# Patient Record
Sex: Female | Born: 1965 | Race: White | Hispanic: No | Marital: Married | Smoking: Never smoker
Health system: Southern US, Community
[De-identification: ages and names within clinical notes are randomized; demographics above are authoritative.]

## PROBLEM LIST (undated history)

## (undated) DIAGNOSIS — T7840XA Allergy, unspecified, initial encounter: Secondary | ICD-10-CM

## (undated) DIAGNOSIS — F419 Anxiety disorder, unspecified: Secondary | ICD-10-CM

## (undated) DIAGNOSIS — R87610 Atypical squamous cells of undetermined significance on cytologic smear of cervix (ASC-US): Secondary | ICD-10-CM

## (undated) DIAGNOSIS — K2270 Barrett's esophagus without dysplasia: Secondary | ICD-10-CM

## (undated) DIAGNOSIS — K219 Gastro-esophageal reflux disease without esophagitis: Secondary | ICD-10-CM

## (undated) DIAGNOSIS — R87619 Unspecified abnormal cytological findings in specimens from cervix uteri: Secondary | ICD-10-CM

## (undated) DIAGNOSIS — E785 Hyperlipidemia, unspecified: Secondary | ICD-10-CM

## (undated) HISTORY — DX: Atypical squamous cells of undetermined significance on cytologic smear of cervix (ASC-US): R87.610

## (undated) HISTORY — PX: APPENDECTOMY: SHX54

## (undated) HISTORY — DX: Anxiety disorder, unspecified: F41.9

## (undated) HISTORY — DX: Gastro-esophageal reflux disease without esophagitis: K21.9

## (undated) HISTORY — DX: Allergy, unspecified, initial encounter: T78.40XA

## (undated) HISTORY — DX: Barrett's esophagus without dysplasia: K22.70

## (undated) HISTORY — DX: Unspecified abnormal cytological findings in specimens from cervix uteri: R87.619

## (undated) HISTORY — DX: Hyperlipidemia, unspecified: E78.5

---

## 2008-03-16 ENCOUNTER — Emergency Department: Payer: Self-pay | Admitting: Emergency Medicine

## 2010-07-12 DIAGNOSIS — K2270 Barrett's esophagus without dysplasia: Secondary | ICD-10-CM

## 2010-07-12 HISTORY — DX: Barrett's esophagus without dysplasia: K22.70

## 2010-08-03 ENCOUNTER — Ambulatory Visit: Payer: Self-pay | Admitting: Gastroenterology

## 2010-08-04 LAB — PATHOLOGY REPORT

## 2011-01-09 ENCOUNTER — Ambulatory Visit
Admission: RE | Admit: 2011-01-09 | Discharge: 2011-01-09 | Payer: Self-pay | Source: Home / Self Care | Attending: Family Medicine | Admitting: Family Medicine

## 2011-01-09 ENCOUNTER — Encounter: Payer: Self-pay | Admitting: Family Medicine

## 2011-01-09 DIAGNOSIS — J019 Acute sinusitis, unspecified: Secondary | ICD-10-CM | POA: Insufficient documentation

## 2011-01-19 NOTE — Assessment & Plan Note (Signed)
Summary: SINUS INFECTION/JBB   Vital Signs:  Patient Profile:   45 Years Old Female CC:      head congestion, sinus pressure, headache, ears ache, body aches  x 2 days Height:     65 inches Weight:      152 pounds BMI:     25.39 O2 Sat:      100 % O2 treatment:    Room Air Pulse rate:   65 / minute Resp:     16 per minute BP sitting:   123 / 70  (left arm)  Vitals Entered By: Adella Hare LPN (January 09, 2011 3:17 PM)                  Prior Medication List:  No prior medications documented  Current Allergies: No known allergies History of Present Illness Chief Complaint: head congestion, sinus pressure, headache, ears ache, body aches  x 2 days History of Present Illness:  Patient  reports havingcongestion and coughing since Thursday. Fever and pressure behinds her eyes. She has had recurrent sinus infections before as well as Barrett's esphogitis.   Current Meds NEXIUM 40 MG CPDR (ESOMEPRAZOLE MAGNESIUM) one cap by mouth once daily LOESTRIN 24 FE 1-20 MG-MCG TABS (NORETHIN ACE-ETH ESTRAD-FE) daily CLARITIN 10 MG TABS (LORATADINE) as needed AUGMENTIN 875-125 MG TABS (AMOXICILLIN-POT CLAVULANATE) 1 by mouth 2 times daily  REVIEW OF SYSTEMS Constitutional Symptoms       Complains of fever and chills.  Eyes       Complains of eye pain, eye drainage, and glasses. Ear/Nose/Throat/Mouth       Complains of frequent runny nose and sinus problems.      Denies hearing loss/aids, change in hearing, ear pain, sore throat, and hoarseness.  Respiratory       Complains of dry cough.      Denies asthma and bronchitis.  Cardiovascular       Denies chest pain.    Gastrointestinal       Denies stomach pain and nausea/vomiting. Neurological       Complains of headaches. Musculoskeletal       Denies swelling.    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 Never Smoked Alcohol use-no Drug use-no Regular exercise-no Smoking Status:  never Drug Use:  no Does Patient Exercise:  no Physical Exam General appearance: well developed, well nourished, no acute distress Head: normocephalic, atraumatic Ears: normal, no lesions or deformities Nasal: pale, boggy, swollen nasal turbinates mild tendernesss over maxilary sinuses Oral/Pharynx: tongue normal, posterior pharynx without erythema or exudate Neck: supple,anterior lymphadenopathy present Extremities: normal extremities Skin: no obvious rashes or lesions MSE: oriented to time, place, and person Assessment New Problems: ACUTE SINUSITIS, UNSPECIFIED (ICD-461.9)  sinusitis early  Patient Education: Patient and/or caregiver instructed in the following: rest fluids and Tylenol.  Plan New Medications/Changes: AUGMENTIN 875-125 MG TABS (AMOXICILLIN-POT CLAVULANATE) 1 by mouth 2 times daily  #20 x 0, 01/09/2011, Hassan Rowan MD  New Orders: New Patient Level III [id number] Follow Up: Follow up in 2-3 days if no improvement, Follow up with Primary Physician Work/School Excuse: Return to work/school in 2 days  The patient and/or caregiver has been  counseled thoroughly with regard to medications prescribed including dosage, schedule, interactions, rationale for use, and possible side effects and they verbalize understanding.  Diagnoses and expected course of recovery discussed and will return if not improved as expected or if the condition worsens. Patient and/or caregiver verbalized understanding.  Prescriptions: AUGMENTIN 875-125 MG TABS (AMOXICILLIN-POT  CLAVULANATE) 1 by mouth 2 times daily  #20 x 0   Entered and Authorized by:   Hassan Rowan MD   Signed by:   Hassan Rowan MD on 01/09/2011   Method used:   Print then Give to Patient   RxID:   [id number]   Patient Instructions: 1)  Please schedule a follow-up appointment in 2 weeksw/PCP 2)  Please schedule an appointment with your primary doctor in :12 weeks sooner if needed. 3)  Take your antibiotic as prescribed until ALL of it is gone, but stop if you develop a rash or swelling and contact our office as soon as possible. 4)  Acute bronchitis symptoms for less than 10 days are not helped by antibiotics. take over the counter cough medications. call if no improvment in  5-7 days, sooner if increasing cough, fever, or new symptoms( shortness of breath, chest pain). 5)  Recommended remaining out of work for today and tomorrow. 6)  Continue w/claritinD and flonase that you have at home.  Orders Added: 1)  New Patient Level III [14782]

## 2011-01-19 NOTE — Letter (Signed)
Summary: Out of Work  [facility]  [address]   [place], [place] [address]   Phone: [phone]  Fax: [phone]    January 09, 2011   Employee:  [employer]    To Whom It May Concern:   For Medical reasons, please excuse the above named employee from work for the following dates:  Start:   01/09/2011  Return:   01/11/2011  If you need additional information, please feel free to contact our office.         Sincerely,    Hassan Rowan MD

## 2012-03-26 ENCOUNTER — Ambulatory Visit: Payer: Self-pay | Admitting: Family Medicine

## 2013-05-13 IMAGING — CR DG CHEST 2V
1 series · 2 of 2 positions shown · non-contrast
Comparison: none

REASON FOR EXAM: chronic cough
COMMENTS:

PROCEDURE:     NEYRA - NEYRA CHEST PA (OR AP) AND LAT  - March 26, 2012 [DATE]
RESULT:     Comparison: None

[Series 1: pa · 0.17mm/px · 2 of 2 slices shown]
[im 1/2]
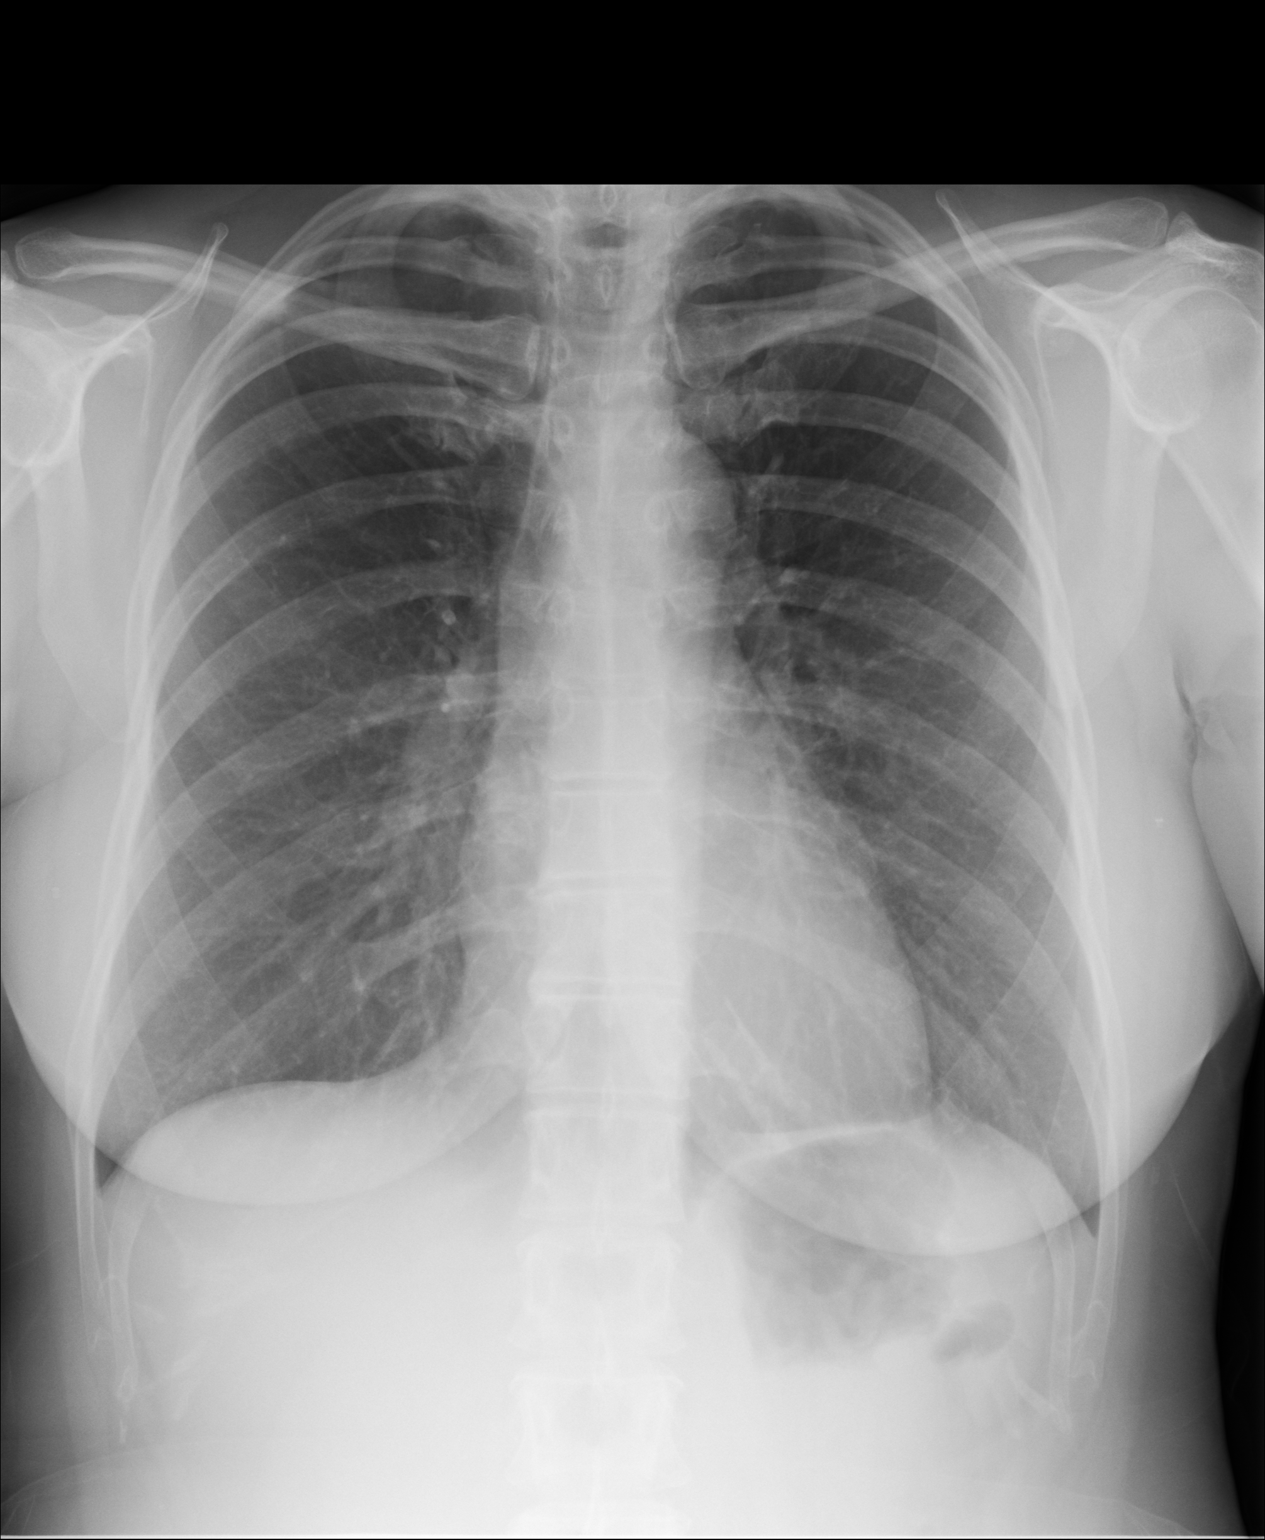
[im 2/2]
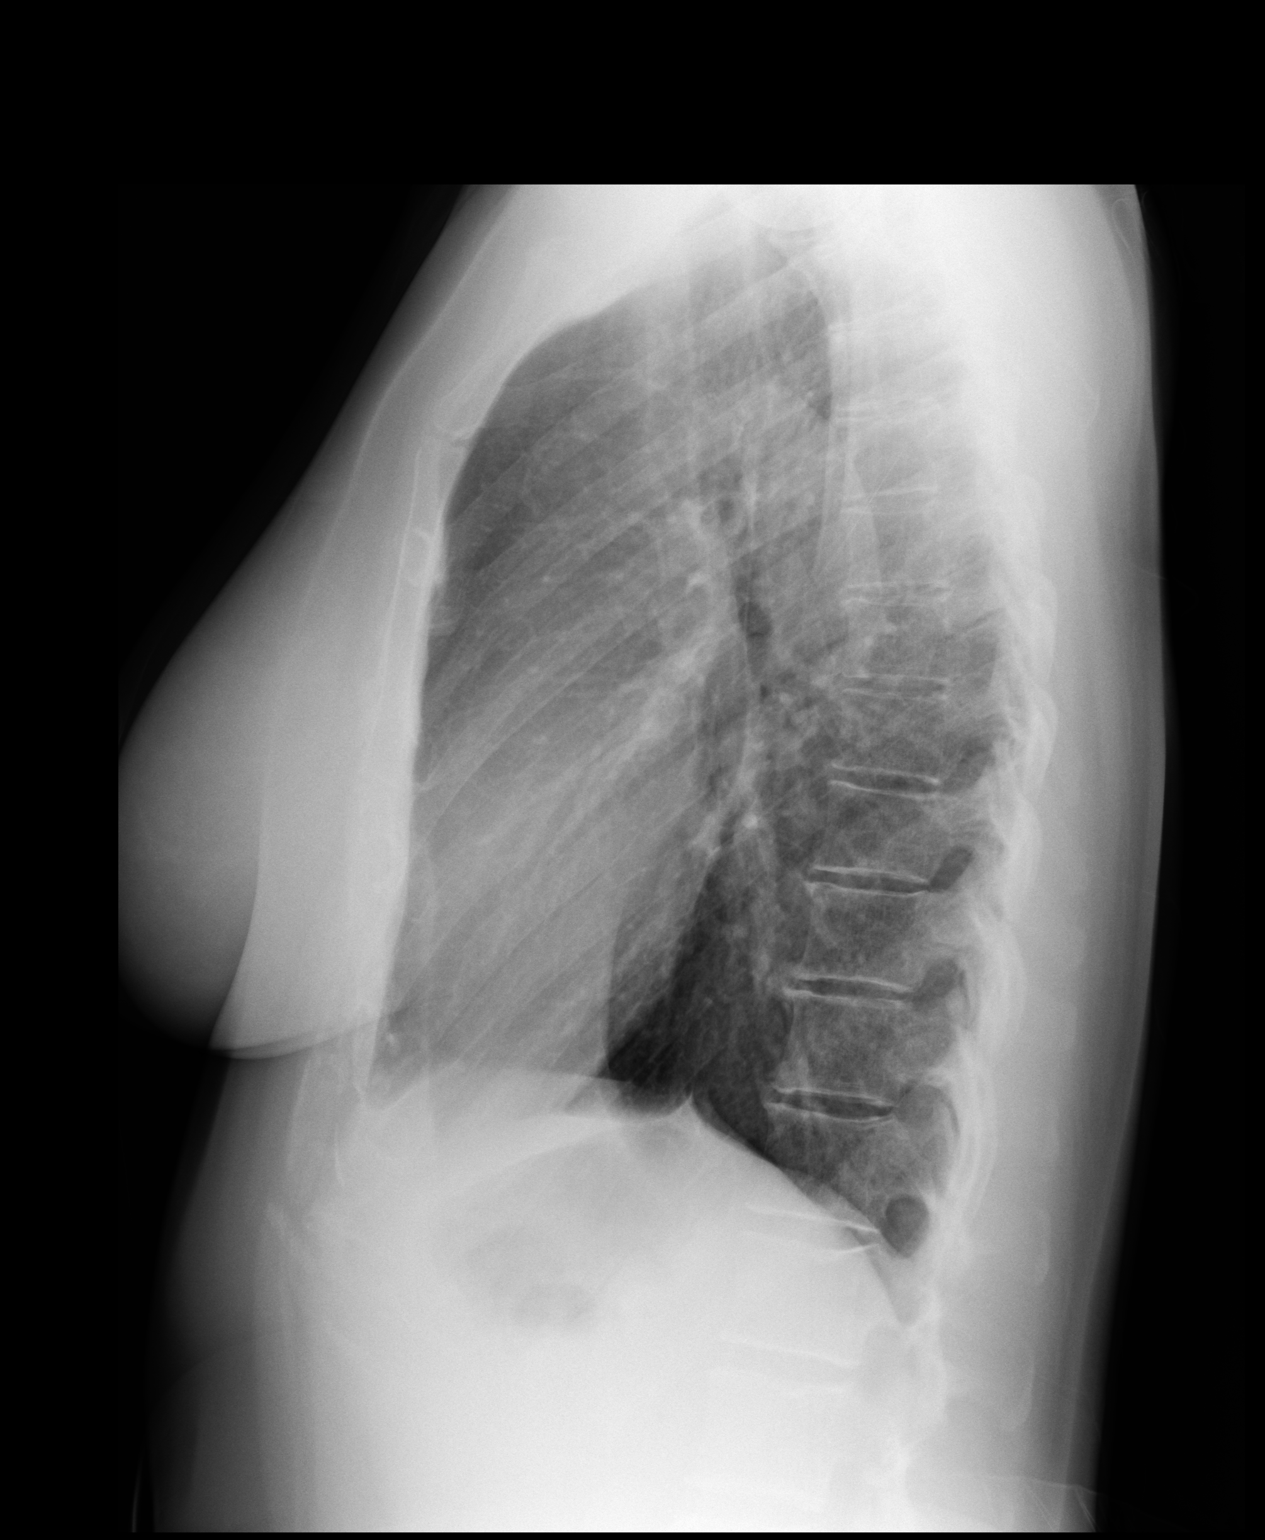

[2 of 2 positions shown; findings below may reference images not displayed]

FINDINGS: PA and lateral chest radiographs are provided.  There is no focal
parenchymal opacity, pleural effusion, or pneumothorax. The heart and
mediastinum are unremarkable.  The osseous structures are unremarkable.
IMPRESSION: No acute disease of the che[REDACTED]

## 2013-08-12 DIAGNOSIS — R87619 Unspecified abnormal cytological findings in specimens from cervix uteri: Secondary | ICD-10-CM

## 2013-08-12 HISTORY — DX: Unspecified abnormal cytological findings in specimens from cervix uteri: R87.619

## 2013-09-12 DIAGNOSIS — R87610 Atypical squamous cells of undetermined significance on cytologic smear of cervix (ASC-US): Secondary | ICD-10-CM

## 2013-09-12 HISTORY — DX: Atypical squamous cells of undetermined significance on cytologic smear of cervix (ASC-US): R87.610

## 2013-09-12 LAB — HM PAP SMEAR: HM PAP: NEGATIVE

## 2013-09-16 ENCOUNTER — Ambulatory Visit: Payer: Self-pay | Admitting: Family Medicine

## 2014-11-03 IMAGING — US TRANSABDOMINAL ULTRASOUND OF PELVIS
1 series · 13 of 25 positions shown · non-contrast
Comparison: none

REASON FOR EXAM: Right ovary tenderness to palpation
COMMENTS:

[Series 1: transabdominal ultrasound of pelvis · 0.28mm/px · 13 of 93 slices shown]
[im 1/93]
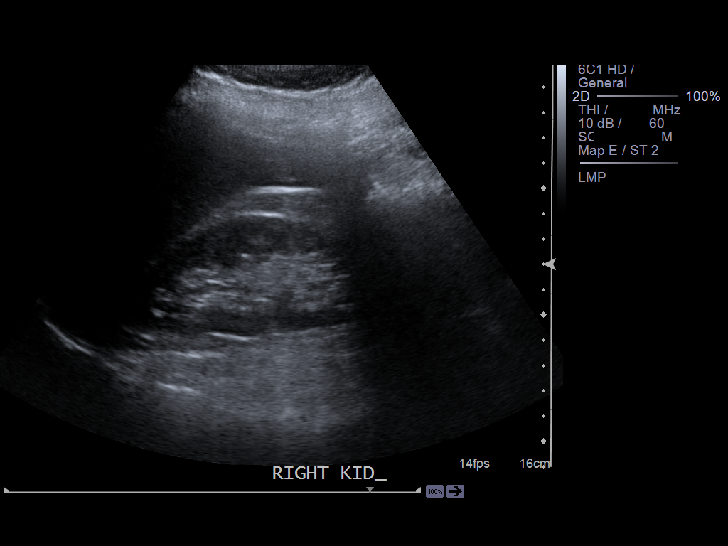
[im 8/93]
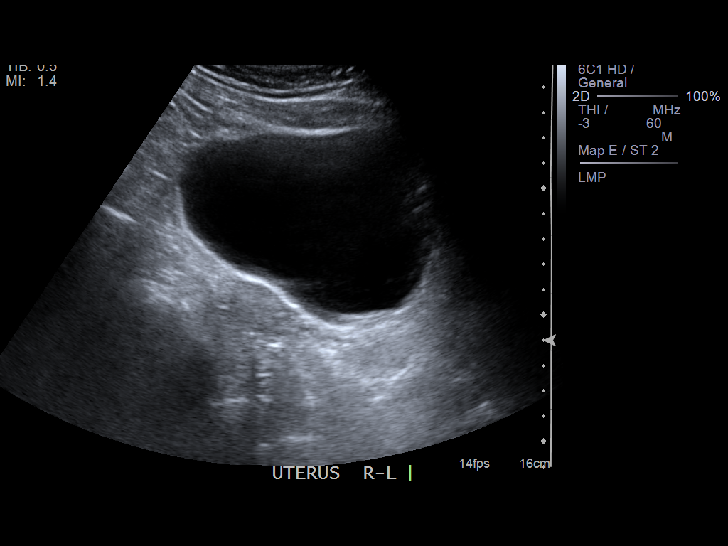
[im 16/93]
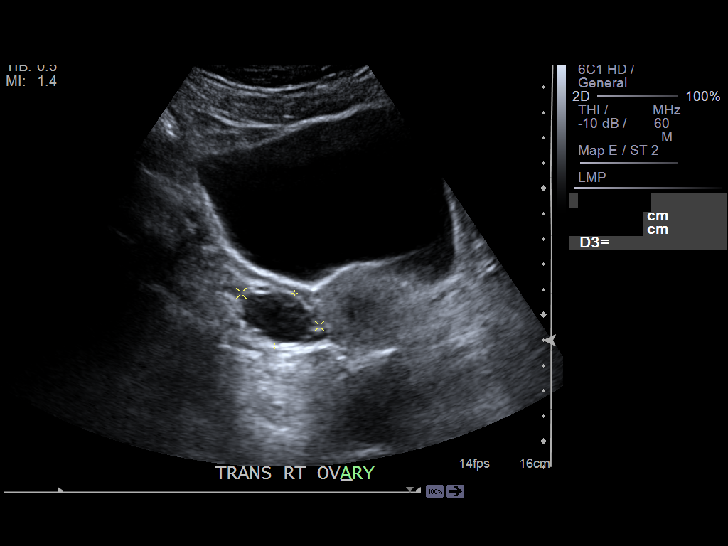
[im 24/93]
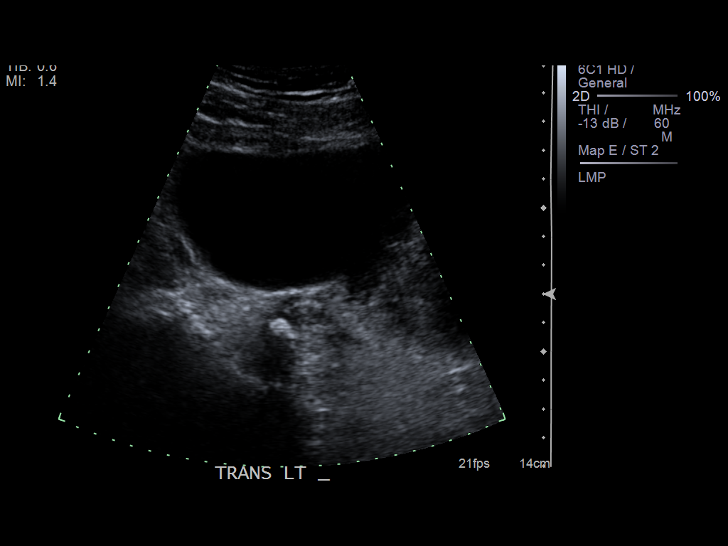
[im 31/93]
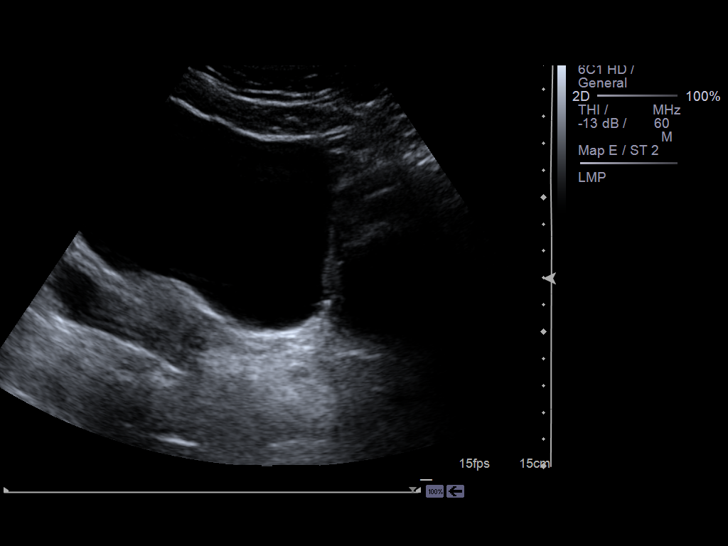
[im 39/93]
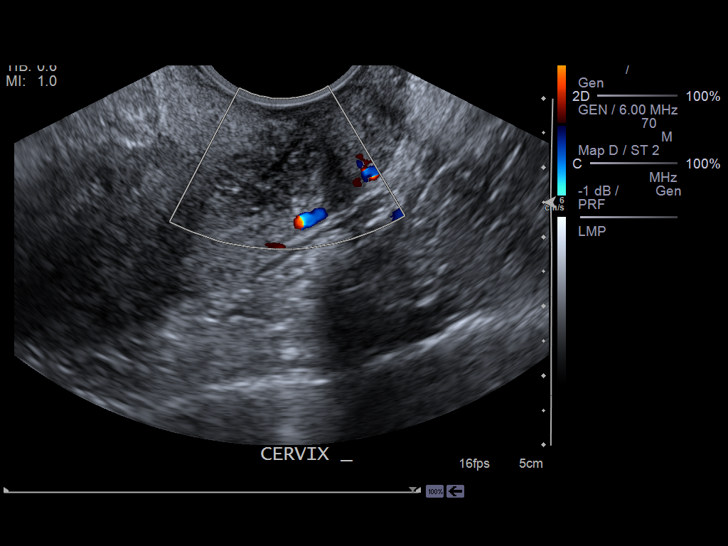
[im 47/93]
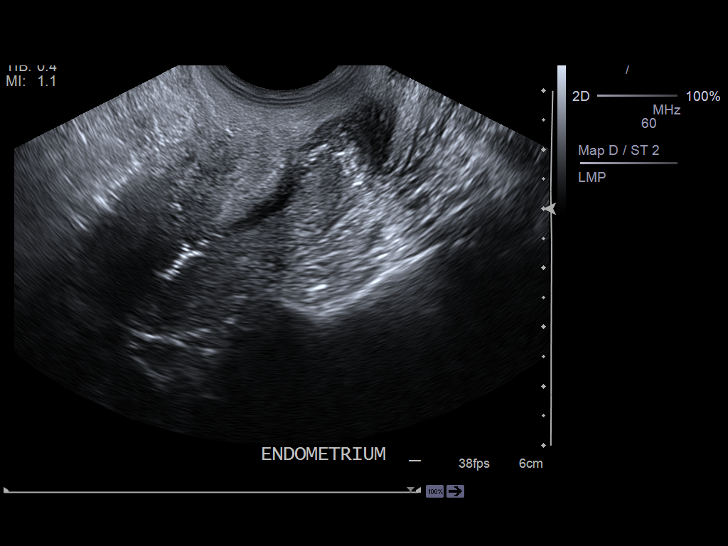
[im 54/93]
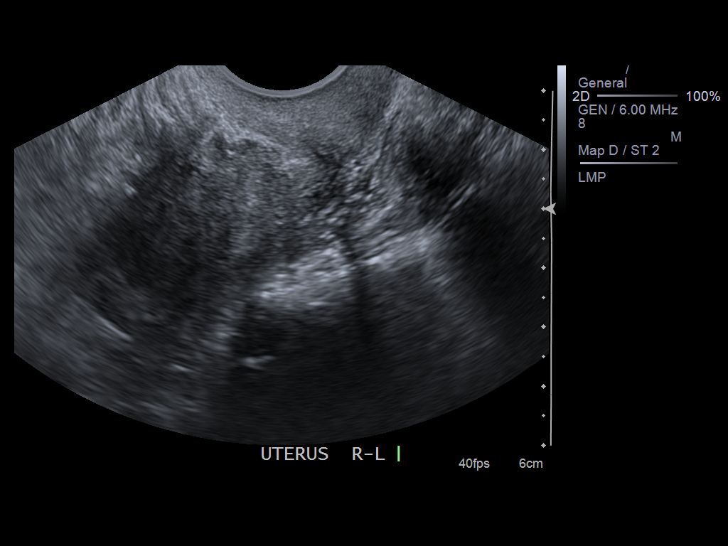
[im 62/93]
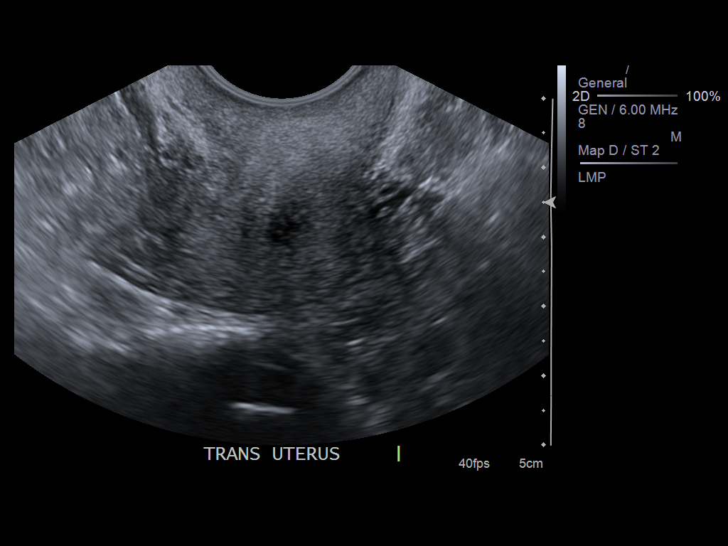
[im 70/93]
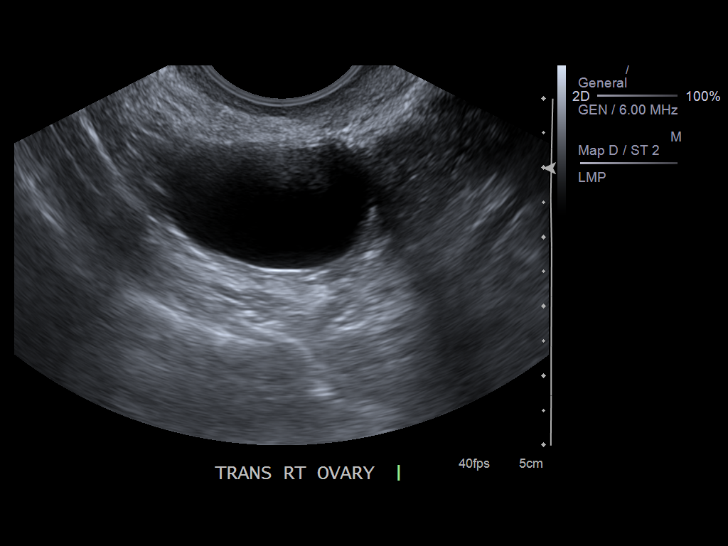
[im 77/93]
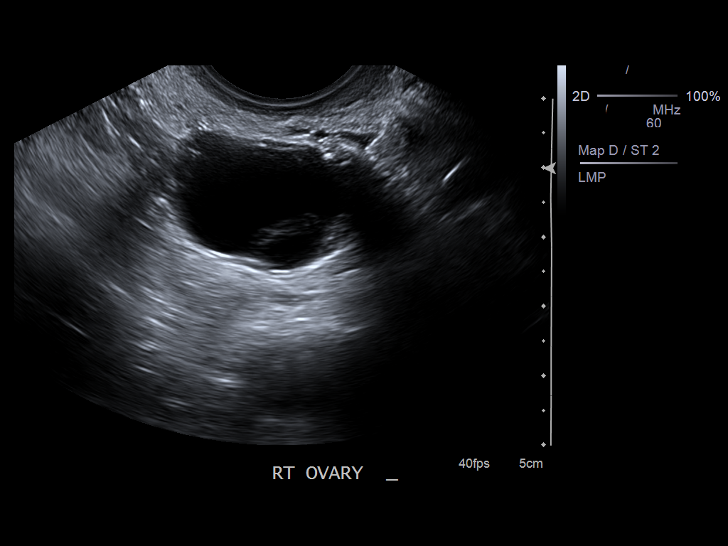
[im 85/93]
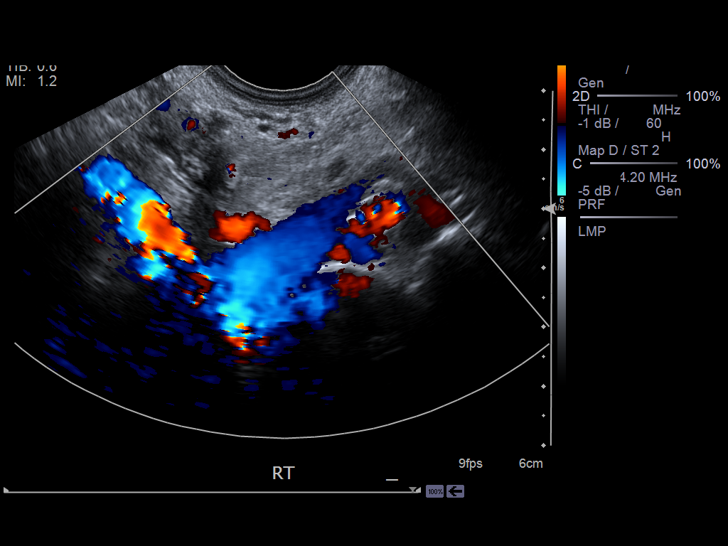
[im 93/93]
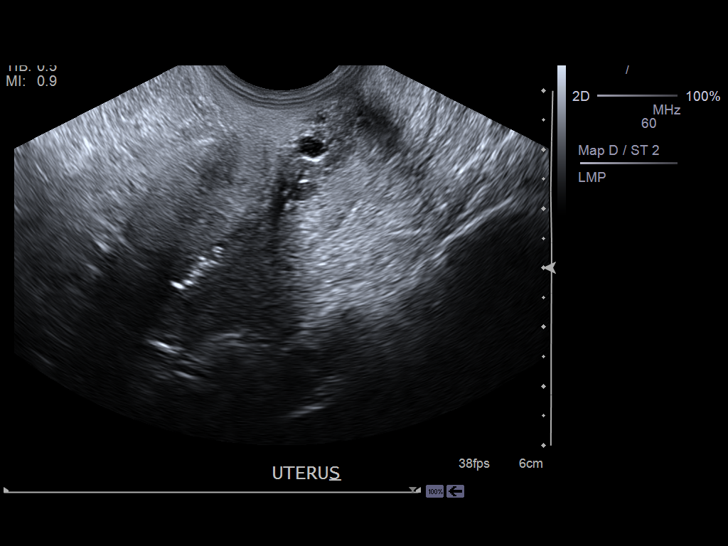

[13 of 25 positions shown; findings below may reference images not displayed]

PROCEDURE:     SALDES - SALDES PELVIS NON-OB W/TRANSVAGINAL  - September 16, 2013  [DATE]

RESULT:     Pelvic ultrasound is performed utilizing transabdominal and
endovaginal scanning. The kidneys appear grossly normal. The uterus measures
3888 x 3.91 x 2.90 cm with an endometrial stripe thickness measuring up to
8.8 mm. The right ovary measures 3.98 x 1.99 x 3.09 cm. The left ovary
measures 2.07 x 1.07 x 1.24 cm and was not seen on endovaginal scanning.
There is a minimal amount of fluid in the cul-de-sac. The uterus shows no
discrete masses. Nabothian cysts change noted in the cervix. Is a trace of
fluid in the cervix. The endometrium contains some echogenic nonshadowing
foci. There is a cyst in the right ovary with a single septation. The
overall size of this is 2.83 x 1.7 x 3.02 cm. The left ovary shows no solid
or cystic mass. Blood flow is documented with color Doppler images in both
ovaries.
IMPRESSION: 1. Septated cyst in the right ovary. Blood flow is seen in the right ovary
on endovaginal scanning. The color Doppler evidence of blood flow in the
left ovary on transabdominal scanning.
2. Nabothian cysts are seen.
3. Some echogenic nonshadowing material within the endometrium which is
nonspecific.

[REDACTED]

## 2014-11-25 LAB — HEPATIC FUNCTION PANEL
ALT: 16 U/L (ref 7–35)
AST: 16 U/L (ref 13–35)
Alkaline Phosphatase: 52 U/L (ref 25–125)

## 2014-11-25 LAB — BASIC METABOLIC PANEL
CREATININE: 0.9 mg/dL (ref 0.5–1.1)
Glucose: 84 mg/dL
POTASSIUM: 4.1 mmol/L (ref 3.4–5.3)

## 2014-11-25 LAB — LIPID PANEL
Cholesterol: 255 mg/dL — AB (ref 0–200)
HDL: 64 mg/dL (ref 35–70)
LDL CALC: 140 mg/dL
TRIGLYCERIDES: 256 mg/dL — AB (ref 40–160)

## 2014-11-25 LAB — TSH: TSH: 1.13 u[IU]/mL (ref 0.41–5.90)

## 2015-05-26 ENCOUNTER — Encounter: Payer: Self-pay | Admitting: Family Medicine

## 2015-05-26 DIAGNOSIS — K2270 Barrett's esophagus without dysplasia: Secondary | ICD-10-CM | POA: Insufficient documentation

## 2015-05-26 DIAGNOSIS — E782 Mixed hyperlipidemia: Secondary | ICD-10-CM

## 2015-05-26 DIAGNOSIS — R87610 Atypical squamous cells of undetermined significance on cytologic smear of cervix (ASC-US): Secondary | ICD-10-CM

## 2015-05-26 DIAGNOSIS — F411 Generalized anxiety disorder: Secondary | ICD-10-CM | POA: Insufficient documentation

## 2015-05-26 DIAGNOSIS — J302 Other seasonal allergic rhinitis: Secondary | ICD-10-CM

## 2015-05-26 DIAGNOSIS — E663 Overweight: Secondary | ICD-10-CM | POA: Insufficient documentation

## 2015-05-26 DIAGNOSIS — N951 Menopausal and female climacteric states: Secondary | ICD-10-CM | POA: Insufficient documentation

## 2015-05-27 ENCOUNTER — Ambulatory Visit (INDEPENDENT_AMBULATORY_CARE_PROVIDER_SITE_OTHER): Payer: BLUE CROSS/BLUE SHIELD | Admitting: Family Medicine

## 2015-05-27 ENCOUNTER — Encounter: Payer: Self-pay | Admitting: Family Medicine

## 2015-05-27 VITALS — BP 123/80 | HR 69 | Temp 98.6°F | Wt 170.0 lb

## 2015-05-27 DIAGNOSIS — M67471 Ganglion, right ankle and foot: Secondary | ICD-10-CM | POA: Diagnosis not present

## 2015-05-27 DIAGNOSIS — E782 Mixed hyperlipidemia: Secondary | ICD-10-CM | POA: Diagnosis not present

## 2015-05-27 DIAGNOSIS — E663 Overweight: Secondary | ICD-10-CM | POA: Diagnosis not present

## 2015-05-27 DIAGNOSIS — F411 Generalized anxiety disorder: Secondary | ICD-10-CM

## 2015-05-27 DIAGNOSIS — Z833 Family history of diabetes mellitus: Secondary | ICD-10-CM

## 2015-05-27 DIAGNOSIS — K2270 Barrett's esophagus without dysplasia: Secondary | ICD-10-CM

## 2015-05-27 DIAGNOSIS — Z5181 Encounter for therapeutic drug level monitoring: Secondary | ICD-10-CM

## 2015-05-27 DIAGNOSIS — M19041 Primary osteoarthritis, right hand: Secondary | ICD-10-CM | POA: Diagnosis not present

## 2015-05-27 NOTE — Assessment & Plan Note (Signed)
Increase activity and decrease total calories just a bit

## 2015-05-27 NOTE — Patient Instructions (Addendum)
Please let me know if you'd like to see the podiatrist about the knot on your foot and we'll be glad to make that referral for you Wear good supportive shoes when you stand for long periods of time Try turmeric as a natural anti-inflammatory (for pain and arthritis). It comes in capsules where you buy aspirin and fish oil, but also as a spice where you buy pepper and garlic powder. Check out the video for relaxation response, Dr. Sim Boast at [facility] Two minutes a day should be helpful Relaxation Response Video Exercise: Meditate with Peg Baim, MS, NP  We'll let you know your lab results and we'll decide if fish or krill oil would be beneficial You do NOT need niacin Do get enough iron, calcium, and magnesium in your diet (lentils, kale, spinach, broccoli, almond milk, beans, etc.)

## 2015-05-27 NOTE — Assessment & Plan Note (Signed)
Continue PPI, do get in enough foods rich in iron, calcium, magnesium

## 2015-05-27 NOTE — Assessment & Plan Note (Signed)
Relaxation response, consider yoga

## 2015-05-27 NOTE — Assessment & Plan Note (Signed)
Reviewed last lipid panel, high TC, high TG, and high LDL; check today and recs following

## 2015-05-27 NOTE — Progress Notes (Signed)
BP 123/80 mmHg  Pulse 69  Temp(Src) 98.6 F (37 C)  Wt 170 lb (77.111 kg)  SpO2 100%  LMP 11/11/2014 (Exact Date)   Subjective:    Patient ID: Stephanie Koch, female    DOB: [birth year], 49 y.o.   MRN: [id number]  HPI: Stephanie Koch is a 49 y.o. female  Chief Complaint  Patient presents with  . Hyperlipidemia  . 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; started that about 2 months ago, on the floor about a month ago too after the training, so they started about the same time; it doesn't hurt to touch, but when she puts her feet up on the couch then she'll have a pain go from the 5th MTP up the foot up to almost the ankle; standing is no pain, walking is no pain; feet might feel a little stiff, daughter says she lags behind a little; she has not tried anything on it, no meds; feels hard like a bone; has a knot on the right index finger, been there for a years  She has high cholesterol; does not eggs very often at all, once every two or three weeks; bacon for breakfast out; hot dogs occasionally; does eat a lot of cheese; she is not very active, but plans to do more exercise  Anxiety; medicine is doing a pretty good job; still has some anxiety; daughter away at school and she worries if she doesn't hear from her every day; nervous in the mornings; nauseated before work, has some reflux, worse before getting ready to work; she thinks she is an introvert at heart, and now she is a Holiday representative in public; social anxiety; does not like to be in crowded restaurants  Relevant past medical, surgical, family and social history reviewed and updated as indicated. Interim medical history since our last visit reviewed. Allergies and medications reviewed and updated.  Review of Systems  Per HPI unless specifically indicated above     Objective:    BP 123/80 mmHg   Pulse 69  Temp(Src) 98.6 F (37 C)  Wt 170 lb (77.111 kg)  SpO2 100%  LMP 11/11/2014 (Exact Date)  Wt Readings from Last 3 Encounters:  05/27/15 170 lb (77.111 kg)  11/25/14 165 lb (74.844 kg)  01/09/11 152 lb (68.947 kg)    Physical Exam  Constitutional: She appears well-developed and well-nourished. No distress.  HENT:  Head: Normocephalic and atraumatic.  Eyes: EOM are normal. No scleral icterus.  Neck: No thyromegaly present.  Cardiovascular: Normal rate, regular rhythm and normal heart sounds.   No murmur heard. Pulmonary/Chest: Effort normal and breath sounds normal. No respiratory distress. She has no wheezes.  Abdominal: Soft. Bowel sounds are normal. She exhibits no distension.  Musculoskeletal: Normal range of motion. She exhibits no edema.       Right ankle: She exhibits normal range of motion, no swelling, no ecchymosis and no deformity. Tenderness. CF ligament and proximal fibula tenderness found.       Right hand: She exhibits no bony tenderness.       Hands: Smooth nodular swelling over the lateral aspect right foot dorsal surface, transilluminates; no discoloration  Neurological: She is alert. She exhibits normal muscle tone.  Skin: Skin is warm and dry. She is not diaphoretic. No pallor.  Psychiatric: She has  a normal mood and affect. Her behavior is normal. Judgment and thought content normal.    Results for orders placed or performed in visit on 05/26/15  HM PAP SMEAR  Result Value Ref Range   HM Pap smear ASCUS, HPV negative   Basic metabolic panel  Result Value Ref Range   Glucose 84 mg/dL   Creatinine 0.9 0.5 - 1.1 mg/dL   Potassium 4.1 3.4 - 5.3 mmol/L  Lipid panel  Result Value Ref Range   Triglycerides 256 (A) 40 - 160 mg/dL   Cholesterol 161 (A) 0 - 200 mg/dL   HDL 64 35 - 70 mg/dL   LDL Cholesterol 096 mg/dL  Hepatic function panel  Result Value Ref Range   Alkaline Phosphatase 52 25 - 125 U/L   ALT 16 7 - 35 U/L   AST 16 13 - 35 U/L  TSH    Result Value Ref Range   TSH 1.13 0.41 - 5.90 uIU/mL      Assessment & Plan:   Problem List Items Addressed This Visit      Digestive   Barrett esophagus    Continue PPI, do get in enough foods rich in iron, calcium, magnesium        Other   Mixed hyperlipidemia    Reviewed last lipid panel, high TC, high TG, and high LDL; check today and recs following      Relevant Orders   Lipid Panel w/o Chol/HDL Ratio   Anxiety state    Relaxation response, consider yoga      Overweight (BMI 25.0-29.9)    Increase activity and decrease total calories just a bit       Other Visit Diagnoses    Ganglion cyst of right foot    -  Primary    supportive shoes encouraged; referral to podiatry offered, she politely declined but is welcome to call back if she changes her mind    Osteoarthritis of finger of right hand        suggested turmeric and tylenol    Medication monitoring encounter        Relevant Orders    Basic Metabolic Panel (BMET)    Family history of diabetes mellitus        Relevant Orders    Basic Metabolic Panel (BMET)        Follow up plan: No Follow-up on file.

## 2015-05-28 ENCOUNTER — Encounter: Payer: Self-pay | Admitting: Family Medicine

## 2015-05-28 LAB — BASIC METABOLIC PANEL
BUN / CREAT RATIO: 12 (ref 9–23)
BUN: 10 mg/dL (ref 6–24)
CO2: 26 mmol/L (ref 18–29)
Calcium: 9.3 mg/dL (ref 8.7–10.2)
Chloride: 97 mmol/L (ref 97–108)
Creatinine, Ser: 0.82 mg/dL (ref 0.57–1.00)
GFR calc Af Amer: 99 mL/min/{1.73_m2} (ref >59)
GFR calc non Af Amer: 85 mL/min/{1.73_m2} (ref >59)
Glucose: 93 mg/dL (ref 65–99)
Potassium: 4.2 mmol/L (ref 3.5–5.2)
SODIUM: 140 mmol/L (ref 134–144)

## 2015-05-28 LAB — LIPID PANEL W/O CHOL/HDL RATIO
Cholesterol, Total: 242 mg/dL — ABNORMAL HIGH (ref 100–199)
HDL: 57 mg/dL (ref >39)
LDL CALC: 145 mg/dL — AB (ref 0–99)
Triglycerides: 200 mg/dL — ABNORMAL HIGH (ref 0–149)
VLDL CHOLESTEROL CAL: 40 mg/dL (ref 5–40)

## 2015-07-06 ENCOUNTER — Other Ambulatory Visit: Payer: Self-pay | Admitting: Family Medicine

## 2015-11-25 ENCOUNTER — Ambulatory Visit: Payer: BLUE CROSS/BLUE SHIELD | Admitting: Family Medicine

## 2015-12-09 ENCOUNTER — Ambulatory Visit (INDEPENDENT_AMBULATORY_CARE_PROVIDER_SITE_OTHER): Payer: BLUE CROSS/BLUE SHIELD | Admitting: Family Medicine

## 2015-12-09 ENCOUNTER — Encounter: Payer: Self-pay | Admitting: Family Medicine

## 2015-12-09 VITALS — BP 126/76 | HR 62 | Temp 97.5°F | Ht 64.0 in | Wt 171.0 lb

## 2015-12-09 DIAGNOSIS — R87610 Atypical squamous cells of undetermined significance on cytologic smear of cervix (ASC-US): Secondary | ICD-10-CM

## 2015-12-09 DIAGNOSIS — N951 Menopausal and female climacteric states: Secondary | ICD-10-CM | POA: Diagnosis not present

## 2015-12-09 DIAGNOSIS — F411 Generalized anxiety disorder: Secondary | ICD-10-CM

## 2015-12-09 DIAGNOSIS — E663 Overweight: Secondary | ICD-10-CM | POA: Diagnosis not present

## 2015-12-09 DIAGNOSIS — J302 Other seasonal allergic rhinitis: Secondary | ICD-10-CM | POA: Diagnosis not present

## 2015-12-09 DIAGNOSIS — Z111 Encounter for screening for respiratory tuberculosis: Secondary | ICD-10-CM | POA: Insufficient documentation

## 2015-12-09 DIAGNOSIS — K2270 Barrett's esophagus without dysplasia: Secondary | ICD-10-CM | POA: Diagnosis not present

## 2015-12-09 NOTE — Assessment & Plan Note (Signed)
Return for pap smear in a few months with physical

## 2015-12-09 NOTE — Patient Instructions (Addendum)
You might try switching from fish oil to krill oil Try to limit saturated fats in your diet (bologna, hot dogs, barbeque, cheeseburgers, hamburgers, steak, bacon, sausage, cheese, etc.) and get more fresh fruits, vegetables, and whole grains Check out the information at familydoctor.org entitled "What It Takes to Lose Weight" Try to lose between 1-2 pounds per week by taking in fewer calories and burning off more calories You can succeed by limiting portions, limiting foods dense in calories and fat, becoming more active, and drinking 8 glasses of water a day (64 ounces) Don't skip meals, especially breakfast, as skipping meals may alter your metabolism Do not use over-the-counter weight loss pills or gimmicks that claim rapid weight loss A healthy BMI (or body mass index) is between 18.5 and 24.9 You can calculate your ideal BMI at the NIH website [employer].huhttp://www.nhlbi.nih.gov/health/educational/lose_wt/BMI/bmicalc.htm We'll shoot for 145 pounds over the next 26-52 weeks Return for fasting labs with your next physical in the next few months I will encourage you to contact you GI doctor and get set up for an appointment

## 2015-12-09 NOTE — Assessment & Plan Note (Signed)
Doing well; continue medicine

## 2015-12-09 NOTE — Assessment & Plan Note (Signed)
Work on weight loss; see AVS; calculated BMI with her

## 2015-12-09 NOTE — Assessment & Plan Note (Signed)
Taking SNRI for hot flashes

## 2015-12-09 NOTE — Assessment & Plan Note (Signed)
Avoid triggers; continue PPI

## 2015-12-09 NOTE — Progress Notes (Signed)
BP 126/76 mmHg  Pulse 62  Temp(Src) 97.5 F (36.4 C)  Ht  (1.626 m)  Wt 171 lb (77.565 kg)  BMI 29.34 kg/m2  SpO2 99%  LMP 06/09/2015 (Exact Date)   Subjective:    Patient ID: Stephanie Koch, female    DOB: 1966-12-04, 49 y.o.   MRN: [id number]  HPI: Stephanie Koch is a 49 y.o. female  Chief Complaint  Patient presents with  . Hyperlipidemia    follow up, started on fish oil BID at last visit  . Anxiety    doing much better since she got a new job  . form    Has forms for work to be completed and needs Quantum Feron Gold done   GAD 7 : Generalized Anxiety Score 12/09/2015  Nervous, Anxious, on Edge 1  Control/stop worrying 1  Worry too much - different things 0  Trouble relaxing 0  Restless 0  Easily annoyed or irritable 0  Afraid - awful might happen 1  Total GAD 7 Score 3  Anxiety Difficulty Not difficult at all   High cholesterol; we reviewed the last lipid panel; she taking the fish oil but having burping fishy taste; no more than 3 eggs a week; does like her cheese; not much fatty processed meat; avoids bacon and sausage; lots of vegetables; more exercise, 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 Hyperlipidemia   . Allergy   . Anxiety   . Barrett's esophagus 07/2010    EGD with biopsy confirmed  . Abnormal Pap smear of cervix 08/2013    ASCUS pap with HR HPV Neg. Needs repeat pap with cotesting 2017.   History reviewed. No pertinent past surgical history.  Family History  Problem Relation Age of Onset  . Diabetes Mother   . Hypertension Mother   . Hyperlipidemia Mother   . Stroke Father   . Hypertension Father   . Hyperlipidemia Father   . Cancer Paternal Uncle     bowel  . Diabetes Maternal Grandmother   . Heart disease  Maternal Grandmother   . Congestive Heart Failure Maternal Grandmother   . Cancer Maternal Grandfather     lung  . Stroke Paternal Grandfather   . Emphysema Paternal Grandmother   . COPD Neg Hx   (MD note, emphysema is a type of COPD, will leave above alone)  Relevant past medical, surgical, family and social history reviewed and updated as indicated. Interim medical history since our last visit reviewed. Allergies and medications reviewed and updated.  Review of Systems  Respiratory: Positive for cough (around passive smoke).   Per HPI unless specifically indicated above     Objective:    BP 126/76 mmHg  Pulse 62  Temp(Src) 97.5 F (36.4 C)  Ht  (1.626 m)  Wt 171 lb (77.565 kg)  BMI 29.34 kg/m2  SpO2 99%  LMP 06/09/2015 (Exact Date)  Wt Readings from Last 3 Encounters:  12/09/15 171 lb (77.565 kg)  05/27/15 170 lb (77.111 kg)  11/25/14 165 lb (74.844 kg)    Physical Exam  Constitutional: She appears well-developed and well-nourished. No distress.  Eyes: EOM are normal. No scleral icterus.  Cardiovascular: Normal rate and regular rhythm.   Pulmonary/Chest: Effort normal and breath sounds  normal.  Psychiatric: She has a normal mood and affect.      Assessment & Plan:   Problem List Items Addressed This Visit      Respiratory   Seasonal allergic rhinitis    Controlled with OTC antihistamine        Digestive   Barrett esophagus    Avoid triggers; continue PPI        Genitourinary   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    Return for pap smear in a few months with physical        Other   Anxiety state - Primary    Doing well; continue medicine      Menopausal state    Taking SNRI for hot flashes      Overweight (BMI 25.0-29.9)    Work on weight loss; see AVS; calculated BMI with her      Screening-pulmonary TB    Get quanitferon gold today      Relevant Orders   Quantiferon tb gold assay (blood) (Completed)       Follow up plan: Return 2-3 months, for complete physical (come fasting).  An after-visit summary was printed and given to the patient at check-out.  Please see the patient instructions which may contain other information and recommendations beyond what is mentioned above in the assessment and plan.  Face-to-face time with patient was more than 25 minutes, >50% time spent counseling and coordination of care

## 2015-12-09 NOTE — Assessment & Plan Note (Signed)
Get quanitferon gold today

## 2015-12-09 NOTE — Assessment & Plan Note (Signed)
Controlled with OTC antihistamine

## 2015-12-12 LAB — QUANTIFERON IN TUBE
QFT TB AG MINUS NIL VALUE: 0 IU/mL
QUANTIFERON MITOGEN VALUE: >10 IU/mL
QUANTIFERON TB AG VALUE: 0.03 [IU]/mL
QUANTIFERON TB GOLD: NEGATIVE
Quantiferon Nil Value: 0.03 IU/mL

## 2015-12-12 LAB — QUANTIFERON TB GOLD ASSAY (BLOOD)

## 2015-12-15 ENCOUNTER — Telehealth: Payer: Self-pay | Admitting: Family Medicine

## 2015-12-15 NOTE — Telephone Encounter (Signed)
Patient notified forms are ready to be picked up.

## 2015-12-15 NOTE — Telephone Encounter (Signed)
Patient called and wants to know if Dr. Sherie DonLada has filed out her paperwork she dropped off last Wednesday? Please call patient when ready.

## 2016-01-20 ENCOUNTER — Ambulatory Visit: Payer: BLUE CROSS/BLUE SHIELD | Admitting: Unknown Physician Specialty

## 2016-02-01 ENCOUNTER — Other Ambulatory Visit: Payer: Self-pay | Admitting: Family Medicine

## 2016-02-01 NOTE — Telephone Encounter (Signed)
rx approved

## 2016-02-24 ENCOUNTER — Encounter: Payer: BLUE CROSS/BLUE SHIELD | Admitting: Family Medicine

## 2016-03-02 ENCOUNTER — Encounter: Payer: BLUE CROSS/BLUE SHIELD | Admitting: Family Medicine

## 2016-03-07 ENCOUNTER — Encounter: Payer: Self-pay | Admitting: Family Medicine

## 2016-03-23 ENCOUNTER — Ambulatory Visit (INDEPENDENT_AMBULATORY_CARE_PROVIDER_SITE_OTHER): Payer: BLUE CROSS/BLUE SHIELD | Admitting: Family Medicine

## 2016-03-23 ENCOUNTER — Encounter: Payer: Self-pay | Admitting: Family Medicine

## 2016-03-23 VITALS — BP 128/86 | HR 80 | Temp 97.9°F | Resp 14 | Wt 165.0 lb

## 2016-03-23 DIAGNOSIS — Z0000 Encounter for general adult medical examination without abnormal findings: Secondary | ICD-10-CM | POA: Diagnosis not present

## 2016-03-23 DIAGNOSIS — G8929 Other chronic pain: Secondary | ICD-10-CM

## 2016-03-23 DIAGNOSIS — Z124 Encounter for screening for malignant neoplasm of cervix: Secondary | ICD-10-CM | POA: Diagnosis not present

## 2016-03-23 DIAGNOSIS — M79671 Pain in right foot: Secondary | ICD-10-CM

## 2016-03-23 DIAGNOSIS — R87610 Atypical squamous cells of undetermined significance on cytologic smear of cervix (ASC-US): Secondary | ICD-10-CM

## 2016-03-23 DIAGNOSIS — Z1239 Encounter for other screening for malignant neoplasm of breast: Secondary | ICD-10-CM | POA: Insufficient documentation

## 2016-03-23 NOTE — Progress Notes (Signed)
Patient ID: Stephanie Koch, female   DOB: Sep 16, 1966, 50 y.o.   MRN: [id number]   Subjective:   Stephanie Koch is a 50 y.o. female here for a complete physical exam  Interim issues since last visit: pain over the posterior right heel; going on for a long time, years; lately getting worse  USPSTF grade A and B recommendations Alcohol: n/a Depression:  Depression screen [facility] 2/9 03/23/2016  Decreased Interest 0  Down, Depressed, Hopeless 0  PHQ - 2 Score 0   Hypertension: controlled Obesity: she is trying to watch what she eats and exercise more; lost 6 pounds since last visit Tobacco use: n/a HIV: politely declined STD testing and prevention (chl/gon/syphilis): declined Lipids: check today (fasting); on fish oil Glucose: check today (fasting) Colorectal cancer: no first degree relatives; start at age 3 Breast cancer: order mammo Cervical cancer screening: today Fall prevention/vitamin D: recommend 1000 iu vit D Aspirin: n/a Diet: healthier Exercise: more Skin cancer: no worrisome moles  Past Medical History  Diagnosis Date  . Hyperlipidemia   . Allergy   . Anxiety   . Barrett's esophagus 07/2010    EGD with biopsy confirmed  . Abnormal Pap smear of cervix 08/2013    ASCUS pap with HR HPV Neg. Needs repeat pap with cotesting 2017.   No past surgical history on file. Family History  Problem Relation Age of Onset  . Diabetes Mother   . Hypertension Mother   . Hyperlipidemia Mother   . Stroke Father   . Hypertension Father   . Hyperlipidemia Father   . Cancer Paternal Uncle     bowel  . Diabetes Maternal Grandmother   . Heart disease Maternal Grandmother   . Congestive Heart Failure Maternal Grandmother   . Cancer Maternal Grandfather     lung  . Stroke Paternal Grandfather   . Emphysema Paternal Grandmother   . COPD Neg Hx    Social History  Substance Use Topics  . Smoking status: Never Smoker   . Smokeless tobacco: Never Used  . Alcohol Use: No    Review of Systems  Constitutional: Negative for unexpected weight change.  HENT: Negative for hearing loss.   Eyes: Negative for visual disturbance.  Respiratory: Negative for shortness of breath.   Cardiovascular: Negative for chest pain.  Gastrointestinal: Negative for abdominal pain.  Endocrine: Negative for polydipsia.  Genitourinary: Negative for hematuria, vaginal discharge and pelvic pain.  Skin:       No worrisome moles  Neurological: Negative for tremors.  Hematological: Does not bruise/bleed easily.  Psychiatric/Behavioral: Negative for dysphoric mood.    Objective:   Filed Vitals:   03/23/16 0824  BP: 128/86  Pulse: 80  Temp: 97.9 F (36.6 C)  TempSrc: Oral  Resp: 14  Weight: 165 lb (74.844 kg)  SpO2: 98%   Body mass index is 28.31 kg/(m^2). Wt Readings from Last 3 Encounters:  03/23/16 165 lb (74.844 kg)  12/09/15 171 lb (77.565 kg)  05/27/15 170 lb (77.111 kg)   Physical Exam  Constitutional: She appears well-developed and well-nourished.  HENT:  Head: Normocephalic and atraumatic.  Eyes: Conjunctivae and EOM are normal. Right eye exhibits no hordeolum. Left eye exhibits no hordeolum. No scleral icterus.  Neck: Carotid bruit is not present. No thyromegaly present.  Cardiovascular: Normal rate, regular rhythm, S1 normal, S2 normal and normal heart sounds.   No extrasystoles are present.  Pulmonary/Chest: Effort normal and breath sounds normal. No respiratory distress. Right breast exhibits no inverted nipple,  no mass, no nipple discharge, no skin change and no tenderness. Left breast exhibits no inverted nipple, no mass, no nipple discharge, no skin change and no tenderness. Breasts are symmetrical.  Abdominal: Soft. Normal appearance and bowel sounds are normal. She exhibits no distension, no abdominal bruit, no pulsatile midline mass and no mass. There is no hepatosplenomegaly. There is no tenderness. No hernia.  Genitourinary: Uterus normal. Pelvic exam  was performed with patient prone. There is no rash or lesion on the right labia. There is no rash or lesion on the left labia. Cervix exhibits no motion tenderness. Right adnexum displays no mass, no tenderness and no fullness. Left adnexum displays no mass, no tenderness and no fullness.  Musculoskeletal: Normal range of motion. She exhibits no edema.       Right foot: There is tenderness (plantar surface right heel, very focal).  Lymphadenopathy:       Head (right side): No submandibular adenopathy present.       Head (left side): No submandibular adenopathy present.    She has no cervical adenopathy.    She has no axillary adenopathy.  Neurological: She is alert. She displays no tremor. No cranial nerve deficit. She exhibits normal muscle tone. Gait normal.  Skin: Skin is warm and dry. No bruising and no ecchymosis noted. No cyanosis. No pallor.  Psychiatric: Her speech is normal and behavior is normal. Thought content normal. Her mood appears not anxious. She does not exhibit a depressed mood.   Assessment/Plan:   Problem List Items Addressed This Visit      Genitourinary   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    Repeat pap today      Relevant Orders   Pap liquid-based and HPV (high risk)     Other   Chronic pain of right heel    Suspect that she has a bone spur; will get xray; refer to podiatrist if needed      Relevant Orders   DG Foot Complete Right   Breast cancer screening    CBE done today; monthly SBE encourged, taught; yearly mammo      Relevant Orders   MM DIGITAL SCREENING BILATERAL   Cervical cancer screening    Pap collected today      Routine physical examination - Primary    USPSTF grade A and B recommendations reviewed with patient; age-appropriate recommendations, preventive care, screening tests, etc discussed and encouraged; healthy living encouraged; see AVS for patient education given to patient      Relevant Orders   CBC  with Differential/Platelet (Completed)   TSH (Completed)   Lipid Panel w/o Chol/HDL Ratio (Completed)   Comprehensive metabolic panel (Completed)       Meds ordered this encounter  Medications  . Omega-3 Fatty Acids (FISH OIL) 1000 MG CPDR    Sig: Take by mouth.   Orders Placed This Encounter  Procedures  . DG Foot Complete Right    Scheduling Instructions:     Swelling and pain over the posterior RIGHT heel; chronic    Order Specific Question:  Reason for Exam (SYMPTOM  OR DIAGNOSIS REQUIRED)    Answer:  pain and swelling over posterior RIGHT heel    Order Specific Question:  Is the patient pregnant?    Answer:  No    Order Specific Question:  Preferred imaging location?    Answer:  [facility]  . MM DIGITAL SCREENING BILATERAL    Standing Status: Future  Number of Occurrences:      Standing Expiration Date: 03/23/2017    Order Specific Question:  Reason for Exam (SYMPTOM  OR DIAGNOSIS REQUIRED)    Answer:  screening    Order Specific Question:  Is the patient pregnant?    Answer:  No    Order Specific Question:  Preferred imaging location?    Answer:  [facility]  . CBC with Differential/Platelet  . TSH  . Lipid Panel w/o Chol/HDL Ratio    Order Specific Question:  Has the patient fasted?    Answer:  Yes  . Comprehensive metabolic panel    Order Specific Question:  Has the patient fasted?    Answer:  Yes    Follow up plan: Return in about 1 year (around 03/23/2017) for complete physical.  An after-visit summary was printed and given to the patient at check-out.  Please see the patient instructions which may contain other information and recommendations beyond what is mentioned above in the assessment and plan.

## 2016-03-23 NOTE — Assessment & Plan Note (Signed)
Repeat pap today

## 2016-03-23 NOTE — Patient Instructions (Addendum)
Please do have an xray of your right foot Use ice topically 15-20 minutes at a time, 3-4 x a day; use cloth between skin and ice Aleve twice a day with food if it doesn't bother your stomach to help with heel pain If not getting better or if there is a spur, we'll have you see podiatry  Please do call to schedule your mammogram; the number to schedule one at either [facility] or [facility] is [phone]  Keep up the great efforts at weight loss, eating healthy foods and reasonable portions, and staying active  We'll get labs today and let you know those results soon If you have not heard anything from my staff in a week about any orders/referrals/studies from today, please contact us here to follow-up [phone]   Health Maintenance, Female Adopting a healthy lifestyle and getting preventive care can go a long way to promote health and wellness. Talk with your health care provider about what schedule of regular examinations is right for you. This is a good chance for you to check in with your provider about disease prevention and staying healthy. In between checkups, there are plenty of things you can do on your own. Experts have done a lot of research about which lifestyle changes and preventive measures are most likely to keep you healthy. Ask your health care provider for more information. WEIGHT AND DIET  Eat a healthy diet  Be sure to include plenty of vegetables, fruits, low-fat dairy products, and lean protein.  Do not eat a lot of foods high in solid fats, added sugars, or salt.  Get regular exercise. This is one of the most important things you can do for your health.  Most adults should exercise for at least 150 minutes each week. The exercise should increase your heart rate and make you sweat (moderate-intensity exercise).  Most adults should also do strengthening exercises at least twice a week. This is in addition to the moderate-intensity  exercise.  Maintain a healthy weight  Body mass index (BMI) is a measurement that can be used to identify possible weight problems. It estimates body fat based on height and weight. Your health care provider can help determine your BMI and help you achieve or maintain a healthy weight.  For females 71 years of age and older:   A BMI below 18.5 is considered underweight.  A BMI of 18.5 to 24.9 is normal.  A BMI of 25 to 29.9 is considered overweight.  A BMI of 30 and above is considered obese.  Watch levels of cholesterol and blood lipids  You should start having your blood tested for lipids and cholesterol at 49 years of age, then have this test every 5 years.  You may need to have your cholesterol levels checked more often if:  Your lipid or cholesterol levels are high.  You are older than 49 years of age.  You are at high risk for heart disease.  CANCER SCREENING   Lung Cancer  Lung cancer screening is recommended for adults 38-58 years old who are at high risk for lung cancer because of a history of smoking.  A yearly low-dose CT scan of the lungs is recommended for people who:  Currently smoke.  Have quit within the past 15 years.  Have at least a 30-pack-year history of smoking. A pack year is smoking an average of one pack of cigarettes a day for 1 year.  Yearly screening should continue until  it has been 15 years since you quit.  Yearly screening should stop if you develop a health problem that would prevent you from having lung cancer treatment.  Breast Cancer  Practice breast self-awareness. This means understanding how your breasts normally appear and feel.  It also means doing regular breast self-exams. Let your health care provider know about any changes, no matter how small.  If you are in your 20s or 30s, you should have a clinical breast exam (CBE) by a health care provider every 1-3 years as part of a regular health exam.  If you are 67 or  older, have a CBE every year. Also consider having a breast X-ray (mammogram) every year.  If you have a family history of breast cancer, talk to your health care provider about genetic screening.  If you are at high risk for breast cancer, talk to your health care provider about having an MRI and a mammogram every year.  Breast cancer gene (BRCA) assessment is recommended for women who have family members with BRCA-related cancers. BRCA-related cancers include:  Breast.  Ovarian.  Tubal.  Peritoneal cancers.  Results of the assessment will determine the need for genetic counseling and BRCA1 and BRCA2 testing. Cervical Cancer Your health care provider may recommend that you be screened regularly for cancer of the pelvic organs (ovaries, uterus, and vagina). This screening involves a pelvic examination, including checking for microscopic changes to the surface of your cervix (Pap test). You may be encouraged to have this screening done every 3 years, beginning at age 51.  For women ages 19-65, health care providers may recommend pelvic exams and Pap testing every 3 years, or they may recommend the Pap and pelvic exam, combined with testing for human papilloma virus (HPV), every 5 years. Some types of HPV increase your risk of cervical cancer. Testing for HPV may also be done on women of any age with unclear Pap test results.  Other health care providers may not recommend any screening for nonpregnant women who are considered low risk for pelvic cancer and who do not have symptoms. Ask your health care provider if a screening pelvic exam is right for you.  If you have had past treatment for cervical cancer or a condition that could lead to cancer, you need Pap tests and screening for cancer for at least 20 years after your treatment. If Pap tests have been discontinued, your risk factors (such as having a new sexual partner) need to be reassessed to determine if screening should resume. Some  women have medical problems that increase the chance of getting cervical cancer. In these cases, your health care provider may recommend more frequent screening and Pap tests. Colorectal Cancer  This type of cancer can be detected and often prevented.  Routine colorectal cancer screening usually begins at 49 years of age and continues through 49 years of age.  Your health care provider may recommend screening at an earlier age if you have risk factors for colon cancer.  Your health care provider may also recommend using home test kits to check for hidden blood in the stool.  A small camera at the end of a tube can be used to examine your colon directly (sigmoidoscopy or colonoscopy). This is done to check for the earliest forms of colorectal cancer.  Routine screening usually begins at age 46.  Direct examination of the colon should be repeated every 5-10 years through 49 years of age. However, you may need to be screened  more often if early forms of precancerous polyps or small growths are found. Skin Cancer  Check your skin from head to toe regularly.  Tell your health care provider about any new moles or changes in moles, especially if there is a change in a mole's shape or color.  Also tell your health care provider if you have a mole that is larger than the size of a pencil eraser.  Always use sunscreen. Apply sunscreen liberally and repeatedly throughout the day.  Protect yourself by wearing long sleeves, pants, a wide-brimmed hat, and sunglasses whenever you are outside. HEART DISEASE, DIABETES, AND HIGH BLOOD PRESSURE   High blood pressure causes heart disease and increases the risk of stroke. High blood pressure is more likely to develop in:  People who have blood pressure in the high end of the normal range (130-139/85-89 mm Hg).  People who are overweight or obese.  People who are African American.  If you are 37-65 years of age, have your blood pressure checked every  3-5 years. If you are 67 years of age or older, have your blood pressure checked every year. You should have your blood pressure measured twice--once when you are at a hospital or clinic, and once when you are not at a hospital or clinic. Record the average of the two measurements. To check your blood pressure when you are not at a hospital or clinic, you can use:  An automated blood pressure machine at a pharmacy.  A home blood pressure monitor.  If you are between 34 years and 3 years old, ask your health care provider if you should take aspirin to prevent strokes.  Have regular diabetes screenings. This involves taking a blood sample to check your fasting blood sugar level.  If you are at a normal weight and have a low risk for diabetes, have this test once every three years after 49 years of age.  If you are overweight and have a high risk for diabetes, consider being tested at a younger age or more often. PREVENTING INFECTION  Hepatitis B  If you have a higher risk for hepatitis B, you should be screened for this virus. You are considered at high risk for hepatitis B if:  You were born in a country where hepatitis B is common. Ask your health care provider which countries are considered high risk.  Your parents were born in a high-risk country, and you have not been immunized against hepatitis B (hepatitis B vaccine).  You have HIV or AIDS.  You use needles to inject street drugs.  You live with someone who has hepatitis B.  You have had sex with someone who has hepatitis B.  You get hemodialysis treatment.  You take certain medicines for conditions, including cancer, organ transplantation, and autoimmune conditions. Hepatitis C  Blood testing is recommended for:  Everyone born from [birth year] through 1964.  Anyone with known risk factors for hepatitis C. Sexually transmitted infections (STIs)  You should be screened for sexually transmitted infections (STIs) including  gonorrhea and chlamydia if:  You are sexually active and are younger than 49 years of age.  You are older than 49 years of age and your health care provider tells you that you are at risk for this type of infection.  Your sexual activity has changed since you were last screened and you are at an increased risk for chlamydia or gonorrhea. Ask your health care provider if you are at risk.  If you do not  have HIV, but are at risk, it may be recommended that you take a prescription medicine daily to prevent HIV infection. This is called pre-exposure prophylaxis (PrEP). You are considered at risk if:  You are sexually active and do not regularly use condoms or know the HIV status of your partner(s).  You take drugs by injection.  You are sexually active with a partner who has HIV. Talk with your health care provider about whether you are at high risk of being infected with HIV. If you choose to begin PrEP, you should first be tested for HIV. You should then be tested every 3 months for as long as you are taking PrEP.  PREGNANCY   If you are premenopausal and you may become pregnant, ask your health care provider about preconception counseling.  If you may become pregnant, take 400 to 800 micrograms (mcg) of folic acid every day.  If you want to prevent pregnancy, talk to your health care provider about birth control (contraception). OSTEOPOROSIS AND MENOPAUSE   Osteoporosis is a disease in which the bones lose minerals and strength with aging. This can result in serious bone fractures. Your risk for osteoporosis can be identified using a bone density scan.  If you are 83 years of age or older, or if you are at risk for osteoporosis and fractures, ask your health care provider if you should be screened.  Ask your health care provider whether you should take a calcium or vitamin D supplement to lower your risk for osteoporosis.  Menopause may have certain physical symptoms and  risks.  Hormone replacement therapy may reduce some of these symptoms and risks. Talk to your health care provider about whether hormone replacement therapy is right for you.  HOME CARE INSTRUCTIONS   Schedule regular health, dental, and eye exams.  Stay current with your immunizations.   Do not use any tobacco products including cigarettes, chewing tobacco, or electronic cigarettes.  If you are pregnant, do not drink alcohol.  If you are breastfeeding, limit how much and how often you drink alcohol.  Limit alcohol intake to no more than 1 drink per day for nonpregnant women. One drink equals 12 ounces of beer, 5 ounces of wine, or 1 ounces of hard liquor.  Do not use street drugs.  Do not share needles.  Ask your health care provider for help if you need support or information about quitting drugs.  Tell your health care provider if you often feel depressed.  Tell your health care provider if you have ever been abused or do not feel safe at home.   This information is not intended to replace advice given to you by your health care provider. Make sure you discuss any questions you have with your health care provider.   Document Released: 06/13/2011 Document Revised: 12/19/2014 Document Reviewed: 10/30/2013 Elsevier Interactive Patient Education [employer].

## 2016-03-24 LAB — CBC WITH DIFFERENTIAL/PLATELET
BASOS: 1 %
Basophils Absolute: 0 10*3/uL (ref 0.0–0.2)
EOS (ABSOLUTE): 0.1 10*3/uL (ref 0.0–0.4)
EOS: 2 %
HEMATOCRIT: 39 % (ref 34.0–46.6)
HEMOGLOBIN: 12.9 g/dL (ref 11.1–15.9)
IMMATURE GRANS (ABS): 0 10*3/uL (ref 0.0–0.1)
IMMATURE GRANULOCYTES: 0 %
Lymphocytes Absolute: 2.4 10*3/uL (ref 0.7–3.1)
Lymphs: 37 %
MCH: 31.3 pg (ref 26.6–33.0)
MCHC: 33.1 g/dL (ref 31.5–35.7)
MCV: 95 fL (ref 79–97)
MONOCYTES: 8 %
MONOS ABS: 0.5 10*3/uL (ref 0.1–0.9)
NEUTROS PCT: 52 %
Neutrophils Absolute: 3.4 10*3/uL (ref 1.4–7.0)
Platelets: 324 10*3/uL (ref 150–379)
RBC: 4.12 x10E6/uL (ref 3.77–5.28)
RDW: 14 % (ref 12.3–15.4)
WBC: 6.5 10*3/uL (ref 3.4–10.8)

## 2016-03-24 LAB — COMPREHENSIVE METABOLIC PANEL
A/G RATIO: 1.5 (ref 1.2–2.2)
ALK PHOS: 44 IU/L (ref 39–117)
ALT: 15 IU/L (ref 0–32)
AST: 18 IU/L (ref 0–40)
Albumin: 4.5 g/dL (ref 3.5–5.5)
BILIRUBIN TOTAL: 0.7 mg/dL (ref 0.0–1.2)
BUN/Creatinine Ratio: 19 (ref 9–23)
BUN: 15 mg/dL (ref 6–24)
CHLORIDE: 102 mmol/L (ref 96–106)
CO2: 24 mmol/L (ref 18–29)
Calcium: 9.8 mg/dL (ref 8.7–10.2)
Creatinine, Ser: 0.81 mg/dL (ref 0.57–1.00)
GFR calc non Af Amer: 86 mL/min/{1.73_m2} (ref >59)
GFR, EST AFRICAN AMERICAN: 99 mL/min/{1.73_m2} (ref >59)
GLUCOSE: 98 mg/dL (ref 65–99)
Globulin, Total: 3.1 g/dL (ref 1.5–4.5)
POTASSIUM: 5.1 mmol/L (ref 3.5–5.2)
Sodium: 142 mmol/L (ref 134–144)
TOTAL PROTEIN: 7.6 g/dL (ref 6.0–8.5)

## 2016-03-24 LAB — LIPID PANEL W/O CHOL/HDL RATIO
CHOLESTEROL TOTAL: 260 mg/dL — AB (ref 100–199)
HDL: 61 mg/dL (ref >39)
LDL CALC: 164 mg/dL — AB (ref 0–99)
TRIGLYCERIDES: 173 mg/dL — AB (ref 0–149)
VLDL CHOLESTEROL CAL: 35 mg/dL (ref 5–40)

## 2016-03-24 LAB — TSH: TSH: 1.2 u[IU]/mL (ref 0.450–4.500)

## 2016-03-27 LAB — PAP LB AND HPV HIGH-RISK
HPV, high-risk: NEGATIVE
PAP Smear Comment: 0

## 2016-04-13 DIAGNOSIS — Z0000 Encounter for general adult medical examination without abnormal findings: Secondary | ICD-10-CM | POA: Insufficient documentation

## 2016-04-13 NOTE — Assessment & Plan Note (Signed)
USPSTF grade A and B recommendations reviewed with patient; age-appropriate recommendations, preventive care, screening tests, etc discussed and encouraged; healthy living encouraged; see AVS for patient education given to patient  

## 2016-04-13 NOTE — Assessment & Plan Note (Signed)
Suspect that she has a bone spur; will get xray; refer to podiatrist if needed

## 2016-04-13 NOTE — Assessment & Plan Note (Signed)
Pap collected today. 

## 2016-04-13 NOTE — Assessment & Plan Note (Signed)
CBE done today; monthly SBE encourged, taught; yearly mammo

## 2016-07-30 ENCOUNTER — Other Ambulatory Visit: Payer: Self-pay | Admitting: Family Medicine

## 2016-08-24 ENCOUNTER — Ambulatory Visit (INDEPENDENT_AMBULATORY_CARE_PROVIDER_SITE_OTHER): Payer: BLUE CROSS/BLUE SHIELD | Admitting: Family Medicine

## 2016-08-24 ENCOUNTER — Encounter: Payer: Self-pay | Admitting: Family Medicine

## 2016-08-24 DIAGNOSIS — H60392 Other infective otitis externa, left ear: Secondary | ICD-10-CM | POA: Diagnosis not present

## 2016-08-24 DIAGNOSIS — J0100 Acute maxillary sinusitis, unspecified: Secondary | ICD-10-CM

## 2016-08-24 MED ORDER — AMOXICILLIN 500 MG PO TABS: 500.0000 mg | ORAL_TABLET | Freq: Two times a day (BID) | ORAL | 0 refills | Status: DC

## 2016-08-24 MED ORDER — CIPROFLOXACIN-DEXAMETHASONE 0.3-0.1 % OT SUSP: 4.0000 [drp] | Freq: Two times a day (BID) | OTIC | 0 refills | Status: AC

## 2016-08-24 NOTE — Progress Notes (Signed)
Name: Stephanie GibsonLeslie A Koch   MRN: [id number]    DOB: Dec 20, 1965   Date:08/24/2016       Progress Note  Subjective  Chief Complaint  Chief Complaint  Patient presents with  . Ear Pain    pressure and shooting pain, pt thinks it may be sinus related    Otalgia   There is pain in both ears. This is a new problem. The problem has been gradually worsening. There has been no fever. Associated symptoms include ear discharge (this morning experienced a clear discharge from her left ear.). Pertinent negatives include no hearing loss or sore throat.    Past Medical History:  Diagnosis Date  . Abnormal Pap smear of cervix 08/2013   ASCUS pap with HR HPV Neg. Needs repeat pap with cotesting 2017.  Marland Kitchen. Allergy   . Anxiety   . Barrett's esophagus 07/2010   EGD with biopsy confirmed  . Hyperlipidemia     History reviewed. No pertinent surgical history.  Family History  Problem Relation Age of Onset  . Diabetes Mother   . Hypertension Mother   . Hyperlipidemia Mother   . Stroke Father   . Hypertension Father   . Hyperlipidemia Father   . Cancer Paternal Uncle     bowel  . Diabetes Maternal Grandmother   . Heart disease Maternal Grandmother   . Congestive Heart Failure Maternal Grandmother   . Cancer Maternal Grandfather     lung  . Stroke Paternal Grandfather   . Emphysema Paternal Grandmother   . COPD Neg Hx     Social History   Social History  . Marital status: Married    Spouse name: N/A  . Number of children: N/A  . Years of education: N/A   Occupational History  . Not on file.   Social History Main Topics  . Smoking status: Never Smoker  . Smokeless tobacco: Never Used  . Alcohol use No  . Drug use: No  . Sexual activity: Not on file   Other Topics Concern  . Not on file   Social History Narrative  . No narrative on file     Current Outpatient Prescriptions:  .  esomeprazole (NEXIUM) 40 MG capsule, Take 40 mg by mouth daily at 12 noon., Disp: , Rfl:  .   loratadine (CLARITIN) 10 MG tablet, Take 10 mg by mouth daily., Disp: , Rfl:  .  Omega-3 Fatty Acids (FISH OIL) 1000 MG CPDR, Take by mouth., Disp: , Rfl:  .  Venlafaxine HCl 37.5 MG TB24, TAKE ONE TABLET BY MOUTH ONCE DAILY, Disp: 30 tablet, Rfl: 5  No Known Allergies   Review of Systems  Constitutional: Negative for chills and fever.  HENT: Positive for ear discharge (this morning experienced a clear discharge from her left ear.) and ear pain. Negative for hearing loss and sore throat.     Objective  Vitals:   08/24/16 0912  BP: 124/68  Pulse: 84  Resp: 17  Temp: 99.1 F (37.3 C)  TempSrc: Oral  SpO2: 96%  Weight: 162 lb 1.6 oz (73.5 kg)  Height: 5\' 4"  (1.626 m)    Physical Exam  Constitutional: She is well-developed, well-nourished, and in no distress.  HENT:  Head: Normocephalic and atraumatic.  Right Ear: Tympanic membrane and ear canal normal. No drainage or swelling.  Nose: No mucosal edema. Right sinus exhibits maxillary sinus tenderness. Left sinus exhibits maxillary sinus tenderness.  Mouth/Throat: Posterior oropharyngeal erythema present. No oropharyngeal exudate.  Left ear canal erythematous,  dried blood visible in the mid portion of the canal, TM gray but gives the appearance of dried fluid buildup  Nursing note and vitals reviewed.    Assessment & Plan  1. Acute infective otitis externa of left ear  - ciprofloxacin-dexamethasone (CIPRODEX) otic suspension; Place 4 drops into the left ear 2 (two) times daily.  Dispense: 2.8 mL; Refill: 0  2. Acute non-recurrent maxillary sinusitis  - amoxicillin (AMOXIL) 500 MG tablet; Take 1 tablet (500 mg total) by mouth 2 (two) times daily.  Dispense: 14 tablet; Refill: 0   Chenelle Benning Asad A. [facility] 08/24/2016 9:24 AM

## 2016-11-07 ENCOUNTER — Encounter: Payer: Self-pay | Admitting: Family Medicine

## 2016-11-07 ENCOUNTER — Ambulatory Visit (INDEPENDENT_AMBULATORY_CARE_PROVIDER_SITE_OTHER): Payer: BLUE CROSS/BLUE SHIELD | Admitting: Family Medicine

## 2016-11-07 VITALS — BP 104/68 | HR 75 | Temp 98.2°F | Resp 16 | Wt 162.0 lb

## 2016-11-07 DIAGNOSIS — R05 Cough: Secondary | ICD-10-CM | POA: Diagnosis not present

## 2016-11-07 DIAGNOSIS — R87610 Atypical squamous cells of undetermined significance on cytologic smear of cervix (ASC-US): Secondary | ICD-10-CM | POA: Diagnosis not present

## 2016-11-07 DIAGNOSIS — J069 Acute upper respiratory infection, unspecified: Secondary | ICD-10-CM

## 2016-11-07 DIAGNOSIS — R059 Cough, unspecified: Secondary | ICD-10-CM

## 2016-11-07 MED ORDER — BENZONATATE 100 MG PO CAPS: 100.0000 mg | ORAL_CAPSULE | Freq: Three times a day (TID) | ORAL | 0 refills | Status: AC | PRN

## 2016-11-07 NOTE — Progress Notes (Signed)
BP 104/68   Pulse 75   Temp 98.2 F (36.8 C) (Oral)   Resp 16   Wt 162 lb (73.5 kg)   LMP 11/14/2015 (Approximate)   SpO2 94%   BMI 27.81 kg/m    Subjective:    Patient ID: Stephanie GibsonLeslie A Behrendt, female    DOB: Jan 15, 1966, 50 y.o.   MRN: [id number]  HPI: Stephanie GibsonLeslie A Rafferty is a 50 y.o. female  Chief Complaint  Patient presents with  . 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 Abnormal Pap smear of cervix 08/2013   ASCUS pap with HR HPV Neg. Needs repeat pap with cotesting 2017.  Marland Kitchen. Allergy   . Anxiety   . Barrett's esophagus 07/2010   EGD with biopsy confirmed  . Hyperlipidemia    No past surgical history on file. Social History  Substance Use Topics  . Smoking status: Never Smoker  . Smokeless tobacco: Never Used  . Alcohol use No   Interim medical history since last visit reviewed. Allergies and medications reviewed  Review of Systems Per HPI unless specifically indicated above     Objective:    BP 104/68   Pulse 75   Temp 98.2 F (36.8 C) (Oral)   Resp 16   Wt 162 lb (73.5 kg)   LMP 11/14/2015 (Approximate)   SpO2 94%   BMI 27.81 kg/m   Wt Readings from Last 3 Encounters:  11/07/16 162 lb (73.5 kg)  08/24/16 162 lb 1.6 oz (73.5 kg)  03/23/16 165 lb (74.8 kg)    Physical Exam  Constitutional: She appears well-developed and well-nourished.  HENT:  Right Ear: Hearing, external ear and ear canal  normal. A middle ear effusion is present.  Left Ear: Hearing, external ear and ear canal normal. A middle ear effusion is present.  Nose: Rhinorrhea present.  Mouth/Throat: Oropharynx is clear and moist and mucous membranes are normal.  Eyes: EOM are normal. No scleral icterus.  Cardiovascular: Normal rate and regular rhythm.   Pulmonary/Chest: Effort normal and breath sounds normal.  Lymphadenopathy:    She has no cervical adenopathy.  Skin: No rash noted. She is not diaphoretic. No pallor.  Psychiatric: She has a normal mood and affect. Her behavior is normal.      Assessment & Plan:   Problem List Items Addressed This Visit      Genitourinary   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    Reviewed chart while in problem list; April 2017 pap report NIL, neg HPV       Other Visit Diagnoses    Upper respiratory tract infection, unspecified type    -  Primary   rest, hydration, green tea; echinacea suggested; no need for antibiotics with current illness; infectious, so take measures to prevent spread   Cough       associated with  URI; tessalon perles Rx'd; no need for CXR or antibiotics at this time      Follow up plan: No Follow-up on file.  An after-visit summary was printed and given to the patient at check-out.  Please see the patient instructions which may contain other information and recommendations beyond what is mentioned above in the assessment and plan.  Meds ordered this encounter  Medications  . benzonatate (TESSALON PERLES) 100 MG capsule    Sig: Take 1 capsule (100 mg total) by mouth every 8 (eight) hours as needed for cough.    Dispense:  30 capsule    Refill:  0

## 2016-11-07 NOTE — Patient Instructions (Addendum)
Drink green tea to help your immune system during your illness Get plenty of rest and hydration Consider Echinacea for immune system boosting Avoid zinc nasal sprays Use the tessalon perles if needed for cough

## 2016-11-08 NOTE — Assessment & Plan Note (Signed)
Reviewed chart while in problem list; April 2017 pap report NIL, neg HPV

## 2016-12-06 ENCOUNTER — Encounter: Payer: Self-pay | Admitting: Family Medicine

## 2016-12-06 ENCOUNTER — Ambulatory Visit (INDEPENDENT_AMBULATORY_CARE_PROVIDER_SITE_OTHER): Payer: BLUE CROSS/BLUE SHIELD | Admitting: Family Medicine

## 2016-12-06 DIAGNOSIS — N3001 Acute cystitis with hematuria: Secondary | ICD-10-CM | POA: Diagnosis not present

## 2016-12-06 DIAGNOSIS — N390 Urinary tract infection, site not specified: Secondary | ICD-10-CM | POA: Insufficient documentation

## 2016-12-06 MED ORDER — NITROFURANTOIN MONOHYD MACRO 100 MG PO CAPS: 100.0000 mg | ORAL_CAPSULE | Freq: Two times a day (BID) | ORAL | 0 refills | Status: DC

## 2016-12-06 NOTE — Progress Notes (Signed)
Name: Eppie GibsonLeslie A Nabi   MRN: [id number]    DOB: 20-Mar-1966   Date:12/06/2016       Progress Note  Subjective  Chief Complaint  Chief Complaint  Patient presents with  . Urinary Tract Infection    started today    Urinary Tract Infection   This is a new problem. The current episode started today. The quality of the pain is described as burning and aching. There has been no fever. She is sexually active. Associated symptoms include frequency, hesitancy and urgency. Treatments tried: Has taken Azo this morning.    Past Medical History:  Diagnosis Date  . Abnormal Pap smear of cervix 08/2013   ASCUS pap with HR HPV Neg. Needs repeat pap with cotesting 2017.  Marland Kitchen. Allergy   . Anxiety   . Barrett's esophagus 07/2010   EGD with biopsy confirmed  . Hyperlipidemia     No past surgical history on file.  Family History  Problem Relation Age of Onset  . Diabetes Mother   . Hypertension Mother   . Hyperlipidemia Mother   . Stroke Father   . Hypertension Father   . Hyperlipidemia Father   . Cancer Paternal Uncle     bowel  . Diabetes Maternal Grandmother   . Heart disease Maternal Grandmother   . Congestive Heart Failure Maternal Grandmother   . Cancer Maternal Grandfather     lung  . Stroke Paternal Grandfather   . Emphysema Paternal Grandmother   . COPD Neg Hx     Social History   Social History  . Marital status: Married    Spouse name: N/A  . Number of children: N/A  . Years of education: N/A   Occupational History  . Not on file.   Social History Main Topics  . Smoking status: Never Smoker  . Smokeless tobacco: Never Used  . Alcohol use No  . Drug use: No  . Sexual activity: Not on file   Other Topics Concern  . Not on file   Social History Narrative  . No narrative on file     Current Outpatient Prescriptions:  .  esomeprazole (NEXIUM) 40 MG capsule, Take 40 mg by mouth daily at 12 noon., Disp: , Rfl:  .  loratadine (CLARITIN) 10 MG tablet, Take 10 mg  by mouth daily., Disp: , Rfl:  .  Omega-3 Fatty Acids (FISH OIL) 1000 MG CPDR, Take by mouth., Disp: , Rfl:  .  Venlafaxine HCl 37.5 MG TB24, TAKE ONE TABLET BY MOUTH ONCE DAILY, Disp: 30 tablet, Rfl: 5  No Known Allergies   Review of Systems  Genitourinary: Positive for frequency, hesitancy and urgency.     Objective  Vitals:   12/06/16 1531  BP: 130/80  Pulse: 68  Resp: 16  Temp: 98.3 F (36.8 C)  TempSrc: Oral  SpO2: 97%  Weight: 166 lb 4.8 oz (75.4 kg)  Height: 5\' 4"  (1.626 m)    Physical Exam  Constitutional: She is oriented to person, place, and time and well-developed, well-nourished, and in no distress.  HENT:  Head: Normocephalic and atraumatic.  Cardiovascular: Normal rate, regular rhythm and normal heart sounds.   No murmur heard. Pulmonary/Chest: Effort normal and breath sounds normal. She has no wheezes.  Abdominal: Soft. Bowel sounds are normal. There is no hepatosplenomegaly. There is tenderness in the suprapubic area. There is no rebound and no CVA tenderness.  Neurological: She is alert and oriented to person, place, and time.  Nursing note and vitals reviewed.  Assessment & Plan  1. Acute cystitis with hematuria Point-of-care urinalysis could not be completed because patient has taken Azo, started on antimicrobial therapy based on symptoms, advised to return tomorrow morning for fresh urine for UA, micro and culture. - nitrofurantoin, macrocrystal-monohydrate, (MACROBID) 100 MG capsule; Take 1 capsule (100 mg total) by mouth 2 (two) times daily.  Dispense: 14 capsule; Refill: 0 - Urinalysis, Routine w reflex microscopic - Urine Culture   Mavery Milling Asad A. [facility] 12/06/2016 3:47 PM

## 2016-12-07 LAB — URINALYSIS, ROUTINE W REFLEX MICROSCOPIC
Bilirubin Urine: NEGATIVE
Glucose, UA: NEGATIVE
Nitrite: POSITIVE — AB
Protein, ur: NEGATIVE
Specific Gravity, Urine: 1.02 (ref 1.001–1.035)
pH: 5 (ref 5.0–8.0)

## 2016-12-07 LAB — URINALYSIS, MICROSCOPIC ONLY
Bacteria, UA: NONE SEEN [HPF]
Casts: NONE SEEN [LPF]
Crystals: NONE SEEN [HPF]
Yeast: NONE SEEN [HPF]

## 2016-12-09 ENCOUNTER — Other Ambulatory Visit: Payer: Self-pay | Admitting: Family Medicine

## 2016-12-09 DIAGNOSIS — N3001 Acute cystitis with hematuria: Secondary | ICD-10-CM

## 2016-12-09 LAB — URINE CULTURE

## 2016-12-09 MED ORDER — AMOXICILLIN-POT CLAVULANATE 500-125 MG PO TABS: 1.0000 | ORAL_TABLET | Freq: Two times a day (BID) | ORAL | 0 refills | Status: DC

## 2016-12-09 NOTE — Progress Notes (Unsigned)
Urine culture shows group B strep, susceptible to penicillins. Started on amoxicillin-clavulanate 500-1 25 mg twice daily with meals, DC nitrofurantoin. Prescription sent to pharmacy

## 2017-01-26 ENCOUNTER — Other Ambulatory Visit: Payer: Self-pay | Admitting: Family Medicine

## 2017-01-27 ENCOUNTER — Other Ambulatory Visit: Payer: Self-pay | Admitting: Family Medicine

## 2017-01-27 MED ORDER — VENLAFAXINE HCL ER 37.5 MG PO TB24: 1.0000 | ORAL_TABLET | Freq: Every day | ORAL | 3 refills | Status: DC

## 2017-01-27 NOTE — Telephone Encounter (Signed)
Note is blank 

## 2017-01-27 NOTE — Telephone Encounter (Signed)
Pt needs refill on Venlafaxine. Pt is completely out of this medication. [facility] [address]

## 2017-01-27 NOTE — Telephone Encounter (Signed)
Pharmacy changed. Rx sent.  ?

## 2017-03-27 ENCOUNTER — Ambulatory Visit (INDEPENDENT_AMBULATORY_CARE_PROVIDER_SITE_OTHER): Payer: BLUE CROSS/BLUE SHIELD | Admitting: Family Medicine

## 2017-03-27 ENCOUNTER — Encounter: Payer: Self-pay | Admitting: Family Medicine

## 2017-03-27 ENCOUNTER — Other Ambulatory Visit: Payer: Self-pay | Admitting: Family Medicine

## 2017-03-27 VITALS — BP 128/76 | HR 75 | Temp 97.7°F | Resp 14 | Ht 63.5 in | Wt 169.2 lb

## 2017-03-27 DIAGNOSIS — Z0001 Encounter for general adult medical examination with abnormal findings: Secondary | ICD-10-CM | POA: Diagnosis not present

## 2017-03-27 DIAGNOSIS — Z124 Encounter for screening for malignant neoplasm of cervix: Secondary | ICD-10-CM

## 2017-03-27 DIAGNOSIS — G8929 Other chronic pain: Secondary | ICD-10-CM | POA: Diagnosis not present

## 2017-03-27 DIAGNOSIS — M79671 Pain in right foot: Secondary | ICD-10-CM | POA: Diagnosis not present

## 2017-03-27 DIAGNOSIS — Z1239 Encounter for other screening for malignant neoplasm of breast: Secondary | ICD-10-CM

## 2017-03-27 DIAGNOSIS — M7660 Achilles tendinitis, unspecified leg: Secondary | ICD-10-CM

## 2017-03-27 DIAGNOSIS — Z0000 Encounter for general adult medical examination without abnormal findings: Secondary | ICD-10-CM | POA: Insufficient documentation

## 2017-03-27 LAB — CBC WITH DIFFERENTIAL/PLATELET
BASOS PCT: 1 %
Basophils Absolute: 63 cells/uL (ref 0–200)
EOS ABS: 126 {cells}/uL (ref 15–500)
Eosinophils Relative: 2 %
HEMATOCRIT: 36.9 % (ref 35.0–45.0)
Hemoglobin: 11.9 g/dL (ref 11.7–15.5)
LYMPHS PCT: 21 %
Lymphs Abs: 1323 cells/uL (ref 850–3900)
MCH: 30.2 pg (ref 27.0–33.0)
MCHC: 32.2 g/dL (ref 32.0–36.0)
MCV: 93.7 fL (ref 80.0–100.0)
MONO ABS: 693 {cells}/uL (ref 200–950)
MPV: 10.7 fL (ref 7.5–12.5)
Monocytes Relative: 11 %
Neutro Abs: 4095 cells/uL (ref 1500–7800)
Neutrophils Relative %: 65 %
Platelets: 282 10*3/uL (ref 140–400)
RBC: 3.94 MIL/uL (ref 3.80–5.10)
RDW: 14.1 % (ref 11.0–15.0)
WBC: 6.3 10*3/uL (ref 3.8–10.8)

## 2017-03-27 LAB — COMPLETE METABOLIC PANEL WITH GFR
ALT: 12 U/L (ref 6–29)
AST: 15 U/L (ref 10–35)
Albumin: 4.1 g/dL (ref 3.6–5.1)
Alkaline Phosphatase: 43 U/L (ref 33–115)
BUN: 15 mg/dL (ref 7–25)
CHLORIDE: 106 mmol/L (ref 98–110)
CO2: 29 mmol/L (ref 20–31)
CREATININE: 0.76 mg/dL (ref 0.50–1.10)
Calcium: 9.2 mg/dL (ref 8.6–10.2)
GFR, Est African American: >89 mL/min (ref >=60)
GFR, Est Non African American: >89 mL/min (ref >=60)
Glucose, Bld: 83 mg/dL (ref 65–99)
Potassium: 4.2 mmol/L (ref 3.5–5.3)
Sodium: 143 mmol/L (ref 135–146)
Total Bilirubin: 0.8 mg/dL (ref 0.2–1.2)
Total Protein: 7 g/dL (ref 6.1–8.1)

## 2017-03-27 LAB — LIPID PANEL
CHOL/HDL RATIO: 4.5 ratio (ref <5.0)
Cholesterol: 241 mg/dL — ABNORMAL HIGH (ref <200)
HDL: 53 mg/dL (ref >50)
LDL Cholesterol: 159 mg/dL — ABNORMAL HIGH (ref <100)
TRIGLYCERIDES: 147 mg/dL (ref <150)
VLDL: 29 mg/dL (ref <30)

## 2017-03-27 LAB — TSH: TSH: 1.4 mIU/L

## 2017-03-27 NOTE — Assessment & Plan Note (Signed)
Refer to podiatrist 

## 2017-03-27 NOTE — Assessment & Plan Note (Signed)
Pap smear collected today 

## 2017-03-27 NOTE — Progress Notes (Signed)
Patient ID: Stephanie Koch, female   DOB: [birth year], 51 y.o.   MRN: [id number]   Subjective:   Stephanie Koch is a 51 y.o. female here for a complete physical exam  Interim issues since last visit: got flu shot this past season; she did not get the flu  USPSTF grade A and B recommendations Depression:  Depression screen [facility] 2/9 03/27/2017 11/07/2016 08/24/2016 03/23/2016  Decreased Interest 0 0 0 0  Down, Depressed, Hopeless 0 0 0 0  PHQ - 2 Score 0 0 0 0   Hypertension: BP Readings from Last 3 Encounters:  03/27/17 128/76  12/06/16 130/80  11/07/16 104/68   Obesity: Wt Readings from Last 3 Encounters:  03/27/17 169 lb 3 oz (76.7 kg)  12/06/16 166 lb 4.8 oz (75.4 kg)  11/07/16 162 lb (73.5 kg)   BMI Readings from Last 3 Encounters:  03/27/17 29.50 kg/m  12/06/16 28.55 kg/m  11/07/16 27.81 kg/m    Alcohol: no Tobacco use: no HIV, hep B, hep C: declined STD testing and prevention (chl/gon/syphilis): declined Intimate partner violence:no abuse Breast cancer: no lumps BRCA gene screening: mother has fibroids, no cancer in the family Cervical cancer screening: today; one negative since previous abnormal Osteoporosis: n/a Fall prevention/vitamin D: fall prevention; start vit D Lipids: fasting today Lab Results  Component Value Date   CHOL 260 (H) 03/23/2016   CHOL 242 (H) 05/27/2015   CHOL 255 (A) 11/25/2014   Lab Results  Component Value Date   HDL 61 03/23/2016   HDL 57 05/27/2015   HDL 64 11/25/2014   Lab Results  Component Value Date   LDLCALC 164 (H) 03/23/2016   LDLCALC 145 (H) 05/27/2015   LDLCALC 140 11/25/2014   Lab Results  Component Value Date   TRIG 173 (H) 03/23/2016   TRIG 200 (H) 05/27/2015   TRIG 256 (A) 11/25/2014   No results found for: CHOLHDL No results found for: LDLDIRECT  Glucose: fasting today Glucose  Date Value Ref Range Status  03/23/2016 98 65 - 99 mg/dL Final  72/89/3291 93 65 - 99 mg/dL Final   Colorectal cancer:  no fam hx of colorectal cancer; father had noncancerous polyps removed Lung cancer: n/a  AAA: n/a Aspirin: n/a Diet: pretty good water drinker, cereal with almond milk; no regular milk; some fruits and veggies Exercise: try to work way up to 150 minutes a week, but ankles hurt Skin cancer: no worrisome moles  Past Medical History:  Diagnosis Date  . Abnormal Pap smear of cervix 08/2013   ASCUS pap with HR HPV Neg. Needs repeat pap with cotesting 2017.  Marland Kitchen Allergy   . Anxiety   . Barrett's esophagus 07/2010   EGD with biopsy confirmed  . Hyperlipidemia   . 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 09/12/2013   HPV negative; repeat April 2017 NIL, negative HPV   History reviewed. No pertinent surgical history.   Family History  Problem Relation Age of Onset  . Diabetes Mother   . Hypertension Mother   . Hyperlipidemia Mother   . Stroke Father   . Hypertension Father   . Hyperlipidemia Father   . Cancer Paternal Uncle     bowel  . Diabetes Maternal Grandmother   . Heart disease Maternal Grandmother   . Congestive Heart Failure Maternal Grandmother   . Cancer Maternal Grandfather     lung  . Stroke Paternal Grandfather   . Emphysema Paternal Grandmother   . COPD Neg Hx  Social History  Substance Use Topics  . Smoking status: Never Smoker  . Smokeless tobacco: Never Used  . Alcohol use No   Review of Systems  Constitutional: Negative for unexpected weight change.  HENT: Positive for rhinorrhea (runny nose, being around chemicals and smokers). Negative for hearing loss.   Eyes: Negative for visual disturbance (no probs other than glasses).  Respiratory: Positive for cough (from reflux, around smokers, chemicals).   Cardiovascular: Negative for chest pain.  Gastrointestinal: Negative for blood in stool.  Endocrine: Negative for polydipsia and polyuria.  Genitourinary: Negative for hematuria.  Musculoskeletal: Positive for arthralgias (both feet).   Allergic/Immunologic: Negative for food allergies.  Neurological: Negative for tremors.  Hematological: Negative for adenopathy. Does not bruise/bleed easily.   Problems with both feet; along the right Achilles; sore to the touch; hurts with walking; left also hurts; no nodule on the left side though; hard to walk; used to walk all the time, now limited; takes aleve, but doesn't like to take it every day; just if on her feet a lot; tried ice and heat; used the foot baths  Objective:   Vitals:   03/27/17 0815  BP: 128/76  Pulse: 75  Resp: 14  Temp: 97.7 F (36.5 C)  TempSrc: Oral  SpO2: 97%  Weight: 169 lb 3 oz (76.7 kg)  Height: 5' 3.5" (1.613 m)   Body mass index is 29.5 kg/m. Wt Readings from Last 3 Encounters:  03/27/17 169 lb 3 oz (76.7 kg)  12/06/16 166 lb 4.8 oz (75.4 kg)  11/07/16 162 lb (73.5 kg)   Physical Exam  Constitutional: She appears well-developed and well-nourished.  HENT:  Head: Normocephalic and atraumatic.  Eyes: Conjunctivae and EOM are normal. Right eye exhibits no hordeolum. Left eye exhibits no hordeolum. No scleral icterus.  Neck: Carotid bruit is not present. No thyromegaly present.  Cardiovascular: Normal rate, regular rhythm, S1 normal, S2 normal and normal heart sounds.   No extrasystoles are present.  Pulmonary/Chest: Effort normal and breath sounds normal. No respiratory distress. Right breast exhibits no inverted nipple, no mass, no nipple discharge, no skin change and no tenderness. Left breast exhibits no inverted nipple, no mass, no nipple discharge, no skin change and no tenderness. Breasts are symmetrical.  Abdominal: Soft. Normal appearance and bowel sounds are normal. She exhibits no distension, no abdominal bruit, no pulsatile midline mass and no mass. There is no hepatosplenomegaly. There is no tenderness. No hernia.  Genitourinary: Uterus normal. Pelvic exam was performed with patient prone. There is no rash or lesion on the right labia.  There is no rash or lesion on the left labia. Cervix exhibits no motion tenderness. Right adnexum displays no mass, no tenderness and no fullness. Left adnexum displays no mass, no tenderness and no fullness. No erythema or bleeding in the vagina. Vaginal discharge: scant thin discharge.  Musculoskeletal: Normal range of motion. She exhibits no edema.       Right ankle: She exhibits deformity (nodular swelling along distal Achilles tendon). Tenderness.       Left ankle: Tenderness.  Lymphadenopathy:       Head (right side): No submandibular adenopathy present.       Head (left side): No submandibular adenopathy present.    She has no cervical adenopathy.    She has no axillary adenopathy.  Neurological: She is alert. She displays no tremor. No cranial nerve deficit. She exhibits normal muscle tone. Gait normal.  Skin: Skin is warm and dry. No bruising and  no ecchymosis noted. No cyanosis. No pallor.  Psychiatric: Her speech is normal and behavior is normal. Thought content normal. Her mood appears not anxious. She does not exhibit a depressed mood.    Assessment/Plan:   Problem List Items Addressed This Visit      Other   Preventative health care    USPSTF grade A and B recommendations reviewed with patient; age-appropriate recommendations, preventive care, screening tests, etc discussed and encouraged; healthy living encouraged; see AVS for patient education given to patient      Relevant Orders   CBC with Differential/Platelet   COMPLETE METABOLIC PANEL WITH GFR   Lipid panel   TSH   Chronic pain of right heel    Refer to podiatrist      Cervical cancer screening    Pap smear collected today      Relevant Orders   Pap IG and HPV (high risk) DNA detection    Other Visit Diagnoses    Screening for breast cancer    -  Primary   Relevant Orders   MM Digital Screening   Achilles tendon pain       Relevant Orders   Ambulatory referral to Podiatry      No orders of the  defined types were placed in this encounter.  Orders Placed This Encounter  Procedures  . MM Digital Screening    Standing Status:   Future    Standing Expiration Date:   05/27/2018    Order Specific Question:   Reason for Exam (SYMPTOM  OR DIAGNOSIS REQUIRED)    Answer:   yearly screening for breast cancer    Order Specific Question:   Is the patient pregnant?    Answer:   No    Order Specific Question:   Preferred imaging location?    Answer:   [facility]  . CBC with Differential/Platelet  . COMPLETE METABOLIC PANEL WITH GFR  . Lipid panel  . TSH  . Ambulatory referral to Podiatry    Referral Priority:   Routine    Referral Type:   Consultation    Referral Reason:   Specialty Services Required    Requested Specialty:   Podiatry    Number of Visits Requested:   1    Follow up plan: No Follow-up on file.  An After Visit Summary was printed and given to the patient.

## 2017-03-27 NOTE — Assessment & Plan Note (Signed)
USPSTF grade A and B recommendations reviewed with patient; age-appropriate recommendations, preventive care, screening tests, etc discussed and encouraged; healthy living encouraged; see AVS for patient education given to patient  

## 2017-03-27 NOTE — Patient Instructions (Addendum)
Take 1,000 iu of vitamin D3 once a day (not more) You will be due for a screening colonoscopy at age 50 Please do call to schedule your mammogram; the number to schedule one at either [facility] or [facility] is [phone] or [phone]  Health Maintenance, Female Adopting a healthy lifestyle and getting preventive care can go a long way to promote health and wellness. Talk with your health care provider about what schedule of regular examinations is right for you. This is a good chance for you to check in with your provider about disease prevention and staying healthy. In between checkups, there are plenty of things you can do on your own. Experts have done a lot of research about which lifestyle changes and preventive measures are most likely to keep you healthy. Ask your health care provider for more information. Weight and diet Eat a healthy diet  Be sure to include plenty of vegetables, fruits, low-fat dairy products, and lean protein.  Do not eat a lot of foods high in solid fats, added sugars, or salt.  Get regular exercise. This is one of the most important things you can do for your health.  Most adults should exercise for at least 150 minutes each week. The exercise should increase your heart rate and make you sweat (moderate-intensity exercise).  Most adults should also do strengthening exercises at least twice a week. This is in addition to the moderate-intensity exercise. Maintain a healthy weight  Body mass index (BMI) is a measurement that can be used to identify possible weight problems. It estimates body fat based on height and weight. Your health care provider can help determine your BMI and help you achieve or maintain a healthy weight.  For females 36 years of age and older:  A BMI below 18.5 is considered underweight.  A BMI of 18.5 to 24.9 is normal.  A BMI of 25 to 29.9 is considered overweight.  A BMI of 30 and above is  considered obese. Watch levels of cholesterol and blood lipids  You should start having your blood tested for lipids and cholesterol at 50 years of age, then have this test every 5 years.  You may need to have your cholesterol levels checked more often if:  Your lipid or cholesterol levels are high.  You are older than 50 years of age.  You are at high risk for heart disease. Cancer screening Lung Cancer  Lung cancer screening is recommended for adults 74-51 years old who are at high risk for lung cancer because of a history of smoking.  A yearly low-dose CT scan of the lungs is recommended for people who:  Currently smoke.  Have quit within the past 15 years.  Have at least a 30-pack-year history of smoking. A pack year is smoking an average of one pack of cigarettes a day for 1 year.  Yearly screening should continue until it has been 15 years since you quit.  Yearly screening should stop if you develop a health problem that would prevent you from having lung cancer treatment. Breast Cancer  Practice breast self-awareness. This means understanding how your breasts normally appear and feel.  It also means doing regular breast self-exams. Let your health care provider know about any changes, no matter how small.  If you are in your 20s or 30s, you should have a clinical breast exam (CBE) by a health care provider every 1-3 years as part of a regular health exam.  If you are 40 or older, have a CBE every year. Also consider having a breast X-ray (mammogram) every year.  If you have a family history of breast cancer, talk to your health care provider about genetic screening.  If you are at high risk for breast cancer, talk to your health care provider about having an MRI and a mammogram every year.  Breast cancer gene (BRCA) assessment is recommended for women who have family members with BRCA-related cancers. BRCA-related cancers  include:  Breast.  Ovarian.  Tubal.  Peritoneal cancers.  Results of the assessment will determine the need for genetic counseling and BRCA1 and BRCA2 testing. Cervical Cancer  Your health care provider may recommend that you be screened regularly for cancer of the pelvic organs (ovaries, uterus, and vagina). This screening involves a pelvic examination, including checking for microscopic changes to the surface of your cervix (Pap test). You may be encouraged to have this screening done every 3 years, beginning at age 20.  For women ages 9-65, health care providers may recommend pelvic exams and Pap testing every 3 years, or they may recommend the Pap and pelvic exam, combined with testing for human papilloma virus (HPV), every 5 years. Some types of HPV increase your risk of cervical cancer. Testing for HPV may also be done on women of any age with unclear Pap test results.  Other health care providers may not recommend any screening for nonpregnant women who are considered low risk for pelvic cancer and who do not have symptoms. Ask your health care provider if a screening pelvic exam is right for you.  If you have had past treatment for cervical cancer or a condition that could lead to cancer, you need Pap tests and screening for cancer for at least 20 years after your treatment. If Pap tests have been discontinued, your risk factors (such as having a new sexual partner) need to be reassessed to determine if screening should resume. Some women have medical problems that increase the chance of getting cervical cancer. In these cases, your health care provider may recommend more frequent screening and Pap tests. Colorectal Cancer  This type of cancer can be detected and often prevented.  Routine colorectal cancer screening usually begins at 50 years of age and continues through 50 years of age.  Your health care provider may recommend screening at an earlier age if you have risk factors  for colon cancer.  Your health care provider may also recommend using home test kits to check for hidden blood in the stool.  A small camera at the end of a tube can be used to examine your colon directly (sigmoidoscopy or colonoscopy). This is done to check for the earliest forms of colorectal cancer.  Routine screening usually begins at age 88.  Direct examination of the colon should be repeated every 5-10 years through 50 years of age. However, you may need to be screened more often if early forms of precancerous polyps or small growths are found. Skin Cancer  Check your skin from head to toe regularly.  Tell your health care provider about any new moles or changes in moles, especially if there is a change in a mole's shape or color.  Also tell your health care provider if you have a mole that is larger than the size of a pencil eraser.  Always use sunscreen. Apply sunscreen liberally and repeatedly throughout the day.  Protect yourself by wearing long sleeves, pants, a wide-brimmed hat, and sunglasses whenever  you are outside. Heart disease, diabetes, and high blood pressure  High blood pressure causes heart disease and increases the risk of stroke. High blood pressure is more likely to develop in:  People who have blood pressure in the high end of the normal range (130-139/85-89 mm Hg).  People who are overweight or obese.  People who are African American.  If you are 56-57 years of age, have your blood pressure checked every 3-5 years. If you are 70 years of age or older, have your blood pressure checked every year. You should have your blood pressure measured twice-once when you are at a hospital or clinic, and once when you are not at a hospital or clinic. Record the average of the two measurements. To check your blood pressure when you are not at a hospital or clinic, you can use:  An automated blood pressure machine at a pharmacy.  A home blood pressure monitor.  If you  are between 70 years and 27 years old, ask your health care provider if you should take aspirin to prevent strokes.  Have regular diabetes screenings. This involves taking a blood sample to check your fasting blood sugar level.  If you are at a normal weight and have a low risk for diabetes, have this test once every three years after 50 years of age.  If you are overweight and have a high risk for diabetes, consider being tested at a younger age or more often. Preventing infection Hepatitis B  If you have a higher risk for hepatitis B, you should be screened for this virus. You are considered at high risk for hepatitis B if:  You were born in a country where hepatitis B is common. Ask your health care provider which countries are considered high risk.  Your parents were born in a high-risk country, and you have not been immunized against hepatitis B (hepatitis B vaccine).  You have HIV or AIDS.  You use needles to inject street drugs.  You live with someone who has hepatitis B.  You have had sex with someone who has hepatitis B.  You get hemodialysis treatment.  You take certain medicines for conditions, including cancer, organ transplantation, and autoimmune conditions. Hepatitis C  Blood testing is recommended for:  Everyone born from [birth year] through 1964.  Anyone with known risk factors for hepatitis C. Sexually transmitted infections (STIs)  You should be screened for sexually transmitted infections (STIs) including gonorrhea and chlamydia if:  You are sexually active and are younger than 50 years of age.  You are older than 50 years of age and your health care provider tells you that you are at risk for this type of infection.  Your sexual activity has changed since you were last screened and you are at an increased risk for chlamydia or gonorrhea. Ask your health care provider if you are at risk.  If you do not have HIV, but are at risk, it may be recommended that you  take a prescription medicine daily to prevent HIV infection. This is called pre-exposure prophylaxis (PrEP). You are considered at risk if:  You are sexually active and do not regularly use condoms or know the HIV status of your partner(s).  You take drugs by injection.  You are sexually active with a partner who has HIV. Talk with your health care provider about whether you are at high risk of being infected with HIV. If you choose to begin PrEP, you should first be tested for HIV.  You should then be tested every 3 months for as long as you are taking PrEP. Pregnancy  If you are premenopausal and you may become pregnant, ask your health care provider about preconception counseling.  If you may become pregnant, take 400 to 800 micrograms (mcg) of folic acid every day.  If you want to prevent pregnancy, talk to your health care provider about birth control (contraception). Osteoporosis and menopause  Osteoporosis is a disease in which the bones lose minerals and strength with aging. This can result in serious bone fractures. Your risk for osteoporosis can be identified using a bone density scan.  If you are 14 years of age or older, or if you are at risk for osteoporosis and fractures, ask your health care provider if you should be screened.  Ask your health care provider whether you should take a calcium or vitamin D supplement to lower your risk for osteoporosis.  Menopause may have certain physical symptoms and risks.  Hormone replacement therapy may reduce some of these symptoms and risks. Talk to your health care provider about whether hormone replacement therapy is right for you. Follow these instructions at home:  Schedule regular health, dental, and eye exams.  Stay current with your immunizations.  Do not use any tobacco products including cigarettes, chewing tobacco, or electronic cigarettes.  If you are pregnant, do not drink alcohol.  If you are breastfeeding, limit  how much and how often you drink alcohol.  Limit alcohol intake to no more than 1 drink per day for nonpregnant women. One drink equals 12 ounces of beer, 5 ounces of wine, or 1 ounces of hard liquor.  Do not use street drugs.  Do not share needles.  Ask your health care provider for help if you need support or information about quitting drugs.  Tell your health care provider if you often feel depressed.  Tell your health care provider if you have ever been abused or do not feel safe at home. This information is not intended to replace advice given to you by your health care provider. Make sure you discuss any questions you have with your health care provider. Document Released: 06/13/2011 Document Revised: 05/05/2016 Document Reviewed: 09/01/2015 Elsevier Interactive Patient Education  2017 [employer].

## 2017-03-29 LAB — PAP IG AND HPV HIGH-RISK: HPV DNA High Risk: NOT DETECTED

## 2017-04-11 ENCOUNTER — Ambulatory Visit (INDEPENDENT_AMBULATORY_CARE_PROVIDER_SITE_OTHER): Payer: BLUE CROSS/BLUE SHIELD

## 2017-04-11 ENCOUNTER — Ambulatory Visit (INDEPENDENT_AMBULATORY_CARE_PROVIDER_SITE_OTHER): Payer: BLUE CROSS/BLUE SHIELD | Admitting: Podiatry

## 2017-04-11 DIAGNOSIS — M7661 Achilles tendinitis, right leg: Secondary | ICD-10-CM | POA: Diagnosis not present

## 2017-04-11 DIAGNOSIS — M7662 Achilles tendinitis, left leg: Secondary | ICD-10-CM

## 2017-04-11 DIAGNOSIS — M7730 Calcaneal spur, unspecified foot: Secondary | ICD-10-CM | POA: Diagnosis not present

## 2017-04-11 DIAGNOSIS — M7752 Other enthesopathy of left foot: Secondary | ICD-10-CM

## 2017-04-11 DIAGNOSIS — M7751 Other enthesopathy of right foot: Secondary | ICD-10-CM | POA: Diagnosis not present

## 2017-04-11 MED ORDER — MELOXICAM 15 MG PO TABS: 15.0000 mg | ORAL_TABLET | Freq: Every day | ORAL | 3 refills | Status: DC

## 2017-04-11 MED ORDER — METHYLPREDNISOLONE 4 MG PO TABS: ORAL_TABLET | ORAL | 0 refills | Status: DC

## 2017-04-12 NOTE — Progress Notes (Signed)
   Subjective:  Patient presents today for sharp, shooting pain and tenderness to the posterior bilateral ankles, right greater than left. Patient relates significant pain and tenderness when walking. He has been taking Aleve with only minimal relief. Patient presents for further treatment and evaluation.   Objective / Physical Exam:  General:  The patient is alert and oriented x3 in no acute distress. Dermatology:  Skin is warm, dry and supple bilateral lower extremities. Negative for open lesions or macerations. Vascular:  Palpable pedal pulses bilaterally. No edema or erythema noted. Capillary refill within normal limits. Neurological:  Epicritic and protective threshold grossly intact bilaterally.  Musculoskeletal Exam:  Pain on palpation to the insertion of achilles tendon bilaterally. Mild edema noted.  Range of motion within normal limits to all pedal and ankle joints bilateral. Muscle strength 5/5 in all groups bilateral.   Radiographic Exam:  Radiographic evidence of a posterior heel spur noted bilateral.  Assessment: #1 pain in bilateral ankles  #2 synovitis bilateral ankles #3 capsulitis bilateral ankles   Plan of Care:  #1 Patient was evaluated. #2 injection of 0.5 mL Celestone Soluspan injected in the insertion of the achilles tendon bilaterally.  #3 Silicone heel pads x 2 dispensed #4 Prescription for Medrol Dosepak given #5 Prescription for Meloxicam given #6 patient is to return to clinic in 4 weeks   Felecia Shelling, DPM [facility]  Dr. Felecia Shelling, DPM    [address]                                        [place], [place] [address]                Office [phone]  Fax [phone]

## 2017-04-13 MED ORDER — BETAMETHASONE SOD PHOS & ACET 6 (3-3) MG/ML IJ SUSP
3.0000 mg | Freq: Once | INTRAMUSCULAR | Status: DC
Start: 2017-04-13 — End: 2018-11-28

## 2017-05-09 ENCOUNTER — Encounter: Payer: Self-pay | Admitting: Podiatry

## 2017-05-09 ENCOUNTER — Ambulatory Visit (INDEPENDENT_AMBULATORY_CARE_PROVIDER_SITE_OTHER): Payer: BLUE CROSS/BLUE SHIELD | Admitting: Podiatry

## 2017-05-09 DIAGNOSIS — M7752 Other enthesopathy of left foot: Secondary | ICD-10-CM

## 2017-05-09 NOTE — Progress Notes (Signed)
   HPI: Patient is a 51 year old female presenting today for follow up evaluation of continued pain of the left ankle. She states she completed the Medrol Dose Pak and reports improved pain of the right ankle. She states the injection helped alleviate the pain.     Physical Exam: General: The patient is alert and oriented x3 in no acute distress.  Dermatology: Skin is warm, dry and supple bilateral lower extremities. Negative for open lesions or macerations.  Vascular: Palpable pedal pulses bilaterally. No edema or erythema noted. Capillary refill within normal limits.  Neurological: Epicritic and protective threshold grossly intact bilaterally.   Musculoskeletal Exam: Pain on palpation noted to the posterior tubercle of the left calcaneus at the insertion of the Achilles tendon consistent with retrocalcaneal bursitis. Range of motion within normal limits. Muscle strength 5/5 in all muscle groups bilateral lower extremities.   Assessment: 1. Insertional Achilles tendinitis left  Plan of Care:  1. Patient was evaluated. 2. Injection of 0.5 mL Celestone Soluspan injected into the retrocalcaneal bursa. Care was taken to avoid direct injection into the Achilles tendon. 3. Continue taking meloxicam, wearing silicone heel pads and good sneakers. 4. Return to clinic in 4 weeks.    Felecia ShellingBrent M. Devian Bartolomei, DPM [facility]  Dr. Felecia ShellingBrent M. Veronica Guerrant, DPM    [address]                                        [place], [place] [address]                Office [phone]) [phone]  Fax [phone]

## 2017-05-21 ENCOUNTER — Other Ambulatory Visit: Payer: Self-pay | Admitting: Family Medicine

## 2017-05-29 MED ORDER — BETAMETHASONE SOD PHOS & ACET 6 (3-3) MG/ML IJ SUSP: 3.0000 mg | Freq: Once | INTRAMUSCULAR | Status: DC

## 2017-06-06 ENCOUNTER — Ambulatory Visit (INDEPENDENT_AMBULATORY_CARE_PROVIDER_SITE_OTHER): Payer: BLUE CROSS/BLUE SHIELD

## 2017-06-06 ENCOUNTER — Ambulatory Visit (INDEPENDENT_AMBULATORY_CARE_PROVIDER_SITE_OTHER): Payer: BLUE CROSS/BLUE SHIELD | Admitting: Podiatry

## 2017-06-06 DIAGNOSIS — M7662 Achilles tendinitis, left leg: Secondary | ICD-10-CM | POA: Diagnosis not present

## 2017-06-06 DIAGNOSIS — M7752 Other enthesopathy of left foot: Secondary | ICD-10-CM

## 2017-06-06 DIAGNOSIS — M7661 Achilles tendinitis, right leg: Secondary | ICD-10-CM | POA: Diagnosis not present

## 2017-06-07 NOTE — Progress Notes (Signed)
   HPI: Patient is a 51 year old female presenting today for follow up evaluation of continued pain of the left ankle. She reports [place] and states it  made her legs and left foot sore. She reports bruising of the left foot.    Physical Exam: General: The patient is alert and oriented x3 in no acute distress.  Dermatology: Skin is warm, dry and supple bilateral lower extremities. Negative for open lesions or macerations.  Vascular: Palpable pedal pulses bilaterally. No edema or erythema noted. Capillary refill within normal limits.  Neurological: Epicritic and protective threshold grossly intact bilaterally.   Musculoskeletal Exam: Pain on palpation noted to the posterior tubercle of the left calcaneus at the insertion of the Achilles tendon consistent with retrocalcaneal bursitis. Range of motion within normal limits. Muscle strength 5/5 in all muscle groups bilateral lower extremities.  Radiographic Exam:  Normal osseous mineralization. Joint spaces preserved. No fracture/dislocation/boney destruction.    Assessment: 1. Insertional Achilles tendinitis left 2. Retrocalcaneal bursitis left  Plan of Care:  1. Patient was evaluated. X-rays reviewed. 2. Injection of 0.5 mL Celestone Soluspan injected into the retrocalcaneal bursa. Care was taken to avoid direct injection into the Achilles tendon. 3. Ankle brace dispensed. 4. Continue wearing good shoe gear. 5. Return to clinic when necessary.    Felecia ShellingBrent M. Tayari Yankee, DPM [facility]  Dr. Felecia ShellingBrent M. Avery Eustice, DPM    [address]                                        [place], [place] [address]                Office [phone](336) [phone]  Fax [phone]

## 2017-06-08 MED ORDER — BETAMETHASONE SOD PHOS & ACET 6 (3-3) MG/ML IJ SUSP
3.0000 mg | Freq: Once | INTRAMUSCULAR | Status: DC
Start: 2017-06-08 — End: 2018-11-28

## 2017-11-18 ENCOUNTER — Other Ambulatory Visit: Payer: Self-pay | Admitting: Family Medicine

## 2017-12-01 ENCOUNTER — Ambulatory Visit (INDEPENDENT_AMBULATORY_CARE_PROVIDER_SITE_OTHER): Payer: BLUE CROSS/BLUE SHIELD | Admitting: Family Medicine

## 2017-12-01 ENCOUNTER — Encounter: Payer: Self-pay | Admitting: Family Medicine

## 2017-12-01 VITALS — BP 118/76 | HR 78 | Temp 98.5°F | Resp 16 | Wt 169.4 lb

## 2017-12-01 DIAGNOSIS — R197 Diarrhea, unspecified: Secondary | ICD-10-CM | POA: Diagnosis not present

## 2017-12-01 DIAGNOSIS — R109 Unspecified abdominal pain: Secondary | ICD-10-CM

## 2017-12-01 DIAGNOSIS — J069 Acute upper respiratory infection, unspecified: Secondary | ICD-10-CM | POA: Diagnosis not present

## 2017-12-01 MED ORDER — BENZONATATE 100 MG PO CAPS: 100.0000 mg | ORAL_CAPSULE | Freq: Three times a day (TID) | ORAL | 0 refills | Status: DC | PRN

## 2017-12-01 MED ORDER — FLUTICASONE PROPIONATE 50 MCG/ACT NA SUSP: 2.0000 | Freq: Every day | NASAL | 6 refills | Status: DC

## 2017-12-01 MED ORDER — DICYCLOMINE HCL 20 MG PO TABS: 20.0000 mg | ORAL_TABLET | Freq: Three times a day (TID) | ORAL | 0 refills | Status: DC

## 2017-12-01 NOTE — Patient Instructions (Addendum)
Upper Respiratory Infection, Adult Most upper respiratory infections (URIs) are caused by a virus. A URI affects the nose, throat, and upper air passages. The most common type of URI is often called "the common cold." Follow these instructions at home:  Take medicines only as told by your doctor.  Gargle warm saltwater or take cough drops to comfort your throat as told by your doctor.  Use a warm mist humidifier or inhale steam from a shower to increase air moisture. This may make it easier to breathe.  Drink enough fluid to keep your pee (urine) clear or pale yellow.  Eat soups and other clear broths.  Have a healthy diet.  Rest as needed.  Go back to work when your fever is gone or your doctor says it is okay. ? You may need to stay home longer to avoid giving your URI to others. ? You can also wear a face mask and wash your hands often to prevent spread of the virus.  Use your inhaler more if you have asthma.  Do not use any tobacco products, including cigarettes, chewing tobacco, or electronic cigarettes. If you need help quitting, ask your doctor. Contact a doctor if:  You are getting worse, not better.  Your symptoms are not helped by medicine.  You have chills.  You are getting more short of breath.  You have brown or red mucus.  You have yellow or brown discharge from your nose.  You have pain in your face, especially when you bend forward.  You have a fever.  You have puffy (swollen) neck glands.  You have pain while swallowing.  You have white areas in the back of your throat. Get help right away if:  You have very bad or constant: ? Headache. ? Ear pain. ? Pain in your forehead, behind your eyes, and over your cheekbones (sinus pain). ? Chest pain.  You have long-lasting (chronic) lung disease and any of the following: ? Wheezing. ? Long-lasting cough. ? Coughing up blood. ? A change in your usual mucus.  You have a stiff neck.  You have  changes in your: ? Vision. ? Hearing. ? Thinking. ? Mood. This information is not intended to replace advice given to you by your health care provider. Make sure you discuss any questions you have with your health care provider. Document Released: 05/16/2008 Document Revised: 07/31/2016 Document Reviewed: 03/05/2014 Elsevier Interactive Patient Education  2018 [employer].  Food Choices to Help Relieve Diarrhea, Adult When you have diarrhea, the foods you eat and your eating habits are very important. Choosing the right foods and drinks can help:  Relieve diarrhea.  Replace lost fluids and nutrients.  Prevent dehydration.  What general guidelines should I follow? Relieving diarrhea  Choose foods with less than 2 g or .07 oz. of fiber per serving.  Limit fats to less than 8 tsp (38 g or 1.34 oz.) a day.  Avoid the following: ? Foods and beverages sweetened with high-fructose corn syrup, honey, or sugar alcohols such as xylitol, sorbitol, and mannitol. ? Foods that contain a lot of fat or sugar. ? Fried, greasy, or spicy foods. ? High-fiber grains, breads, and cereals. ? Raw fruits and vegetables.  Eat foods that are rich in probiotics. These foods include dairy products such as yogurt and fermented milk products. They help increase healthy bacteria in the stomach and intestines (gastrointestinal tract, or GI tract).  If you have lactose intolerance, avoid dairy products. These may make your diarrhea  worse.  Take medicine to help stop diarrhea (antidiarrheal medicine) only as told by your health care provider. Replacing nutrients  Eat small meals or snacks every 3-4 hours.  Eat bland foods, such as white rice, toast, or baked potato, until your diarrhea starts to get better. Gradually reintroduce nutrient-rich foods as tolerated or as told by your health care provider. This includes: ? Well-cooked protein foods. ? Peeled, seeded, and soft-cooked fruits and  vegetables. ? Low-fat dairy products.  Take vitamin and mineral supplements as told by your health care provider. Preventing dehydration   Start by sipping water or a special solution to prevent dehydration (oral rehydration solution, ORS). Urine that is clear or pale yellow means that you are getting enough fluid.  Try to drink at least 8-10 cups of fluid each day to help replace lost fluids.  You may add other liquids in addition to water, such as clear juice or decaffeinated sports drinks, as tolerated or as told by your health care provider.  Avoid drinks with caffeine, such as coffee, tea, or soft drinks.  Avoid alcohol. What foods are recommended? The items listed may not be a complete list. Talk with your health care provider about what dietary choices are best for you. Grains White rice. White, [place], or pita breads (fresh or toasted), including plain rolls, buns, or bagels. White pasta. Saltine, soda, or graham crackers. Pretzels. Low-fiber cereal. Cooked cereals made with water (such as cornmeal, farina, or cream cereals). Plain muffins. Matzo. Melba toast. Zwieback. Vegetables Potatoes (without the skin). Most well-cooked and canned vegetables without skins or seeds. Tender lettuce. Fruits Apple sauce. Fruits canned in juice. Cooked apricots, cherries, grapefruit, peaches, pears, or plums. Fresh bananas and cantaloupe. Meats and other protein foods Baked or boiled chicken. Eggs. Tofu. Fish. Seafood. Smooth nut butters. Ground or well-cooked tender beef, ham, veal, lamb, pork, or poultry. Dairy Plain yogurt, kefir, and unsweetened liquid yogurt. Lactose-free milk, buttermilk, skim milk, or soy milk. Low-fat or nonfat hard cheese. Beverages Water. Low-calorie sports drinks. Fruit juices without pulp. Strained tomato and vegetable juices. Decaffeinated teas. Sugar-free beverages not sweetened with sugar alcohols. Oral rehydration solutions, if approved by your health care  provider. Seasoning and other foods Bouillon, broth, or soups made from recommended foods. What foods are not recommended? The items listed may not be a complete list. Talk with your health care provider about what dietary choices are best for you. Grains Whole grain, whole wheat, bran, or rye breads, rolls, pastas, and crackers. Wild or brown rice. Whole grain or bran cereals. Barley. Oats and oatmeal. Corn tortillas or taco shells. Granola. Popcorn. Vegetables Raw vegetables. Fried vegetables. Cabbage, broccoli, Brussels sprouts, artichokes, baked beans, beet greens, corn, kale, legumes, peas, sweet potatoes, and yams. Potato skins. Cooked spinach and cabbage. Fruits Dried fruit, including raisins and dates. Raw fruits. Stewed or dried prunes. Canned fruits with syrup. Meat and other protein foods Fried or fatty meats. Deli meats. Chunky nut butters. Nuts and seeds. Beans and lentils. Tomasa BlaseBacon. Hot dogs. Sausage. Dairy High-fat cheeses. Whole milk, chocolate milk, and beverages made with milk, such as milk shakes. Half-and-half. Cream. sour cream. Ice cream. Beverages Caffeinated beverages (such as coffee, tea, soda, or energy drinks). Alcoholic beverages. Fruit juices with pulp. Prune juice. Soft drinks sweetened with high-fructose corn syrup or sugar alcohols. High-calorie sports drinks. Fats and oils Butter. Cream sauces. Margarine. Salad oils. Plain salad dressings. Olives. Avocados. Mayonnaise. Sweets and desserts Sweet rolls, doughnuts, and sweet breads. Sugar-free desserts sweetened with  sugar alcohols such as xylitol and sorbitol. Seasoning and other foods Honey. Hot sauce. Chili powder. Gravy. Cream-based or milk-based soups. Pancakes and waffles. Summary  When you have diarrhea, the foods you eat and your eating habits are very important.  Make sure you get at least 8-10 cups of fluid each day, or enough to keep your urine clear or pale yellow.  Eat bland foods and gradually  reintroduce healthy, nutrient-rich foods as tolerated, or as told by your health care provider.  Avoid high-fiber, fried, greasy, or spicy foods. This information is not intended to replace advice given to you by your health care provider. Make sure you discuss any questions you have with your health care provider. Document Released: 02/18/2004 Document Revised: 11/25/2016 Document Reviewed: 11/25/2016 Elsevier Interactive Patient Education  2018 [employer] Mist Vaporizer A cool mist vaporizer is a device that releases a cool mist into the air. If you have a cough or a cold, using a vaporizer may help relieve your symptoms. The mist adds moisture to the air, which may help thin your mucus and make it less sticky. When your mucus is thin and less sticky, it easier for you to breathe and to cough up secretions. Do not use a vaporizer if you are allergic to mold. Follow these instructions at home:  Follow the instructions that come with the vaporizer.  Do not use anything other than distilled water in the vaporizer.  Do not run the vaporizer all of the time. Doing that can cause mold or bacteria to grow in the vaporizer.  Clean the vaporizer after each time that you use it.  Clean and dry the vaporizer well before storing it.  Stop using the vaporizer if your breathing symptoms get worse. This information is not intended to replace advice given to you by your health care provider. Make sure you discuss any questions you have with your health care provider. Document Released: 08/25/2004 Document Revised: 06/17/2016 Document Reviewed: 02/27/2016 Elsevier Interactive Patient Education  [employer]2018 Elsevier Inc.

## 2017-12-01 NOTE — Progress Notes (Signed)
Name: Stephanie Koch   MRN: [id number]    DOB: 03/31/1966   Date:12/01/2017       Progress Note  Subjective  Chief Complaint  Chief Complaint  Patient presents with  . Sore Throat    Had body aches and chills but not anymore; had a hard time sw[place]. Pt states hurts more on the left side pain a 4   . Diarrhea    Last episode this morning but going on since weneday   . Cough    going since tuesday   . Emesis    last evening   . Ear Pain    both ears     HPI  PT presents with about 4 days of sore throat, bilateral ear pain, dry cough, 4 episodes of vomiting after coughing, watery diarrhea (3 episodes in last 24 hours; improved today). Endorses some body aches, feeling better today though; had fevers early on, went away today; also decreased appetite and intermittent abdominal cramping. 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    Patient Active Problem List   Diagnosis Date Noted  . Preventative health care 03/27/2017  . Chronic pain of right heel 03/23/2016  . Breast cancer screening 03/23/2016  . Cervical cancer screening 03/23/2016  . Screening-pulmonary TB 12/09/2015  . Seasonal allergic rhinitis 05/26/2015  . Mixed hyperlipidemia 05/26/2015  . Barrett esophagus 05/26/2015  . Anxiety state 05/26/2015  . Menopausal state 05/26/2015  . Overweight (BMI 25.0-29.9) 05/26/2015  . 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 09/12/2013    Social History   Tobacco Use  . Smoking status: Never Smoker  . Smokeless tobacco: Never Used  Substance Use Topics  . Alcohol use: No    Current Outpatient Medications:  .  cholecalciferol (VITAMIN D) 1000 units tablet, Take 1,000 Units by mouth daily., Disp: , Rfl:  .  esomeprazole (NEXIUM) 40 MG capsule, Take 40 mg by mouth daily at 12 noon., Disp: , Rfl:  .  loratadine (CLARITIN) 10 MG tablet, Take 10 mg by mouth daily., Disp: , Rfl:  .  Venlafaxine HCl 37.5 MG TB24, TAKE 1 TABLET BY MOUTH ONCE DAILY,  Disp: 90 tablet, Rfl: 1 .  meloxicam (MOBIC) 15 MG tablet, Take 1 tablet (15 mg total) by mouth daily. (Patient not taking: Reported on 12/01/2017), Disp: 30 tablet, Rfl: 3 .  methylPREDNISolone (MEDROL) 4 MG tablet, Take as directed (Patient not taking: Reported on 12/01/2017), Disp: 21 tablet, Rfl: 0 .  Omega-3 Fatty Acids (FISH OIL) 1000 MG CPDR, Take by mouth., Disp: , Rfl:   Current Facility-Administered Medications:  .  betamethasone acetate-betamethasone sodium phosphate (CELESTONE) injection 3 mg, 3 mg, Intramuscular, Once, Evans, Brent M, DPM .  betamethasone acetate-betamethasone sodium phosphate (CELESTONE) injection 3 mg, 3 mg, Intramuscular, Once, Evans, Brent M, DPM .  betamethasone acetate-betamethasone sodium phosphate (CELESTONE) injection 3 mg, 3 mg, Intramuscular, Once, Evans, Brent M, DPM  No Known Allergies  ROS Constitutional: Negative for fever or weight change.  Respiratory: Negative for cough and shortness of breath.   Cardiovascular: Negative for chest pain or palpitations.  Gastrointestinal: Negative for abdominal pain, no bowel changes.  Musculoskeletal: Negative for gait problem or joint swelling.  Skin: Negative for rash.  Neurological: Negative for dizziness or headache.  No other specific complaints in a complete review of systems (except as listed in HPI above).  Objective  Vitals:   12/01/17 1008  BP: 118/76  Pulse: 78  Resp: 16  Temp: 98.5  F (36.9 C)  TempSrc: Oral  SpO2: 98%  Weight: 169 lb 6.4 oz (76.8 kg)   Body mass index is 29.54 kg/m.  Nursing Note and Vital Signs reviewed.  Physical Exam  Constitutional: Patient appears well-developed and well-nourished.  No distress.  HEENT: head atraumatic, normocephalic, pupils equal and reactive to light, EOM's intact, TM's without erythema or bulging, no maxillary or frontal sinus pain on palpation, neck supple with mild LEFT submandibular lymphadenopathy, oropharynx erythematous and moist  without exudate Cardiovascular: Normal rate, regular rhythm, S1/S2 present.  No murmur or rub heard. No BLE edema. Pulmonary/Chest: Effort normal and breath sounds clear. No respiratory distress or retractions. Abdominal: Soft and mild generalized tenderness, bowel sounds present x4 quadrants. Psychiatric: Patient has a normal mood and affect. behavior is normal. Judgment and thought content normal.  No results found for this or any previous visit (from the past 2160 hour(s)).   Assessment & Plan  1. Upper respiratory tract infection, unspecified type - benzonatate (TESSALON PERLES) 100 MG capsule; Take 1 capsule (100 mg total) by mouth 3 (three) times daily as needed.  Dispense: 30 capsule; Refill: 0 - fluticasone (FLONASE) 50 MCG/ACT nasal spray; Place 2 sprays into both nostrils daily.  Dispense: 16 g; Refill: 6 - Tylenol PRN for fevers or pain.  2. Diarrhea, unspecified type - Discussed bland diet to manage diarrhea and continuing to remain hydrated. - Follow up in 3-5 days if not resolving and we will consider running stool studies.  3. Abdominal cramping - dicyclomine (BENTYL) 20 MG tablet; Take 1 tablet (20 mg total) by mouth 4 (four) times daily -  before meals and at bedtime.  Dispense: 20 tablet; Refill: 0  - Work note for today provided - this is last day of work until 12/13/2017.  -Red flags and when to present for emergency care or RTC including fever >101.39F, chest pain, shortness of breath, new/worsening/un-resolving symptoms, severe abdominal pain, vomiting that won't stop, reviewed with patient at time of visit. Follow up and care instructions discussed and provided in AVS.

## 2017-12-13 ENCOUNTER — Encounter: Payer: BLUE CROSS/BLUE SHIELD | Admitting: Family Medicine

## 2018-05-13 ENCOUNTER — Other Ambulatory Visit: Payer: Self-pay | Admitting: Family Medicine

## 2018-10-02 ENCOUNTER — Ambulatory Visit: Payer: BLUE CROSS/BLUE SHIELD

## 2018-10-02 DIAGNOSIS — Z111 Encounter for screening for respiratory tuberculosis: Secondary | ICD-10-CM

## 2018-10-04 LAB — QUANTIFERON-TB GOLD PLUS
Mitogen-NIL: >10 IU/mL
NIL: 0.05 IU/mL
QuantiFERON-TB Gold Plus: NEGATIVE
TB1-NIL: 0.03 IU/mL
TB2-NIL: 0.04 [IU]/mL

## 2018-11-10 ENCOUNTER — Telehealth: Payer: Self-pay | Admitting: Family Medicine

## 2018-11-10 NOTE — Telephone Encounter (Signed)
I have not seen this patient since April of 2018 She had an acute visit in Dec of 2018 She needs an appt I'll refill this time and we look forward to seeing her soon

## 2018-11-12 NOTE — Telephone Encounter (Signed)
Appt made for 12.18.19

## 2018-11-28 ENCOUNTER — Ambulatory Visit (INDEPENDENT_AMBULATORY_CARE_PROVIDER_SITE_OTHER): Payer: BLUE CROSS/BLUE SHIELD | Admitting: Family Medicine

## 2018-11-28 ENCOUNTER — Encounter: Payer: Self-pay | Admitting: Family Medicine

## 2018-11-28 VITALS — BP 112/74 | HR 73 | Temp 98.1°F | Ht 64.0 in | Wt 173.7 lb

## 2018-11-28 DIAGNOSIS — E782 Mixed hyperlipidemia: Secondary | ICD-10-CM

## 2018-11-28 DIAGNOSIS — K2270 Barrett's esophagus without dysplasia: Secondary | ICD-10-CM | POA: Diagnosis not present

## 2018-11-28 DIAGNOSIS — R87610 Atypical squamous cells of undetermined significance on cytologic smear of cervix (ASC-US): Secondary | ICD-10-CM | POA: Diagnosis not present

## 2018-11-28 DIAGNOSIS — J302 Other seasonal allergic rhinitis: Secondary | ICD-10-CM

## 2018-11-28 DIAGNOSIS — Z1211 Encounter for screening for malignant neoplasm of colon: Secondary | ICD-10-CM

## 2018-11-28 DIAGNOSIS — G8929 Other chronic pain: Secondary | ICD-10-CM

## 2018-11-28 DIAGNOSIS — Z0000 Encounter for general adult medical examination without abnormal findings: Secondary | ICD-10-CM

## 2018-11-28 DIAGNOSIS — F411 Generalized anxiety disorder: Secondary | ICD-10-CM | POA: Diagnosis not present

## 2018-11-28 DIAGNOSIS — Z1239 Encounter for other screening for malignant neoplasm of breast: Secondary | ICD-10-CM

## 2018-11-28 DIAGNOSIS — M79671 Pain in right foot: Secondary | ICD-10-CM

## 2018-11-28 LAB — COMPLETE METABOLIC PANEL WITH GFR
AG RATIO: 1.5 (calc) (ref 1.0–2.5)
ALBUMIN MSPROF: 4.3 g/dL (ref 3.6–5.1)
ALKALINE PHOSPHATASE (APISO): 49 U/L (ref 33–130)
ALT: 11 U/L (ref 6–29)
AST: 14 U/L (ref 10–35)
BUN: 14 mg/dL (ref 7–25)
CO2: 29 mmol/L (ref 20–32)
Calcium: 9.4 mg/dL (ref 8.6–10.4)
Chloride: 102 mmol/L (ref 98–110)
Creat: 0.83 mg/dL (ref 0.50–1.05)
GFR, Est African American: 95 mL/min/{1.73_m2} (ref >=60)
GFR, Est Non African American: 82 mL/min/{1.73_m2} (ref >=60)
GLOBULIN: 2.8 g/dL (ref 1.9–3.7)
Glucose, Bld: 105 mg/dL — ABNORMAL HIGH (ref 65–99)
POTASSIUM: 4.1 mmol/L (ref 3.5–5.3)
SODIUM: 140 mmol/L (ref 135–146)
Total Bilirubin: 0.8 mg/dL (ref 0.2–1.2)
Total Protein: 7.1 g/dL (ref 6.1–8.1)

## 2018-11-28 LAB — CBC WITH DIFFERENTIAL/PLATELET
ABSOLUTE MONOCYTES: 568 {cells}/uL (ref 200–950)
BASOS ABS: 33 {cells}/uL (ref 0–200)
Basophils Relative: 0.5 %
EOS ABS: 132 {cells}/uL (ref 15–500)
EOS PCT: 2 %
HEMATOCRIT: 35.3 % (ref 35.0–45.0)
HEMOGLOBIN: 12.2 g/dL (ref 11.7–15.5)
LYMPHS ABS: 2561 {cells}/uL (ref 850–3900)
MCH: 31.8 pg (ref 27.0–33.0)
MCHC: 34.6 g/dL (ref 32.0–36.0)
MCV: 91.9 fL (ref 80.0–100.0)
MPV: 11.5 fL (ref 7.5–12.5)
Monocytes Relative: 8.6 %
Neutro Abs: 3307 cells/uL (ref 1500–7800)
Neutrophils Relative %: 50.1 %
Platelets: 312 10*3/uL (ref 140–400)
RBC: 3.84 10*6/uL (ref 3.80–5.10)
RDW: 12.8 % (ref 11.0–15.0)
Total Lymphocyte: 38.8 %
WBC: 6.6 10*3/uL (ref 3.8–10.8)

## 2018-11-28 LAB — LIPID PANEL
Cholesterol: 249 mg/dL — ABNORMAL HIGH (ref <200)
HDL: 60 mg/dL (ref >50)
LDL CHOLESTEROL (CALC): 161 mg/dL — AB
Non-HDL Cholesterol (Calc): 189 mg/dL (calc) — ABNORMAL HIGH (ref <130)
Total CHOL/HDL Ratio: 4.2 (calc) (ref <5.0)
Triglycerides: 152 mg/dL — ABNORMAL HIGH (ref <150)

## 2018-11-28 LAB — TSH: TSH: 0.99 mIU/L

## 2018-11-28 MED ORDER — VENLAFAXINE HCL ER 37.5 MG PO TB24: 1.0000 | ORAL_TABLET | Freq: Every day | ORAL | 3 refills | Status: DC

## 2018-11-28 MED ORDER — ESOMEPRAZOLE MAGNESIUM 40 MG PO CPDR: 40.0000 mg | DELAYED_RELEASE_CAPSULE | Freq: Every day | ORAL | 0 refills | Status: DC

## 2018-11-28 NOTE — Assessment & Plan Note (Signed)
Get back on flonase; avoid triggers, keeps pets out of the bedroom

## 2018-11-28 NOTE — Assessment & Plan Note (Signed)
Due for physical, return soon

## 2018-11-28 NOTE — Assessment & Plan Note (Signed)
Refill the effexor, one year provided; stable and doing well

## 2018-11-28 NOTE — Progress Notes (Signed)
BP 112/74   Pulse 73   Temp 98.1 F (36.7 C) (Oral)   Ht 5\' 4"  (1.626 m)   Wt 173 lb 11.2 oz (78.8 kg)   LMP 11/14/2015 (Approximate)   SpO2 98%   BMI 29.82 kg/m    Subjective:    Patient ID: Stephanie Koch, female    DOB: 1966/04/12, 52 y.o.   MRN: [id number]  HPI: Stephanie Koch is a 52 y.o. female  Chief Complaint  Patient presents with  . Follow-up  . 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; works as a sub with the school system  We reviewed her medicine list; not taking tessalon perles; no more bentyl, no more problems; uses the flonase just during the spring; that's her bad time of year with allergies; she is struggling with sinus stuff; using OTC stuff, not now; was having congestion, ears hurting, headache; took some alka-seltzer, gel tabs, two weeks or more ago, longer than that actually, 3-4 weeks ago; no fevers  She needs refill of the effexor; she is doing "pretty good"; dog died a year ago; daughter moving to [place], married almost 3 years, husband in [place]  GERD; taking Nexium, getting it OTC; she got something else last week and it didn't help; she has Barrett's esophagus  High cholesterol; tries to limit fatty meats; does eat grilled meats; does eat cheese, [place] and cheese sandwich, no mayo; almond milk Lab Results  Component Value Date   CHOL 249 (H) 11/28/2018   HDL 60 11/28/2018   LDLCALC 161 (H) 11/28/2018   TRIG 152 (H) 11/28/2018   CHOLHDL 4.2 11/28/2018     Depression screen PHQ 2/9 11/28/2018 12/01/2017 03/27/2017 11/07/2016 08/24/2016    Decreased Interest 0 0 0 0 0  Down, Depressed, Hopeless 0 0 0 0 0  PHQ - 2 Score 0 0 0 0 0  Altered sleeping 0 - - - -  Tired, decreased energy 0 - - - -  Change in appetite 0 - - - -  Feeling bad or failure about yourself  0 - - - -  Trouble concentrating 0 - - - -  Moving slowly or fidgety/restless 0 - - - -  Suicidal thoughts 0 - - - -  PHQ-9 Score 0 - - - -  Difficult doing work/chores Not difficult at all - - - -   Fall Risk  11/28/2018 12/01/2017 03/27/2017 11/07/2016 08/24/2016  Falls in the past year? 0 No No No No  Number falls in past yr: 0 - - - -  Injury with Fall? 0 - - - -    Relevant past medical, surgical, family and social history reviewed Past Medical History:  Diagnosis Date  . Abnormal Pap smear of cervix 08/2013   ASCUS pap with HR HPV Neg. Needs repeat pap with cotesting 2017.  Marland Kitchen Allergy   . Anxiety   . Barrett's esophagus 07/2010   EGD with biopsy confirmed  . Hyperlipidemia   . Pap smear  abnormality of cervix with ASCUS favoring benign 09/12/2013   HPV negative; repeat April 2017 NIL, negative HPV   History reviewed. No pertinent surgical history. Family History  Problem Relation Age of Onset  . Diabetes Mother   . Hypertension Mother   . Hyperlipidemia Mother   . Stroke Father   . Hypertension Father   . Hyperlipidemia Father   . Cancer Paternal Uncle        bowel  . Diabetes Maternal Grandmother   . Heart disease Maternal Grandmother   . Congestive Heart Failure Maternal Grandmother   . Cancer Maternal Grandfather        lung  . Stroke Paternal Grandfather   . Emphysema Paternal Grandmother   . COPD Neg Hx    Social History   Tobacco Use  . Smoking status: Never Smoker  . Smokeless tobacco: Never Used  Substance Use Topics  . Alcohol use: No  . Drug use: No     Office Visit from 11/28/2018 in [facility]  AUDIT-C Score  0      Interim medical history since last visit reviewed. Allergies and medications  reviewed  Review of Systems Per HPI unless specifically indicated above     Objective:    BP 112/74   Pulse 73   Temp 98.1 F (36.7 C) (Oral)   Ht 5\' 4"  (1.626 m)   Wt 173 lb 11.2 oz (78.8 kg)   LMP 11/14/2015 (Approximate)   SpO2 98%   BMI 29.82 kg/m   Wt Readings from Last 3 Encounters:  11/28/18 173 lb 11.2 oz (78.8 kg)  12/01/17 169 lb 6.4 oz (76.8 kg)  03/27/17 169 lb 3 oz (76.7 kg)    Physical Exam Constitutional:      General: She is not in acute distress.    Appearance: Normal appearance. She is well-developed. She is not diaphoretic.  HENT:     Head: Normocephalic and atraumatic.  Eyes:     General: No scleral icterus. Neck:     Thyroid: No thyromegaly.  Cardiovascular:     Rate and Rhythm: Normal rate and regular rhythm.     Heart sounds: Normal heart sounds. No murmur.  Pulmonary:     Effort: Pulmonary effort is normal. No respiratory distress.     Breath sounds: Normal breath sounds. No wheezing.  Abdominal:     General: Bowel sounds are normal. There is no distension.     Palpations: Abdomen is soft.     Tenderness: There is no abdominal tenderness.  Skin:    General: Skin is warm and dry.     Coloration: Skin is not pale.  Neurological:     Mental Status: She is alert.  Psychiatric:        Mood and Affect: Mood is not anxious or depressed.        Behavior: Behavior normal.        Thought Content: Thought content normal.        Judgment: Judgment normal.       Assessment & Plan:   Problem List Items Addressed This Visit      Respiratory   Seasonal allergic rhinitis    Get back on flonase; avoid triggers, keeps pets out of the bedroom        Digestive   Barrett esophagus    Urged her to get in with GI for surveillance because of risk of cancer; no blood in stool, no abd pain; one month of PPI and then  GI to do medicine going forward      Relevant Orders   Ambulatory referral to Gastroenterology     Genitourinary   Pap smear  abnormality of cervix with ASCUS favoring benign    Due for physical, return soon        Other   Preventative health care   Relevant Orders   CBC with Differential/Platelet (Completed)   COMPLETE METABOLIC PANEL WITH GFR (Completed)   Lipid panel (Completed)   TSH   Mixed hyperlipidemia    Check lipids today; encouraged limiting saturated fats      Chronic pain of right heel    Managed by podiatry      Relevant Medications   Venlafaxine HCl 37.5 MG TB24   Anxiety state - Primary    Refill the effexor, one year provided; stable and doing well      Relevant Medications   Venlafaxine HCl 37.5 MG TB24    Other Visit Diagnoses    Screening for breast cancer       Relevant Orders   MM 3D SCREEN BREAST BILATERAL   Screen for colon cancer       Relevant Orders   Ambulatory referral to Gastroenterology       Follow up plan: Return in about 4 weeks (around 12/26/2018) for complete physical.  An after-visit summary was printed and given to the patient at check-out.  Please see the patient instructions which may contain other information and recommendations beyond what is mentioned above in the assessment and plan.  Meds ordered this encounter  Medications  . Venlafaxine HCl 37.5 MG TB24    Sig: Take 1 tablet (37.5 mg total) by mouth daily.    Dispense:  90 tablet    Refill:  3  . esomeprazole (NEXIUM) 40 MG capsule    Sig: Take 1 capsule (40 mg total) by mouth daily at 12 noon.    Dispense:  30 capsule    Refill:  0    Orders Placed This Encounter  Procedures  . MM 3D SCREEN BREAST BILATERAL  . CBC with Differential/Platelet  . COMPLETE METABOLIC PANEL WITH GFR  . Lipid panel  . TSH  . Ambulatory referral to Gastroenterology

## 2018-11-28 NOTE — Assessment & Plan Note (Signed)
Managed by podiatry 

## 2018-11-28 NOTE — Assessment & Plan Note (Signed)
Urged her to get in with GI for surveillance because of risk of cancer; no blood in stool, no abd pain; one month of PPI and then GI to do medicine going forward

## 2018-11-28 NOTE — Patient Instructions (Addendum)
Get back on the flonase nasal spray Avoid triggers Keep animals out of the bedroom Try to limit saturated fats in your diet (bologna, hot dogs, barbeque, cheeseburgers, hamburgers, steak, bacon, sausage, cheese, etc.) and get more fresh fruits, vegetables, and whole grains Return for a complete physical in the next 4 weeks

## 2018-11-28 NOTE — Assessment & Plan Note (Signed)
Check lipids today; encouraged limiting saturated fats

## 2018-12-26 ENCOUNTER — Encounter: Payer: Self-pay | Admitting: Family Medicine

## 2018-12-26 ENCOUNTER — Ambulatory Visit (INDEPENDENT_AMBULATORY_CARE_PROVIDER_SITE_OTHER): Payer: BLUE CROSS/BLUE SHIELD | Admitting: Family Medicine

## 2018-12-26 DIAGNOSIS — Z0000 Encounter for general adult medical examination without abnormal findings: Secondary | ICD-10-CM

## 2018-12-26 LAB — COMPLETE METABOLIC PANEL WITH GFR
AG Ratio: 1.5 (calc) (ref 1.0–2.5)
ALKALINE PHOSPHATASE (APISO): 49 U/L (ref 33–130)
ALT: 14 U/L (ref 6–29)
AST: 15 U/L (ref 10–35)
Albumin: 4.4 g/dL (ref 3.6–5.1)
BUN: 15 mg/dL (ref 7–25)
CO2: 31 mmol/L (ref 20–32)
Calcium: 9.6 mg/dL (ref 8.6–10.4)
Chloride: 105 mmol/L (ref 98–110)
Creat: 0.88 mg/dL (ref 0.50–1.05)
GFR, Est African American: 88 mL/min/{1.73_m2} (ref >=60)
GFR, Est Non African American: 76 mL/min/{1.73_m2} (ref >=60)
Globulin: 2.9 g/dL (calc) (ref 1.9–3.7)
Glucose, Bld: 93 mg/dL (ref 65–99)
Potassium: 4.6 mmol/L (ref 3.5–5.3)
Sodium: 142 mmol/L (ref 135–146)
Total Bilirubin: 0.6 mg/dL (ref 0.2–1.2)
Total Protein: 7.3 g/dL (ref 6.1–8.1)

## 2018-12-26 LAB — CBC WITH DIFFERENTIAL/PLATELET
Absolute Monocytes: 555 cells/uL (ref 200–950)
Basophils Absolute: 30 cells/uL (ref 0–200)
Basophils Relative: 0.4 %
Eosinophils Absolute: 152 cells/uL (ref 15–500)
Eosinophils Relative: 2 %
HCT: 37.3 % (ref 35.0–45.0)
Hemoglobin: 12.6 g/dL (ref 11.7–15.5)
Lymphs Abs: 2379 cells/uL (ref 850–3900)
MCH: 31.3 pg (ref 27.0–33.0)
MCHC: 33.8 g/dL (ref 32.0–36.0)
MCV: 92.6 fL (ref 80.0–100.0)
MPV: 11.5 fL (ref 7.5–12.5)
Monocytes Relative: 7.3 %
Neutro Abs: 4484 cells/uL (ref 1500–7800)
Neutrophils Relative %: 59 %
PLATELETS: 297 10*3/uL (ref 140–400)
RBC: 4.03 10*6/uL (ref 3.80–5.10)
RDW: 12.8 % (ref 11.0–15.0)
TOTAL LYMPHOCYTE: 31.3 %
WBC: 7.6 10*3/uL (ref 3.8–10.8)

## 2018-12-26 LAB — LIPID PANEL
Cholesterol: 239 mg/dL — ABNORMAL HIGH (ref <200)
HDL: 53 mg/dL (ref >50)
LDL Cholesterol (Calc): 154 mg/dL (calc) — ABNORMAL HIGH
Non-HDL Cholesterol (Calc): 186 mg/dL (calc) — ABNORMAL HIGH (ref <130)
Total CHOL/HDL Ratio: 4.5 (calc) (ref <5.0)
Triglycerides: 185 mg/dL — ABNORMAL HIGH (ref <150)

## 2018-12-26 LAB — TSH: TSH: 1.69 mIU/L

## 2018-12-26 NOTE — Assessment & Plan Note (Signed)
USPSTF grade A and B recommendations reviewed with patient; age-appropriate recommendations, preventive care, screening tests, etc discussed and encouraged; healthy living encouraged; see AVS for patient education given to patient  

## 2018-12-26 NOTE — Patient Instructions (Addendum)
Keep up the good job with healthier lifestyle and weight loss We'll get labs today Please do call to schedule your mammogram; the number to schedule one at either [facility] or [facility] is [phone] Do see the gastroenterologist soon for your colonoscopy If you have not heard anything from my staff in a week about any orders/referrals/studies from today, please contact us here to follow-up [phone]  Try  lacinato kale without the spine  Health Maintenance, Female Adopting a healthy lifestyle and getting preventive care can go a long way to promote health and wellness. Talk with your health care provider about what schedule of regular examinations is right for you. This is a good chance for you to check in with your provider about disease prevention and staying healthy. In between checkups, there are plenty of things you can do on your own. Experts have done a lot of research about which lifestyle changes and preventive measures are most likely to keep you healthy. Ask your health care provider for more information. Weight and diet Eat a healthy diet  Be sure to include plenty of vegetables, fruits, low-fat dairy products, and lean protein.  Do not eat a lot of foods high in solid fats, added sugars, or salt.  Get regular exercise. This is one of the most important things you can do for your health. ? Most adults should exercise for at least 150 minutes each week. The exercise should increase your heart rate and make you sweat (moderate-intensity exercise). ? Most adults should also do strengthening exercises at least twice a week. This is in addition to the moderate-intensity exercise. Maintain a healthy weight  Body mass index (BMI) is a measurement that can be used to identify possible weight problems. It estimates body fat based on height and weight. Your health care provider can help determine your BMI and help you achieve or maintain a healthy  weight.  For females 7 years of age and older: ? A BMI below 18.5 is considered underweight. ? A BMI of 18.5 to 24.9 is normal. ? A BMI of 25 to 29.9 is considered overweight. ? A BMI of 30 and above is considered obese. Watch levels of cholesterol and blood lipids  You should start having your blood tested for lipids and cholesterol at 52 years of age, then have this test every 5 years.  You may need to have your cholesterol levels checked more often if: ? Your lipid or cholesterol levels are high. ? You are older than 52 years of age. ? You are at high risk for heart disease. Cancer screening Lung Cancer  Lung cancer screening is recommended for adults 45-45 years old who are at high risk for lung cancer because of a history of smoking.  A yearly low-dose CT scan of the lungs is recommended for people who: ? Currently smoke. ? Have quit within the past 15 years. ? Have at least a 30-pack-year history of smoking. A pack year is smoking an average of one pack of cigarettes a day for 1 year.  Yearly screening should continue until it has been 15 years since you quit.  Yearly screening should stop if you develop a health problem that would prevent you from having lung cancer treatment. Breast Cancer  Practice breast self-awareness. This means understanding how your breasts normally appear and feel.  It also means doing regular breast self-exams. Let your health care provider know about any changes, no matter how small.  If  you are in your 20s or 30s, you should have a clinical breast exam (CBE) by a health care provider every 1-3 years as part of a regular health exam.  If you are 34 or older, have a CBE every year. Also consider having a breast X-ray (mammogram) every year.  If you have a family history of breast cancer, talk to your health care provider about genetic screening.  If you are at high risk for breast cancer, talk to your health care provider about having an MRI  and a mammogram every year.  Breast cancer gene (BRCA) assessment is recommended for women who have family members with BRCA-related cancers. BRCA-related cancers include: ? Breast. ? Ovarian. ? Tubal. ? Peritoneal cancers.  Results of the assessment will determine the need for genetic counseling and BRCA1 and BRCA2 testing. Cervical Cancer Your health care provider may recommend that you be screened regularly for cancer of the pelvic organs (ovaries, uterus, and vagina). This screening involves a pelvic examination, including checking for microscopic changes to the surface of your cervix (Pap test). You may be encouraged to have this screening done every 3 years, beginning at age 60.  For women ages 48-65, health care providers may recommend pelvic exams and Pap testing every 3 years, or they may recommend the Pap and pelvic exam, combined with testing for human papilloma virus (HPV), every 5 years. Some types of HPV increase your risk of cervical cancer. Testing for HPV may also be done on women of any age with unclear Pap test results.  Other health care providers may not recommend any screening for nonpregnant women who are considered low risk for pelvic cancer and who do not have symptoms. Ask your health care provider if a screening pelvic exam is right for you.  If you have had past treatment for cervical cancer or a condition that could lead to cancer, you need Pap tests and screening for cancer for at least 20 years after your treatment. If Pap tests have been discontinued, your risk factors (such as having a new sexual partner) need to be reassessed to determine if screening should resume. Some women have medical problems that increase the chance of getting cervical cancer. In these cases, your health care provider may recommend more frequent screening and Pap tests. Colorectal Cancer  This type of cancer can be detected and often prevented.  Routine colorectal cancer screening usually  begins at 52 years of age and continues through 52 years of age.  Your health care provider may recommend screening at an earlier age if you have risk factors for colon cancer.  Your health care provider may also recommend using home test kits to check for hidden blood in the stool.  A small camera at the end of a tube can be used to examine your colon directly (sigmoidoscopy or colonoscopy). This is done to check for the earliest forms of colorectal cancer.  Routine screening usually begins at age 58.  Direct examination of the colon should be repeated every 5-10 years through 52 years of age. However, you may need to be screened more often if early forms of precancerous polyps or small growths are found. Skin Cancer  Check your skin from head to toe regularly.  Tell your health care provider about any new moles or changes in moles, especially if there is a change in a mole's shape or color.  Also tell your health care provider if you have a mole that is larger than the size  of a pencil eraser.  Always use sunscreen. Apply sunscreen liberally and repeatedly throughout the day.  Protect yourself by wearing long sleeves, pants, a wide-brimmed hat, and sunglasses whenever you are outside. Heart disease, diabetes, and high blood pressure  High blood pressure causes heart disease and increases the risk of stroke. High blood pressure is more likely to develop in: ? People who have blood pressure in the high end of the normal range (130-139/85-89 mm Hg). ? People who are overweight or obese. ? People who are African American.  If you are 20-56 years of age, have your blood pressure checked every 3-5 years. If you are 62 years of age or older, have your blood pressure checked every year. You should have your blood pressure measured twice-once when you are at a hospital or clinic, and once when you are not at a hospital or clinic. Record the average of the two measurements. To check your blood  pressure when you are not at a hospital or clinic, you can use: ? An automated blood pressure machine at a pharmacy. ? A home blood pressure monitor.  If you are between 67 years and 37 years old, ask your health care provider if you should take aspirin to prevent strokes.  Have regular diabetes screenings. This involves taking a blood sample to check your fasting blood sugar level. ? If you are at a normal weight and have a low risk for diabetes, have this test once every three years after 52 years of age. ? If you are overweight and have a high risk for diabetes, consider being tested at a younger age or more often. Preventing infection Hepatitis B  If you have a higher risk for hepatitis B, you should be screened for this virus. You are considered at high risk for hepatitis B if: ? You were born in a country where hepatitis B is common. Ask your health care provider which countries are considered high risk. ? Your parents were born in a high-risk country, and you have not been immunized against hepatitis B (hepatitis B vaccine). ? You have HIV or AIDS. ? You use needles to inject street drugs. ? You live with someone who has hepatitis B. ? You have had sex with someone who has hepatitis B. ? You get hemodialysis treatment. ? You take certain medicines for conditions, including cancer, organ transplantation, and autoimmune conditions. Hepatitis C  Blood testing is recommended for: ? Everyone born from [birth year] through 1964. ? Anyone with known risk factors for hepatitis C. Sexually transmitted infections (STIs)  You should be screened for sexually transmitted infections (STIs) including gonorrhea and chlamydia if: ? You are sexually active and are younger than 52 years of age. ? You are older than 52 years of age and your health care provider tells you that you are at risk for this type of infection. ? Your sexual activity has changed since you were last screened and you are at an  increased risk for chlamydia or gonorrhea. Ask your health care provider if you are at risk.  If you do not have HIV, but are at risk, it may be recommended that you take a prescription medicine daily to prevent HIV infection. This is called pre-exposure prophylaxis (PrEP). You are considered at risk if: ? You are sexually active and do not regularly use condoms or know the HIV status of your partner(s). ? You take drugs by injection. ? You are sexually active with a partner who has HIV. Talk  with your health care provider about whether you are at high risk of being infected with HIV. If you choose to begin PrEP, you should first be tested for HIV. You should then be tested every 3 months for as long as you are taking PrEP. Pregnancy  If you are premenopausal and you may become pregnant, ask your health care provider about preconception counseling.  If you may become pregnant, take 400 to 800 micrograms (mcg) of folic acid every day.  If you want to prevent pregnancy, talk to your health care provider about birth control (contraception). Osteoporosis and menopause  Osteoporosis is a disease in which the bones lose minerals and strength with aging. This can result in serious bone fractures. Your risk for osteoporosis can be identified using a bone density scan.  If you are 41 years of age or older, or if you are at risk for osteoporosis and fractures, ask your health care provider if you should be screened.  Ask your health care provider whether you should take a calcium or vitamin D supplement to lower your risk for osteoporosis.  Menopause may have certain physical symptoms and risks.  Hormone replacement therapy may reduce some of these symptoms and risks. Talk to your health care provider about whether hormone replacement therapy is right for you. Follow these instructions at home:  Schedule regular health, dental, and eye exams.  Stay current with your immunizations.  Do not use  any tobacco products including cigarettes, chewing tobacco, or electronic cigarettes.  If you are pregnant, do not drink alcohol.  If you are breastfeeding, limit how much and how often you drink alcohol.  Limit alcohol intake to no more than 1 drink per day for nonpregnant women. One drink equals 12 ounces of beer, 5 ounces of wine, or 1 ounces of hard liquor.  Do not use street drugs.  Do not share needles.  Ask your health care provider for help if you need support or information about quitting drugs.  Tell your health care provider if you often feel depressed.  Tell your health care provider if you have ever been abused or do not feel safe at home. This information is not intended to replace advice given to you by your health care provider. Make sure you discuss any questions you have with your health care provider. Document Released: 06/13/2011 Document Revised: 05/05/2016 Document Reviewed: 09/01/2015 Elsevier Interactive Patient Education  2019 [employer]  Preventing Unhealthy [employer], Adult Staying at a healthy weight is important to your overall health. When fat builds up in your body, you may become overweight or obese. Being overweight or obese increases your risk of developing certain health problems, such as heart disease, diabetes, sleeping problems, joint problems, and some types of cancer. Unhealthy weight gain is often the result of making unhealthy food choices or not getting enough exercise. You can make changes to your lifestyle to prevent obesity and stay as healthy as possible. What nutrition changes can be made?   Eat only as much as your body needs. To do this: ? Pay attention to signs that you are hungry or full. Stop eating as soon as you feel full. ? If you feel hungry, try drinking water first before eating. Drink enough water so your urine is clear or pale yellow. ? Eat smaller portions. Pay attention to portion sizes when eating out. ? Look at  serving sizes on food labels. Most foods contain more than one serving per container. ? Eat the recommended  number of calories for your gender and activity level. For most active people, a daily total of 2,000 calories is appropriate. If you are trying to lose weight or are not very active, you may need to eat fewer calories. Talk with your health care provider or a diet and nutrition specialist (dietitian) about how many calories you need each day.  Choose healthy foods, such as: ? Fruits and vegetables. At each meal, try to fill at least half of your plate with fruits and vegetables. ? Whole grains, such as whole-wheat bread, brown rice, and quinoa. ? Lean meats, such as chicken or fish. ? Other healthy proteins, such as beans, eggs, or tofu. ? Healthy fats, such as nuts, seeds, fatty fish, and olive oil. ? Low-fat or fat-free dairy products.  Check food labels, and avoid food and drinks that: ? Are high in calories. ? Have added sugar. ? Are high in sodium. ? Have saturated fats or trans fats.  Cook foods in healthier ways, such as by baking, broiling, or grilling.  Make a meal plan for the week, and shop with a grocery list to help you stay on track with your purchases. Try to avoid going to the grocery store when you are hungry.  When grocery shopping, try to shop around the outside of the store first, where the fresh foods are. Doing this helps you to avoid prepackaged foods, which can be high in sugar, salt (sodium), and fat. What lifestyle changes can be made?   Exercise for 30 or more minutes on 5 or more days each week. Exercising may include brisk walking, yard work, biking, running, swimming, and team sports like basketball and soccer. Ask your health care provider which exercises are safe for you.  Do muscle-strengthening activities, such as lifting weights or using resistance bands, on 2 or more days a week.  Do not use any products that contain nicotine or tobacco, such as  cigarettes and e-cigarettes. If you need help quitting, ask your health care provider.  Limit alcohol intake to no more than 1 drink a day for nonpregnant women and 2 drinks a day for men. One drink equals 12 oz of beer, 5 oz of wine, or 1 oz of hard liquor.  Try to get 7-9 hours of sleep each night. What other changes can be made?  Keep a food and activity journal to keep track of: ? What you ate and how many calories you had. Remember to count the calories in sauces, dressings, and side dishes. ? Whether you were active, and what exercises you did. ? Your calorie, weight, and activity goals.  Check your weight regularly. Track any changes. If you notice you have gained weight, make changes to your diet or activity routine.  Avoid taking weight-loss medicines or supplements. Talk to your health care provider before starting any new medicine or supplement.  Talk to your health care provider before trying any new diet or exercise plan. Why are these changes important? Eating healthy, staying active, and having healthy habits can help you to prevent obesity. Those changes also:  Help you manage stress and emotions.  Help you connect with friends and family.  Improve your self-esteem.  Improve your sleep.  Prevent long-term health problems. What can happen if changes are not made? Being obese or overweight can cause you to develop joint or bone problems, which can make it hard for you to stay active or do activities you enjoy. Being obese or overweight also puts stress  on your heart and lungs and can lead to health problems like diabetes, heart disease, and some cancers. Where to find more information Talk with your health care provider or a dietitian about healthy eating and healthy lifestyle choices. You may also find information from:  [place] Department of Agriculture, MyPlate: FormerBoss.no  American Heart Association: www.heart.org  Centers for Disease Control and  Prevention: http://www.wolf.info/ Summary  Staying at a healthy weight is important to your overall health. It helps you to prevent certain diseases and health problems, such as heart disease, diabetes, joint problems, sleep disorders, and some types of cancer.  Being obese or overweight can cause you to develop joint or bone problems, which can make it hard for you to stay active or do activities you enjoy.  You can prevent unhealthy weight gain by eating a healthy diet, exercising regularly, not smoking, limiting alcohol, and getting enough sleep.  Talk with your health care provider or a dietitian for guidance about healthy eating and healthy lifestyle choices. This information is not intended to replace advice given to you by your health care provider. Make sure you discuss any questions you have with your health care provider. Document Released: 11/29/2016 Document Revised: 09/08/2017 Document Reviewed: 01/04/2017 Elsevier Interactive Patient Education  2019 [employer].

## 2018-12-26 NOTE — Progress Notes (Signed)
BP 116/70   Pulse 69   Temp 98.3 F (36.8 C)   Ht 5' 4"  (1.626 m)   Wt 169 lb 1.6 oz (76.7 kg)   LMP 11/14/2015 (Approximate)   SpO2 98%   BMI 29.03 kg/m    Subjective:    Patient ID: Stephanie Koch, female    DOB: [birth year], 53 y.o.   MRN: [id number]  HPI: Stephanie Koch is a 53 y.o. female  Chief Complaint  Patient presents with  . Annual Exam    HPI She is here for physical Mentioned that she is flying later this year; never flown; may call later if needed for medicine Working on weight loss; trying to exercise more; more fruits; cutting back on ham and bacon, etc.; cut out sweet tea and doing nothing but water; one cup of coffee in the morning  USPSTF grade A and B recommendations Depression:  Depression screen [facility] 2/9 12/26/2018 11/28/2018 12/01/2017 03/27/2017 11/07/2016  Decreased Interest 0 0 0 0 0  Down, Depressed, Hopeless 0 0 0 0 0  PHQ - 2 Score 0 0 0 0 0  Altered sleeping 0 0 - - -  Tired, decreased energy 0 0 - - -  Change in appetite 0 0 - - -  Feeling bad or failure about yourself  0 0 - - -  Trouble concentrating 0 0 - - -  Moving slowly or fidgety/restless 0 0 - - -  Suicidal thoughts 0 0 - - -  PHQ-9 Score 0 0 - - -  Difficult doing work/chores Not difficult at all Not difficult at all - - -   Hypertension: BP Readings from Last 3 Encounters:  12/26/18 116/70  11/28/18 112/74  12/01/17 118/76   Obesity: Wt Readings from Last 3 Encounters:  12/26/18 169 lb 1.6 oz (76.7 kg)  11/28/18 173 lb 11.2 oz (78.8 kg)  12/01/17 169 lb 6.4 oz (76.8 kg)   BMI Readings from Last 3 Encounters:  12/26/18 29.03 kg/m  11/28/18 29.82 kg/m  12/01/17 29.54 kg/m     Skin cancer: nothing worrisome Lung cancer:  Never smoker Breast cancer: no lumps; order is there and she will schedule it Colorectal cancer: appt coming up for colonoscopy Cervical cancer screening: good until 2021 BRCA gene screening: family hx of breast and/or ovarian cancer and/or  metastatic prostate cancer? no HIV, hep B, hep C: not interested STD testing and prevention (chl/gon/syphilis): not interested Intimate partner violence: no abuse Contraception: LMP 2-3 years ago Osteoporosis: no hx of steroid use, recent menopause Fall prevention/vitamin D: discussed, taking supplement 1000 iu vit D (not more) Immunizations: will get shingrix later; flu shots yearly Diet: better diet Exercise: increasing activity level Alcohol:    [facility] from 12/26/[facility] in [facility]  AUDIT-C Score  0     Tobacco use: never AAA: n/a Aspirin:  The 10-year ASCVD risk score Mikey Bussing DC Jr., et al., 2013) is: 1.3%   Values used to calculate the score:     Age: 61 years     Sex: Female     Is Non-Hispanic African American: No     Diabetic: No     Tobacco smoker: No     Systolic Blood Pressure: 177 mmHg     Is BP treated: No     HDL Cholesterol: 60 mg/dL     Total Cholesterol: 249 mg/dL  Glucose:  Glucose  Date Value Ref Range Status  03/23/2016 98 65 - 99 mg/dL  Final  05/27/2015 93 65 - 99 mg/dL Final   Glucose, Bld  Date Value Ref Range Status  11/28/2018 105 (H) 65 - 99 mg/dL Final    Comment:    .            Fasting reference interval . For someone without known diabetes, a glucose value between 100 and 125 mg/dL is consistent with prediabetes and should be confirmed with a follow-up test. .   03/27/2017 83 65 - 99 mg/dL Final   Lipids:  Lab Results  Component Value Date   CHOL 249 (H) 11/28/2018   CHOL 241 (H) 03/27/2017   CHOL 260 (H) 03/23/2016   Lab Results  Component Value Date   HDL 60 11/28/2018   HDL 53 03/27/2017   HDL 61 03/23/2016   Lab Results  Component Value Date   LDLCALC 161 (H) 11/28/2018   LDLCALC 159 (H) 03/27/2017   LDLCALC 164 (H) 03/23/2016   Lab Results  Component Value Date   TRIG 152 (H) 11/28/2018   TRIG 147 03/27/2017   TRIG 173 (H) 03/23/2016   Lab Results  Component Value Date   CHOLHDL  4.2 11/28/2018   CHOLHDL 4.5 03/27/2017   No results found for: LDLDIRECT   Depression screen [facility] 2/9 12/26/2018 11/28/2018 12/01/2017 03/27/2017 11/07/2016  Decreased Interest 0 0 0 0 0  Down, Depressed, Hopeless 0 0 0 0 0  PHQ - 2 Score 0 0 0 0 0  Altered sleeping 0 0 - - -  Tired, decreased energy 0 0 - - -  Change in appetite 0 0 - - -  Feeling bad or failure about yourself  0 0 - - -  Trouble concentrating 0 0 - - -  Moving slowly or fidgety/restless 0 0 - - -  Suicidal thoughts 0 0 - - -  PHQ-9 Score 0 0 - - -  Difficult doing work/chores Not difficult at all Not difficult at all - - -   Fall Risk  12/26/2018 11/28/2018 12/01/2017 03/27/2017 11/07/2016  Falls in the past year? 0 0 No No No  Number falls in past yr: - 0 - - -  Injury with Fall? - 0 - - -    Relevant past medical, surgical, family and social history reviewed Past Medical History:  Diagnosis Date  . Abnormal Pap smear of cervix 08/2013   ASCUS pap with HR HPV Neg. Needs repeat pap with cotesting 2017.  Marland Kitchen Allergy   . Anxiety   . Barrett's esophagus 07/2010   EGD with biopsy confirmed  . Hyperlipidemia   . 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 09/12/2013   HPV negative; repeat April 2017 NIL, negative HPV   History reviewed. No pertinent surgical history. Family History  Problem Relation Age of Onset  . Diabetes Mother   . Hypertension Mother   . Hyperlipidemia Mother   . Stroke Father   . Hypertension Father   . Hyperlipidemia Father   . Cancer Paternal Uncle        bowel  . Diabetes Maternal Grandmother   . Heart disease Maternal Grandmother   . Congestive Heart Failure Maternal Grandmother   . Cancer Maternal Grandfather        lung  . Stroke Paternal Grandfather   . Emphysema Paternal Grandmother   . COPD Neg Hx    Social History   Tobacco Use  . Smoking status: Never Smoker  . Smokeless tobacco: Never Used  Substance Use  Topics  . Alcohol use: No  . Drug use: No      Office Visit from 12/26/2018 in [facility]  AUDIT-C Score  0      Interim medical history since last visit reviewed. Allergies and medications reviewed  Review of Systems  Constitutional: Negative for unexpected weight change (she is really working on this).  HENT: Negative for nosebleeds.   Eyes: Positive for visual disturbance (glasses).  Respiratory: Negative for wheezing.   Cardiovascular: Negative for chest pain.  Gastrointestinal: Negative for blood in stool.  Endocrine: Negative for polydipsia and polyuria.  Genitourinary: Negative for hematuria.  Skin:       Nothing worrisome  Neurological: Negative for tremors.  Psychiatric/Behavioral: Negative for dysphoric mood.   Per HPI unless specifically indicated above     Objective:    BP 116/70   Pulse 69   Temp 98.3 F (36.8 C)   Ht 5' 4"  (1.626 m)   Wt 169 lb 1.6 oz (76.7 kg)   LMP 11/14/2015 (Approximate)   SpO2 98%   BMI 29.03 kg/m   Wt Readings from Last 3 Encounters:  12/26/18 169 lb 1.6 oz (76.7 kg)  11/28/18 173 lb 11.2 oz (78.8 kg)  12/01/17 169 lb 6.4 oz (76.8 kg)    Physical Exam Constitutional:      Appearance: Normal appearance. She is well-developed.  HENT:     Head: Normocephalic and atraumatic.     Right Ear: Hearing and external ear normal.     Left Ear: Hearing and external ear normal.  Eyes:     General: No scleral icterus.       Right eye: No hordeolum.        Left eye: No hordeolum.     Conjunctiva/sclera: Conjunctivae normal.  Neck:     Thyroid: No thyromegaly.     Vascular: No carotid bruit.  Cardiovascular:     Rate and Rhythm: Normal rate and regular rhythm.  No extrasystoles are present.    Heart sounds: Normal heart sounds, S1 normal and S2 normal.  Pulmonary:     Effort: Pulmonary effort is normal. No respiratory distress.     Breath sounds: Normal breath sounds.  Chest:     Breasts: Breasts are symmetrical.        Right: No inverted nipple, mass,  nipple discharge, skin change or tenderness.        Left: No inverted nipple, mass, nipple discharge, skin change or tenderness.  Abdominal:     General: Bowel sounds are normal. There is no distension or abdominal bruit.     Palpations: Abdomen is soft. There is no mass or pulsatile mass.     Tenderness: There is no abdominal tenderness.     Hernia: No hernia is present.  Musculoskeletal: Normal range of motion.  Lymphadenopathy:     Head:     Right side of head: No submandibular adenopathy.     Left side of head: No submandibular adenopathy.     Cervical: No cervical adenopathy.  Skin:    General: Skin is warm and dry.     Coloration: Skin is not pale.     Findings: No bruising or ecchymosis.  Neurological:     Mental Status: She is alert.     Cranial Nerves: No cranial nerve deficit.     Motor: No tremor or abnormal muscle tone.     Gait: Gait normal.     Deep Tendon Reflexes:     Reflex  Scores:      Patellar reflexes are 2+ on the right side and 2+ on the left side. Psychiatric:        Mood and Affect: Mood is not anxious or depressed.        Speech: Speech normal.        Behavior: Behavior normal.        Thought Content: Thought content normal.     Results for orders placed or performed in visit on 11/28/18  CBC with Differential/Platelet  Result Value Ref Range   WBC 6.6 3.8 - 10.8 Thousand/uL   RBC 3.84 3.80 - 5.10 Million/uL   Hemoglobin 12.2 11.7 - 15.5 g/dL   HCT 35.3 35.0 - 45.0 %   MCV 91.9 80.0 - 100.0 fL   MCH 31.8 27.0 - 33.0 pg   MCHC 34.6 32.0 - 36.0 g/dL   RDW 12.8 11.0 - 15.0 %   Platelets 312 140 - 400 Thousand/uL   MPV 11.5 7.5 - 12.5 fL   Neutro Abs 3,307 1,500 - 7,800 cells/uL   Lymphs Abs 2,561 850 - 3,900 cells/uL   Absolute Monocytes 568 200 - 950 cells/uL   Eosinophils Absolute 132 15 - 500 cells/uL   Basophils Absolute 33 0 - 200 cells/uL   Neutrophils Relative % 50.1 %   Total Lymphocyte 38.8 %   Monocytes Relative 8.6 %   Eosinophils  Relative 2.0 %   Basophils Relative 0.5 %  COMPLETE METABOLIC PANEL WITH GFR  Result Value Ref Range   Glucose, Bld 105 (H) 65 - 99 mg/dL   BUN 14 7 - 25 mg/dL   Creat 0.83 0.50 - 1.05 mg/dL   GFR, Est Non African American 82 > OR = 60 mL/min/1.4m   GFR, Est African American 95 > OR = 60 mL/min/1.743m  BUN/Creatinine Ratio NOT APPLICABLE 6 - 22 (calc)   Sodium 140 135 - 146 mmol/L   Potassium 4.1 3.5 - 5.3 mmol/L   Chloride 102 98 - 110 mmol/L   CO2 29 20 - 32 mmol/L   Calcium 9.4 8.6 - 10.4 mg/dL   Total Protein 7.1 6.1 - 8.1 g/dL   Albumin 4.3 3.6 - 5.1 g/dL   Globulin 2.8 1.9 - 3.7 g/dL (calc)   AG Ratio 1.5 1.0 - 2.5 (calc)   Total Bilirubin 0.8 0.2 - 1.2 mg/dL   Alkaline phosphatase (APISO) 49 33 - 130 U/L   AST 14 10 - 35 U/L   ALT 11 6 - 29 U/L  Lipid panel  Result Value Ref Range   Cholesterol 249 (H) <200 mg/dL   HDL 60 >50 mg/dL   Triglycerides 152 (H) <150 mg/dL   LDL Cholesterol (Calc) 161 (H) mg/dL (calc)   Total CHOL/HDL Ratio 4.2 <5.0 (calc)   Non-HDL Cholesterol (Calc) 189 (H) <130 mg/dL (calc)  TSH  Result Value Ref Range   TSH 0.99 mIU/L      Assessment & Plan:   Problem List Items Addressed This Visit      Other   Preventative health care    USPSTF grade A and B recommendations reviewed with patient; age-appropriate recommendations, preventive care, screening tests, etc discussed and encouraged; healthy living encouraged; see AVS for patient education given to patient       Relevant Orders   CBC with Differential/Platelet   COMPLETE METABOLIC PANEL WITH GFR   Lipid panel   TSH       Follow up plan: Return in about 1 year (around  12/27/2019) for complete physical.  An after-visit summary was printed and given to the patient at [place].  Please see the patient instructions which may contain other information and recommendations beyond what is mentioned above in the assessment and plan.  No orders of the defined types were placed in this  encounter.   Orders Placed This Encounter  Procedures  . CBC with Differential/Platelet  . COMPLETE METABOLIC PANEL WITH GFR  . Lipid panel  . TSH

## 2018-12-27 ENCOUNTER — Encounter: Payer: Self-pay | Admitting: Family Medicine

## 2018-12-27 DIAGNOSIS — N182 Chronic kidney disease, stage 2 (mild): Secondary | ICD-10-CM | POA: Insufficient documentation

## 2019-01-02 ENCOUNTER — Ambulatory Visit: Payer: Self-pay | Admitting: Gastroenterology

## 2019-01-24 ENCOUNTER — Ambulatory Visit: Payer: Self-pay | Admitting: Gastroenterology

## 2019-04-11 ENCOUNTER — Ambulatory Visit
Admission: RE | Admit: 2019-04-11 | Discharge: 2019-04-11 | Disposition: A | Discharge status: Home / Self Care | Payer: BLUE CROSS/BLUE SHIELD | Source: Ambulatory Visit | Attending: Nurse Practitioner | Admitting: Nurse Practitioner

## 2019-04-11 ENCOUNTER — Encounter: Payer: Self-pay | Admitting: Nurse Practitioner

## 2019-04-11 ENCOUNTER — Telehealth: Payer: Self-pay | Admitting: Family Medicine

## 2019-04-11 ENCOUNTER — Ambulatory Visit (INDEPENDENT_AMBULATORY_CARE_PROVIDER_SITE_OTHER): Payer: BLUE CROSS/BLUE SHIELD | Admitting: Nurse Practitioner

## 2019-04-11 ENCOUNTER — Other Ambulatory Visit: Payer: Self-pay | Admitting: Nurse Practitioner

## 2019-04-11 ENCOUNTER — Other Ambulatory Visit: Payer: Self-pay

## 2019-04-11 DIAGNOSIS — M7989 Other specified soft tissue disorders: Secondary | ICD-10-CM | POA: Insufficient documentation

## 2019-04-11 DIAGNOSIS — M79645 Pain in left finger(s): Secondary | ICD-10-CM

## 2019-04-11 MED ORDER — IBUPROFEN 600 MG PO TABS: 600.0000 mg | ORAL_TABLET | Freq: Three times a day (TID) | ORAL | 0 refills | Status: DC | PRN

## 2019-04-11 NOTE — Telephone Encounter (Signed)
Copied from CRM [id number]. Topic: Quick Communication - See Telephone Encounter >> Apr 11, 2019  2:03 PM Jens Som A wrote: CRM for notification. See Telephone encounter for: 04/11/19. Patient is calling to receive the results of her X ray of her Left Thumb. She is also wanting to know does she need to come in for an OV? Please advise [phone]

## 2019-04-11 NOTE — Telephone Encounter (Signed)
Toniann Fail from [employer]. Called trport on pt left thumb. IMPRESSION: No acute osseous injury of the left thumb.

## 2019-04-11 NOTE — Progress Notes (Signed)
Virtual Visit via Video Note  I connected with Stephanie Koch on 04/11/19 at 11:00 AM EDT by a video enabled telemedicine application and verified that I am speaking with the correct person using two identifiers.   Staff discussed the limitations of evaluation and management by telemedicine and the availability of in person appointments. The patient expressed understanding and agreed to proceed.  Patient location: home  My location: home office Other people present:  none HPI  Patient took her [employer] outside on a leash and she tried to chase a rabbit and pulled the leash forward. She tried to grab a pole to steady herself and when the dog pulled it made a cracking noise on her left thumb. She states she broke thumb 30 years ago. Endorses pain and swelling. Pain with bending and touch severe, moderate with no movement.  No paresthesia.   PHQ2/9: [facility] 04/11/2019 12/26/2018 11/28/2018 12/01/2017 03/27/2017  Decreased Interest 0 0 0 0 0  Down, Depressed, Hopeless 0 0 0 0 0  PHQ - 2 Score 0 0 0 0 0  Altered sleeping 0 0 0 - -  Tired, decreased energy 0 0 0 - -  Change in appetite 0 0 0 - -  Feeling bad or failure about yourself  0 0 0 - -  Trouble concentrating 0 0 0 - -  Moving slowly or fidgety/restless 0 0 0 - -  Suicidal thoughts 0 0 0 - -  PHQ-9 Score 0 0 0 - -  Difficult doing work/chores Not difficult at all Not difficult at all Not difficult at all - -    PHQ reviewed. Negative  Patient Active Problem List   Diagnosis Date Noted  . Chronic kidney disease, stage II (mild) 12/27/2018  . Preventative health care 03/27/2017  . Chronic pain of right heel 03/23/2016  . Breast cancer screening 03/23/2016  . Cervical cancer screening 03/23/2016  . Screening-pulmonary TB 12/09/2015  . Seasonal allergic rhinitis 05/26/2015  . Mixed hyperlipidemia 05/26/2015  . Barrett esophagus 05/26/2015  . Anxiety state 05/26/2015  . Menopausal state 05/26/2015  .  Overweight (BMI 25.0-29.9) 05/26/2015  . 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 09/12/2013    Past Medical History:  Diagnosis Date  . Abnormal Pap smear of cervix 08/2013   ASCUS pap with HR HPV Neg. Needs repeat pap with cotesting 2017.  Marland Kitchen. Allergy   . Anxiety   . Barrett's esophagus 07/2010   EGD with biopsy confirmed  . Hyperlipidemia   . 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 09/12/2013   HPV negative; repeat April 2017 NIL, negative HPV    No past surgical history on file.  Social History   Tobacco Use  . Smoking status: Never Smoker  . Smokeless tobacco: Never Used  Substance Use Topics  . Alcohol use: No     Current Outpatient Medications:  .  cholecalciferol (VITAMIN D) 1000 units tablet, Take 1,000 Units by mouth daily., Disp: , Rfl:  .  esomeprazole (NEXIUM) 40 MG capsule, Take 1 capsule (40 mg total) by mouth daily at 12 noon., Disp: 30 capsule, Rfl: 0 .  fluticasone (FLONASE) 50 MCG/ACT nasal spray, Place 2 sprays into both nostrils daily., Disp: 16 g, Rfl: 6 .  loratadine (CLARITIN) 10 MG tablet, Take 10 mg by mouth daily., Disp: , Rfl:  .  Omega-3 Fatty Acids (FISH OIL) 1000 MG CPDR, Take by mouth., Disp: , Rfl:  .  Venlafaxine HCl  37.5 MG TB24, Take 1 tablet (37.5 mg total) by mouth daily., Disp: 90 tablet, Rfl: 3  No Known Allergies  ROS   No other specific complaints in a complete review of systems (except as listed in HPI above).  Objective  There were no vitals filed for this visit.  There is no height or weight on file to calculate BMI.  Nursing Note and Vital Signs reviewed.  Physical Exam   Constitutional: Patient appears well-developed and well-nourished. No distress.  HENT: Head: Normocephalic and atraumatic.  Pulmonary/Chest: Effort normal  Musculoskeletal: left thumb swollen with limited ROM, no bruising Neurological:  alert and oriented Skin: No rash noted. No erythema.  Psychiatric: Patient  has a normal mood and affect. behavior is normal. Judgment and thought content normal.    Assessment & Plan  1. Swelling of thumb, left Ice and elevate, get xray now; treatment based on results.  - DG Finger Thumb Left; Future   Follow Up Instructions:   PRN I discussed the assessment and treatment plan with the patient. The patient was provided an opportunity to ask questions and all were answered. The patient agreed with the plan and demonstrated an understanding of the instructions.   The patient was advised to call back or seek an in-person evaluation if the symptoms worsen or if the condition fails to improve as anticipated.  I provided 12 minutes of non-face-to-face time during this encounter.   Cheryle Horsfall, NP

## 2019-04-12 NOTE — Telephone Encounter (Signed)
See result note. Discussed with patient yesterday.

## 2019-11-13 ENCOUNTER — Encounter: Payer: Self-pay | Admitting: Family Medicine

## 2019-11-13 ENCOUNTER — Other Ambulatory Visit: Payer: Self-pay

## 2019-11-13 ENCOUNTER — Ambulatory Visit (INDEPENDENT_AMBULATORY_CARE_PROVIDER_SITE_OTHER): Payer: 59 | Admitting: Family Medicine

## 2019-11-13 VITALS — BP 132/86 | HR 80 | Temp 98.1°F | Resp 14 | Ht 63.0 in | Wt 168.1 lb

## 2019-11-13 DIAGNOSIS — K219 Gastro-esophageal reflux disease without esophagitis: Secondary | ICD-10-CM

## 2019-11-13 DIAGNOSIS — M25551 Pain in right hip: Secondary | ICD-10-CM | POA: Diagnosis not present

## 2019-11-13 MED ORDER — MELOXICAM 7.5 MG PO TABS: 7.5000 mg | ORAL_TABLET | Freq: Every day | ORAL | 1 refills | Status: AC

## 2019-11-13 MED ORDER — PANTOPRAZOLE SODIUM 40 MG PO TBEC: 40.0000 mg | DELAYED_RELEASE_TABLET | Freq: Every day | ORAL | 2 refills | Status: DC

## 2019-11-13 MED ORDER — MELOXICAM 7.5 MG PO TABS: 7.5000 mg | ORAL_TABLET | Freq: Every day | ORAL | 1 refills | Status: DC

## 2019-11-13 NOTE — Progress Notes (Signed)
Patient ID: Stephanie GibsonLeslie A Kersten, female    DOB: 08-12-1966, 53 y.o.   MRN: [id number]  PCP: Danelle Berryapia, Anquanette Bahner, PA-C  Chief Complaint  Patient presents with  . Leg Pain    hurt stepping off porch, bilateral legs onset 3 weeks    Subjective:   Stephanie Koch is a 53 y.o. female, presents to clinic with CC of the following:  Leg Pain  Incident onset: 3-4 weeks ago. The incident occurred in the yard. Injury mechanism: yanked by dog, pulled her off the porch down a step, her right leg was abducted and extended and she fell onto it, jarring it. The pain is present in the right hip. The pain has been worsening since onset. Associated symptoms include an inability to bear weight and a loss of motion. Pertinent negatives include no loss of sensation, muscle weakness, numbness or tingling. She reports no foreign bodies present. The symptoms are aggravated by movement. She has tried NSAIDs, immobilization, ice, heat and acetaminophen for the symptoms. The treatment provided no relief.  Patient also complains of a gradual onset the past week of left hip pain and low back pain.  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 Over the past month or so she has waited to see if it is improved it has not seemed to improve at all it is very sharp and stabbing in her groin area with internal rotation and external rotation sometimes is painful with weightbearing but not all the time when she gets up and starts walking it does seem to get a little bit better.  She denies any swelling redness or tenderness to the touch around her hip or groin she denies any pelvic pain flank pain or urinary symptoms.   Patient Active Problem List   Diagnosis Date Noted  . Chronic kidney disease,  stage II (mild) 12/27/2018  . Preventative health care 03/27/2017  . Chronic pain of right heel 03/23/2016  . Breast cancer screening 03/23/2016  . Cervical cancer screening 03/23/2016  . Screening-pulmonary TB 12/09/2015  . Seasonal allergic rhinitis 05/26/2015  . Mixed hyperlipidemia 05/26/2015  . Barrett esophagus 05/26/2015  . Anxiety state 05/26/2015  . Menopausal state 05/26/2015  . Overweight (BMI 25.0-29.9) 05/26/2015  . 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 09/12/2013      Current Outpatient Medications:  .  cholecalciferol (VITAMIN D) 1000 units tablet, Take 1,000 Units by mouth daily., Disp: , Rfl:  .  esomeprazole (NEXIUM) 40 MG capsule, Take 1 capsule (40 mg total) by mouth daily at 12 noon., Disp: 30 capsule, Rfl: 0 .  ibuprofen (ADVIL) 600 MG tablet, Take 1 tablet (600 mg total) by mouth every 8 (eight) hours as needed., Disp: 30 tablet, Rfl: 0 .  loratadine (CLARITIN) 10 MG tablet, Take 10 mg by mouth daily., Disp: , Rfl:  .  Venlafaxine HCl 37.5 MG TB24, Take 1 tablet (37.5 mg total) by mouth daily., Disp: 90 tablet, Rfl: 3   No Known Allergies   Family History  Problem Relation Age of Onset  . Diabetes Mother   . Hypertension Mother   . Hyperlipidemia Mother   . Stroke Father   . Hypertension Father   .  Hyperlipidemia Father   . Cancer Paternal Uncle        bowel  . Diabetes Maternal Grandmother   . Heart disease Maternal Grandmother   . Congestive Heart Failure Maternal Grandmother   . Cancer Maternal Grandfather        lung  . Stroke Paternal Grandfather   . Emphysema Paternal Grandmother   . COPD Neg Hx      Social History   Socioeconomic History  . Marital status: Married    Spouse name: Audelia Acton  . Number of children: 1  . Years of education: 59  . Highest education level: Some college, no degree  Occupational History  . Not on file  Social Needs  . Financial resource strain: Not hard at all  . Food insecurity     Worry: Never true    Inability: Never true  . Transportation needs    Medical: No    Non-medical: No  Tobacco Use  . Smoking status: Never Smoker  . Smokeless tobacco: Never Used  Substance and Sexual Activity  . Alcohol use: No  . Drug use: No  . Sexual activity: Not on file  Lifestyle  . Physical activity    Days per week: 2 days    Minutes per session: 30 min  . Stress: Not at all  Relationships  . Social Musician on phone: Three times a week    Gets together: Twice a week    Attends religious service: Never    Active member of club or organization: No    Attends meetings of clubs or organizations: Never    Relationship status: Married  . Intimate partner violence    Fear of current or ex partner: No    Emotionally abused: No    Physically abused: No    Forced sexual activity: No  Other Topics Concern  . Not on file  Social History Narrative  . Not on file    I personally reviewed active problem list, medication list, allergies, family history, social history, health maintenance, notes from last encounter, lab results, imaging with the patient/caregiver today.  Review of Systems  Constitutional: Negative.   HENT: Negative.   Eyes: Negative.   Respiratory: Negative.   Cardiovascular: Negative.   Gastrointestinal: Negative.   Endocrine: Negative.   Genitourinary: Negative.   Musculoskeletal: Negative.   Skin: Negative.   Allergic/Immunologic: Negative.   Neurological: Negative.  Negative for tingling and numbness.  Hematological: Negative.   Psychiatric/Behavioral: Negative.   All other systems reviewed and are negative.     Objective:   Vitals:   11/13/19 1420  Pulse: 80  Resp: 14  Temp: 98.1 F (36.7 C)  SpO2: 95%  Weight: 168 lb 1.6 oz (76.2 kg)  Height:  (1.6 m)    Body mass index is 29.78 kg/m.  Physical Exam Vitals signs and nursing note reviewed.  Constitutional:      Appearance: She is well-developed.  HENT:      Head: Normocephalic and atraumatic.     Nose: Nose normal.  Eyes:     General:        Right eye: No discharge.        Left eye: No discharge.     Conjunctiva/sclera: Conjunctivae normal.  Neck:     Trachea: No tracheal deviation.  Cardiovascular:     Rate and Rhythm: Normal rate and regular rhythm.  Pulmonary:     Effort: Pulmonary effort is normal. No respiratory distress.  Breath sounds: No stridor.  Musculoskeletal:     Right hip: She exhibits decreased range of motion and decreased strength. She exhibits no tenderness, no bony tenderness and no crepitus.     Comments: Right hip pain with active and passive ROM testing with internal rotation and flexion, most severe pain with external rotation - could not do active ROM right hip external rotation but could do passive with pain No crepitus palpated during ROM testing Left hip grossly normal Lumbar spine, SI joints no midline or paraspinal muscle ttp Neg SLR b/l  Skin:    General: Skin is warm and dry.     Findings: No rash.  Neurological:     Mental Status: She is alert.     Gait: Gait abnormal (antalgic).     Comments: Grossly normal sensation to b/l LE   Psychiatric:        Behavior: Behavior normal.         Results for orders placed or performed in visit on 12/26/18  CBC with Differential/Platelet  Result Value Ref Range   WBC 7.6 3.8 - 10.8 Thousand/uL   RBC 4.03 3.80 - 5.10 Million/uL   Hemoglobin 12.6 11.7 - 15.5 g/dL   HCT 37.3 35.0 - 45.0 %   MCV 92.6 80.0 - 100.0 fL   MCH 31.3 27.0 - 33.0 pg   MCHC 33.8 32.0 - 36.0 g/dL   RDW 12.8 11.0 - 15.0 %   Platelets 297 140 - 400 Thousand/uL   MPV 11.5 7.5 - 12.5 fL   Neutro Abs 4,484 1,500 - 7,800 cells/uL   Lymphs Abs 2,379 850 - 3,900 cells/uL   Absolute Monocytes 555 200 - 950 cells/uL   Eosinophils Absolute 152 15 - 500 cells/uL   Basophils Absolute 30 0 - 200 cells/uL   Neutrophils Relative % 59 %   Total Lymphocyte 31.3 %   Monocytes Relative 7.3 %    Eosinophils Relative 2.0 %   Basophils Relative 0.4 %  COMPLETE METABOLIC PANEL WITH GFR  Result Value Ref Range   Glucose, Bld 93 65 - 99 mg/dL   BUN 15 7 - 25 mg/dL   Creat 0.88 0.50 - 1.05 mg/dL   GFR, Est Non African American 76 > OR = 60 mL/min/1.52m2   GFR, Est African American 88 > OR = 60 mL/min/1.61m2   BUN/Creatinine Ratio NOT APPLICABLE 6 - 22 (calc)   Sodium 142 135 - 146 mmol/L   Potassium 4.6 3.5 - 5.3 mmol/L   Chloride 105 98 - 110 mmol/L   CO2 31 20 - 32 mmol/L   Calcium 9.6 8.6 - 10.4 mg/dL   Total Protein 7.3 6.1 - 8.1 g/dL   Albumin 4.4 3.6 - 5.1 g/dL   Globulin 2.9 1.9 - 3.7 g/dL (calc)   AG Ratio 1.5 1.0 - 2.5 (calc)   Total Bilirubin 0.6 0.2 - 1.2 mg/dL   Alkaline phosphatase (APISO) 49 33 - 130 U/L   AST 15 10 - 35 U/L   ALT 14 6 - 29 U/L  Lipid panel  Result Value Ref Range   Cholesterol 239 (H) <200 mg/dL   HDL 53 >50 mg/dL   Triglycerides 185 (H) <150 mg/dL   LDL Cholesterol (Calc) 154 (H) mg/dL (calc)   Total CHOL/HDL Ratio 4.5 <5.0 (calc)   Non-HDL Cholesterol (Calc) 186 (H) <130 mg/dL (calc)  TSH  Result Value Ref Range   TSH 1.69 mIU/L        Assessment & Plan:  1. Acute right hip pain Patient has done conservative measures to help her right hip pain for the past 3 to 4 weeks without any improvement, I do suspect a ligament or labral injury with the mechanism of injury, but jarring to her right leg and with her exam with intermittent sharp pain and difficulty with range of motion testing but she can walk with antalgic gait initially when she gets little bit better the more she is walking I do not suspect fracture but I have offered her a x-ray while trying to get her into Ortho Will do Mobic and protect her stomach with Protonix, due to what she states is Barrett's esophagus history A work note was given for the next 3 days hopefully allow her to rest, ice, take her medicines and get into the specialist for further evaluation.   -  Ambulatory referral to Orthopedics   2. Gastroesophageal reflux disease without esophagitis protonix daily in the am to protect stomach while treating MSK injury  Orders Placed This Encounter  Procedures  . Ambulatory referral to Orthopedics     Meds ordered this encounter  Medications  . meloxicam (MOBIC) 7.5 MG tablet    Sig: Take 1-2 tablets (7.5-15 mg total) by mouth daily.    Dispense:  30 tablet    Refill:  1  . pantoprazole (PROTONIX) 40 MG tablet    Sig: Take 1 tablet (40 mg total) by mouth daily.    Dispense:  30 tablet    Refill:  2      Danelle Berry, PA-C 11/13/19 2:40 PM

## 2019-11-13 NOTE — Patient Instructions (Signed)

## 2019-11-14 ENCOUNTER — Telehealth: Payer: Self-pay | Admitting: Family Medicine

## 2019-11-14 NOTE — Telephone Encounter (Signed)
Pt called and stated that she has a Ortho appointment on 11/19/19 and would like to know if her out of work note could be extended until then. Please advise

## 2019-12-30 ENCOUNTER — Encounter: Payer: Self-pay | Admitting: Family Medicine

## 2019-12-30 ENCOUNTER — Other Ambulatory Visit: Payer: Self-pay

## 2019-12-30 ENCOUNTER — Other Ambulatory Visit (HOSPITAL_COMMUNITY)
Admission: RE | Admit: 2019-12-30 | Discharge: 2019-12-30 | Disposition: A | Discharge status: Home / Self Care | Payer: PRIVATE HEALTH INSURANCE | Source: Ambulatory Visit | Attending: Family Medicine | Admitting: Family Medicine

## 2019-12-30 ENCOUNTER — Ambulatory Visit (INDEPENDENT_AMBULATORY_CARE_PROVIDER_SITE_OTHER): Payer: 59 | Admitting: Family Medicine

## 2019-12-30 VITALS — BP 122/80 | HR 79 | Temp 97.8°F | Resp 12 | Ht 63.0 in | Wt 169.7 lb

## 2019-12-30 DIAGNOSIS — Z1211 Encounter for screening for malignant neoplasm of colon: Secondary | ICD-10-CM | POA: Diagnosis not present

## 2019-12-30 DIAGNOSIS — Z1231 Encounter for screening mammogram for malignant neoplasm of breast: Secondary | ICD-10-CM

## 2019-12-30 DIAGNOSIS — Z01419 Encounter for gynecological examination (general) (routine) without abnormal findings: Secondary | ICD-10-CM

## 2019-12-30 DIAGNOSIS — Z124 Encounter for screening for malignant neoplasm of cervix: Secondary | ICD-10-CM | POA: Insufficient documentation

## 2019-12-30 DIAGNOSIS — Z130 Encounter for screening for diseases of the blood and blood-forming organs and certain disorders involving the immune mechanism: Secondary | ICD-10-CM

## 2019-12-30 DIAGNOSIS — Z13228 Encounter for screening for other metabolic disorders: Secondary | ICD-10-CM

## 2019-12-30 DIAGNOSIS — E782 Mixed hyperlipidemia: Secondary | ICD-10-CM

## 2019-12-30 DIAGNOSIS — F411 Generalized anxiety disorder: Secondary | ICD-10-CM

## 2019-12-30 DIAGNOSIS — Z1329 Encounter for screening for other suspected endocrine disorder: Secondary | ICD-10-CM

## 2019-12-30 DIAGNOSIS — M25551 Pain in right hip: Secondary | ICD-10-CM

## 2019-12-30 LAB — CBC WITH DIFFERENTIAL/PLATELET
Absolute Monocytes: 542 cells/uL (ref 200–950)
Basophils Absolute: 38 cells/uL (ref 0–200)
Basophils Relative: 0.6 %
Eosinophils Absolute: 113 cells/uL (ref 15–500)
Eosinophils Relative: 1.8 %
HCT: 37.5 % (ref 35.0–45.0)
Hemoglobin: 12.6 g/dL (ref 11.7–15.5)
Lymphs Abs: 2230 cells/uL (ref 850–3900)
MCH: 31.2 pg (ref 27.0–33.0)
MCHC: 33.6 g/dL (ref 32.0–36.0)
MCV: 92.8 fL (ref 80.0–100.0)
MPV: 10.9 fL (ref 7.5–12.5)
Monocytes Relative: 8.6 %
Neutro Abs: 3377 cells/uL (ref 1500–7800)
Neutrophils Relative %: 53.6 %
Platelets: 299 10*3/uL (ref 140–400)
RBC: 4.04 10*6/uL (ref 3.80–5.10)
RDW: 12.7 % (ref 11.0–15.0)
Total Lymphocyte: 35.4 %
WBC: 6.3 10*3/uL (ref 3.8–10.8)

## 2019-12-30 LAB — LIPID PANEL
Cholesterol: 264 mg/dL — ABNORMAL HIGH (ref <200)
HDL: 60 mg/dL (ref >=50)
LDL Cholesterol (Calc): 178 mg/dL (calc) — ABNORMAL HIGH
Non-HDL Cholesterol (Calc): 204 mg/dL (calc) — ABNORMAL HIGH (ref <130)
Total CHOL/HDL Ratio: 4.4 (calc) (ref <5.0)
Triglycerides: 125 mg/dL (ref <150)

## 2019-12-30 LAB — COMPLETE METABOLIC PANEL WITH GFR
AG Ratio: 1.6 (calc) (ref 1.0–2.5)
ALT: 11 U/L (ref 6–29)
AST: 13 U/L (ref 10–35)
Albumin: 4.2 g/dL (ref 3.6–5.1)
Alkaline phosphatase (APISO): 39 U/L (ref 37–153)
BUN: 16 mg/dL (ref 7–25)
CO2: 30 mmol/L (ref 20–32)
Calcium: 8.8 mg/dL (ref 8.6–10.4)
Chloride: 106 mmol/L (ref 98–110)
Creat: 0.85 mg/dL (ref 0.50–1.05)
GFR, Est African American: 91 mL/min/{1.73_m2} (ref >=60)
GFR, Est Non African American: 79 mL/min/{1.73_m2} (ref >=60)
Globulin: 2.6 g/dL (calc) (ref 1.9–3.7)
Glucose, Bld: 90 mg/dL (ref 65–99)
Potassium: 3.9 mmol/L (ref 3.5–5.3)
Sodium: 142 mmol/L (ref 135–146)
Total Bilirubin: 0.7 mg/dL (ref 0.2–1.2)
Total Protein: 6.8 g/dL (ref 6.1–8.1)

## 2019-12-30 MED ORDER — VENLAFAXINE HCL ER 37.5 MG PO TB24: 1.0000 | ORAL_TABLET | Freq: Every day | ORAL | 3 refills | Status: DC

## 2019-12-30 MED ORDER — MELOXICAM 15 MG PO TABS: 15.0000 mg | ORAL_TABLET | Freq: Every day | ORAL | 2 refills | Status: DC

## 2019-12-30 NOTE — Patient Instructions (Addendum)
Calcium, 1200 mg daily and Vit D 3 1000 IU  Health Maintenance  Topic Date Due  . HIV Screening  08/04/1982  . MAMMOGRAM  08/04/1985  . COLONOSCOPY  08/04/2017  . PAP SMEAR-Modifier  03/27/2020  . TETANUS/TDAP  07/12/2020  . INFLUENZA VACCINE  Completed     Preventive Care 34-53 Years Old, Female Preventive care refers to visits with your health care provider and lifestyle choices that can promote health and wellness. This includes:  A yearly physical exam. This may also be called an annual well check.  Regular dental visits and eye exams.  Immunizations.  Screening for certain conditions.  Healthy lifestyle choices, such as eating a healthy diet, getting regular exercise, not using drugs or products that contain nicotine and tobacco, and limiting alcohol use. What can I expect for my preventive care visit? Physical exam Your health care provider will check your:  Height and weight. This may be used to calculate body mass index (BMI), which tells if you are at a healthy weight.  Heart rate and blood pressure.  Skin for abnormal spots. Counseling Your health care provider may ask you questions about your:  Alcohol, tobacco, and drug use.  Emotional well-being.  Home and relationship well-being.  Sexual activity.  Eating habits.  Work and work Statistician.  Method of birth control.  Menstrual cycle.  Pregnancy history. What immunizations do I need?  Influenza (flu) vaccine  This is recommended every year. Tetanus, diphtheria, and pertussis (Tdap) vaccine  You may need a Td booster every 10 years. Varicella (chickenpox) vaccine  You may need this if you have not been vaccinated. Zoster (shingles) vaccine  You may need this after age 47. Measles, mumps, and rubella (MMR) vaccine  You may need at least one dose of MMR if you were born in 1957 or later. You may also need a second dose. Pneumococcal conjugate (PCV13) vaccine  You may need this if you  have certain conditions and were not previously vaccinated. Pneumococcal polysaccharide (PPSV23) vaccine  You may need one or two doses if you smoke cigarettes or if you have certain conditions. Meningococcal conjugate (MenACWY) vaccine  You may need this if you have certain conditions. Hepatitis A vaccine  You may need this if you have certain conditions or if you travel or work in places where you may be exposed to hepatitis A. Hepatitis B vaccine  You may need this if you have certain conditions or if you travel or work in places where you may be exposed to hepatitis B. Haemophilus influenzae type b (Hib) vaccine  You may need this if you have certain conditions. Human papillomavirus (HPV) vaccine  If recommended by your health care provider, you may need three doses over 6 months. You may receive vaccines as individual doses or as more than one vaccine together in one shot (combination vaccines). Talk with your health care provider about the risks and benefits of combination vaccines. What tests do I need? Blood tests  Lipid and cholesterol levels. These may be checked every 5 years, or more frequently if you are over 58 years old.  Hepatitis C test.  Hepatitis B test. Screening  Lung cancer screening. You may have this screening every year starting at age 30 if you have a 30-pack-year history of smoking and currently smoke or have quit within the past 15 years.  Colorectal cancer screening. All adults should have this screening starting at age 16 and continuing until age 48. Your health care provider  may recommend screening at age 87 if you are at increased risk. You will have tests every 1-10 years, depending on your results and the type of screening test.  Diabetes screening. This is done by checking your blood sugar (glucose) after you have not eaten for a while (fasting). You may have this done every 1-3 years.  Mammogram. This may be done every 1-2 years. Talk with your  health care provider about when you should start having regular mammograms. This may depend on whether you have a family history of breast cancer.  BRCA-related cancer screening. This may be done if you have a family history of breast, ovarian, tubal, or peritoneal cancers.  Pelvic exam and Pap test. This may be done every 3 years starting at age 22. Starting at age 56, this may be done every 5 years if you have a Pap test in combination with an HPV test. Other tests  Sexually transmitted disease (STD) testing.  Bone density scan. This is done to screen for osteoporosis. You may have this scan if you are at high risk for osteoporosis. Follow these instructions at home: Eating and drinking  Eat a diet that includes fresh fruits and vegetables, whole grains, lean protein, and low-fat dairy.  Take vitamin and mineral supplements as recommended by your health care provider.  Do not drink alcohol if: ? Your health care provider tells you not to drink. ? You are pregnant, may be pregnant, or are planning to become pregnant.  If you drink alcohol: ? Limit how much you have to 0-1 drink a day. ? Be aware of how much alcohol is in your drink. In the [place] one drink equals one 12 oz bottle of beer (355 mL), one 5 oz glass of wine (148 mL), or one 1 oz glass of hard liquor (44 mL). Lifestyle  Take daily care of your teeth and gums.  Stay active. Exercise for at least 30 minutes on 5 or more days each week.  Do not use any products that contain nicotine or tobacco, such as cigarettes, e-cigarettes, and chewing tobacco. If you need help quitting, ask your health care provider.  If you are sexually active, practice safe sex. Use a condom or other form of birth control (contraception) in order to prevent pregnancy and STIs (sexually transmitted infections).  If told by your health care provider, take low-dose aspirin daily starting at age 46. What's next?  Visit your health care provider once  a year for a well check visit.  Ask your health care provider how often you should have your eyes and teeth checked.  Stay up to date on all vaccines. This information is not intended to replace advice given to you by your health care provider. Make sure you discuss any questions you have with your health care provider. Document Revised: 08/09/2018 Document Reviewed: 08/09/2018 Elsevier Patient Education  2020 [place].    Preventing Osteoporosis, Adult Osteoporosis is a condition that causes the bones to lose density. This means that the bones become thinner, and the normal spaces in bone tissue become larger. Low bone density can make the bones weak and cause them to break more easily. Osteoporosis cannot always be prevented, but you can take steps to lower your risk of developing this condition. How can this condition affect me? If you develop osteoporosis, you will be more likely to break bones in your wrist, spine, or hip. Even a minor accident or injury can be enough to break weak bones. The  bones will also be slower to heal. Osteoporosis can cause other problems as well, such as a stooped posture or trouble with movement. Osteoporosis can occur with aging. As you get older, you may lose bone tissue more quickly, or it may be replaced more slowly. Osteoporosis is more likely to develop if you have poor nutrition or do not get enough calcium or vitamin D. Other lifestyle factors can also play a role. By eating a well-balanced diet and making lifestyle changes, you can help keep your bones strong and healthy, lowering your chances of developing osteoporosis. What can increase my risk? The following factors may make you more likely to develop osteoporosis:  Having a family history of the condition.  Having poor nutrition or not getting enough calcium or vitamin D.  Using certain medicines, such as steroid medicines or antiseizure medicines.  Being any of the following: ? 2 years  of age or older. ? Female. ? A woman who has gone through menopause (is postmenopausal). ? White (Caucasian) or of Asian descent.  Smoking or having a history of smoking.  Not being physically active (being sedentary).  Having a small body frame. What actions can I take to prevent this?  Get enough calcium   Make sure you get enough calcium every day. Calcium is the most important mineral for bone health. Most people can get enough calcium from their diet, but supplements may be recommended for people who are at risk for osteoporosis. Follow these guidelines: ? If you are age 72 or younger, aim to get 1,000 mg of calcium every day. ? If you are older than age 55, aim to get 1,200 mg of calcium every day.  Good sources of calcium include: ? Dairy products, such as low-fat or nonfat milk, cheese, and yogurt. ? Dark green leafy vegetables, such as bok choy and broccoli. ? Foods that have had calcium added to them (calcium-fortified foods), such as orange juice, cereal, bread, soy beverages, and tofu products. ? Nuts, such as almonds.  Check nutrition labels to see how much calcium is in a food or drink. Get enough vitamin D  Try to get enough vitamin D every day. Vitamin D is the most essential vitamin for bone health. It helps the body absorb calcium. Follow these guidelines for how much vitamin D to get from food: ? If you are age 54 or younger, aim to get at least 600 international units (IU) every day. Your health care provider may suggest more. ? If you are older than age 35, aim to get at least 800 international units every day. Your health care provider may suggest more.  Good sources of vitamin D in your diet include: ? Egg yolks. ? Oily fish, such as salmon, sardines, and tuna. ? Milk and cereal fortified with vitamin D.  Your body also makes vitamin D when you are out in the sun. Exposing the bare skin on your face, arms, legs, or back to the sun for no more than 30  minutes a day, 2 times a week is more than enough. Beyond that, make sure you use sunblock to protect your skin from sunburn, which increases your risk for skin cancer. Exercise  Stay active and get exercise every day.  Ask your health care provider what types of exercise are best for you. Weight-bearing and strength-building activities are important for building and maintaining healthy bones. Some examples of these types of activities include: ? Walking and hiking. ? Jogging and running. ? Dancing. ?  Gym exercises. ? Lifting weights. ? Tennis and racquetball. ? Climbing stairs. ? Aerobics. Make other lifestyle changes  Do not use any products that contain nicotine or tobacco, such as cigarettes, e-cigarettes, and chewing tobacco. If you need help quitting, ask your health care provider.  Lose weight if you are overweight.  If you drink alcohol: ? Limit how much you use to:  0-1 drink a day for nonpregnant women.  0-2 drinks a day for men. ? Be aware of how much alcohol is in your drink. In the [place] one drink equals one 12 oz bottle of beer (355 mL), one 5 oz glass of wine (148 mL), or one 1 oz glass of hard liquor (44 mL). Where to find support If you need help making changes to prevent osteoporosis, talk with your health care provider. You can ask for a referral to a diet and nutrition specialist (dietitian) and a physical therapist. Where to find more information Learn more about osteoporosis from:  NIH Osteoporosis and Related [place]: www.bones.SouthExposed.es  [place] Office on [employer]: [place]s.tn  [place]: EquipmentWeekly.com.ee Summary  Osteoporosis is a condition that causes weak bones that are more likely to break.  Eat a healthy diet, making sure you get enough calcium and vitamin D, and stay active by getting regular exercise to help prevent osteoporosis.  Other ways to reduce your risk of osteoporosis include  maintaining a healthy weight and avoiding alcohol and products that contain nicotine or tobacco. This information is not intended to replace advice given to you by your health care provider. Make sure you discuss any questions you have with your health care provider. Document Revised: 06/28/2019 Document Reviewed: 06/28/2019 Elsevier Patient Education  [place].

## 2019-12-30 NOTE — Progress Notes (Signed)
Patient: Stephanie Koch, Female    DOB: [birth year], 54 y.o.   MRN: [id number] Delsa Grana, PA-C Visit Date: 12/30/2019  Today's Provider: Delsa Grana, PA-C   Chief Complaint  Patient presents with  . Annual Exam    with pap   Subjective:   Annual physical exam:  Stephanie Koch isa 54 y.o. female who presents today for complete physical exam:  Exercise/Activity:   Was doing well with lifestyle efforts and trying to loose weight and then pandemic caused her to stay home more, she's eating more  Physical therapy for right hip  Diet/nutrition:  Baking more, eating more at home with covid, no diet efforts currently Sleep: well    F/up on hip - her hip is feeling a little better with PT, but they did think it was something in the joint.  She still has some pain but it has decreased, and she's not limping  USPSTF grade A and B recommendations - reviewed and addressed today  Depression:  Phq 9 completed today by patient, was reviewed by me with patient in the room, score is  negative, pt feels good PHQ 2/9 Scores 12/30/2019 11/13/2019 04/11/2019 12/26/2018  PHQ - 2 Score 0 0 0 0  PHQ- 9 Score 0 0 0 0   Depression screen [facility] 2/9 12/30/2019 11/13/2019 04/11/2019 12/26/2018 11/28/2018  Decreased Interest 0 0 0 0 0  Down, Depressed, Hopeless 0 0 0 0 0  PHQ - 2 Score 0 0 0 0 0  Altered sleeping 0 0 0 0 0  Tired, decreased energy 0 0 0 0 0  Change in appetite 0 0 0 0 0  Feeling bad or failure about yourself  0 0 0 0 0  Trouble concentrating 0 0 0 0 0  Moving slowly or fidgety/restless 0 0 0 0 0  Suicidal thoughts 0 0 0 0 0  PHQ-9 Score 0 0 0 0 0  Difficult doing work/chores Not difficult at all Not difficult at all Not difficult at all Not difficult at all Not difficult at all    Alcohol screening:   Office Visit from 12/30/2019 in [facility]  AUDIT-C Score  0      Immunizations and Health Maintenance: Health Maintenance  Topic Date Due  . HIV Screening   08/04/1982  . MAMMOGRAM  08/04/1985  . COLONOSCOPY  08/04/2017  . PAP SMEAR-Modifier  03/27/2020  . TETANUS/TDAP  07/12/2020  . INFLUENZA VACCINE  Completed     Hep CScreening: neg  STD testing and prevention (HIV/chl/gon/syphilis):  Intimate partner violence:  none  Sexual History/Pain during Intercourse:    No pain or problems Married  Menstrual History/LMP/Abnormal Bleeding:   none Patient's last menstrual period was 11/14/2015 (approximate).  Incontinence Symptoms: none  Breast cancer:  Last Mammogram: Due BRCA gene screening: n/a  Cervical cancer screening: doing today Family hx of cancers - breast, ovarian, uterine, colon:   Pt denies  Osteoporosis:   Discussed high calcium and vitamin D supplementation, weight bearing exercises Pt is supplementing with daily calcium/Vit D. No past - Bone scan/dexa  Skin cancer:  Hx of skin CA -  NO Discussed atypical lesions   Colorectal cancer:   colonoscopy is due- referral put in today    Lung cancer:   Low Dose CT Chest recommended if Age 75-80 years, 30 pack-year currently smoking OR have quit w/in 15years. Patient does not qualify.   Social History   Tobacco Use  . Smoking status:  Never Smoker  . Smokeless tobacco: Never Used  Substance Use Topics  . Alcohol use: No     ECG:n/a  Blood pressure/Hypertension: BP Readings from Last 3 Encounters:  12/30/19 122/80  11/13/19 132/86  12/26/18 116/70    Weight/Obesity: Wt Readings from Last 3 Encounters:  12/30/19 169 lb 11.2 oz (77 kg)  11/13/19 168 lb 1.6 oz (76.2 kg)  12/26/18 169 lb 1.6 oz (76.7 kg)   BMI Readings from Last 3 Encounters:  12/30/19 30.06 kg/m  11/13/19 29.78 kg/m  12/26/18 29.03 kg/m     Lipids:  Lab Results  Component Value Date   CHOL 264 (H) 12/30/2019   CHOL 239 (H) 12/26/2018   CHOL 249 (H) 11/28/2018   Lab Results  Component Value Date   HDL 60 12/30/2019   HDL 53 12/26/2018   HDL 60 11/28/2018   Lab Results    Component Value Date   LDLCALC 178 (H) 12/30/2019   LDLCALC 154 (H) 12/26/2018   LDLCALC 161 (H) 11/28/2018   Lab Results  Component Value Date   TRIG 125 12/30/2019   TRIG 185 (H) 12/26/2018   TRIG 152 (H) 11/28/2018   Lab Results  Component Value Date   CHOLHDL 4.4 12/30/2019   CHOLHDL 4.5 12/26/2018   CHOLHDL 4.2 11/28/2018   No results found for: LDLDIRECT Based on the results of lipid panel his/her cardiovascular risk factor ( using [place] )  in the next 10 years is: The 10-year ASCVD risk score Mikey Bussing DC Brooke Bonito., et al., 2013) is: 1.7%   Values used to calculate the score:     Age: 45 years     Sex: Female     Is Non-Hispanic African American: No     Diabetic: No     Tobacco smoker: No     Systolic Blood Pressure: 335 mmHg     Is BP treated: No     HDL Cholesterol: 60 mg/dL     Total Cholesterol: 264 mg/dL  Glucose:  Glucose, Bld  Date Value Ref Range Status  12/30/2019 90 65 - 99 mg/dL Final    Comment:    .            Fasting reference interval .   12/26/2018 93 65 - 99 mg/dL Final    Comment:    .            Fasting reference interval .   11/28/2018 105 (H) 65 - 99 mg/dL Final    Comment:    .            Fasting reference interval . For someone without known diabetes, a glucose value between 100 and 125 mg/dL is consistent with prediabetes and should be confirmed with a follow-up test. .       [facility] Visit from 12/30/2019 in [facility]  AUDIT-C Score  0     Depression: Phq 9 is  negative Depression screen [facility] 2/9 12/30/2019 11/13/2019 04/11/2019 12/26/2018 11/28/2018  Decreased Interest 0 0 0 0 0  Down, Depressed, Hopeless 0 0 0 0 0  PHQ - 2 Score 0 0 0 0 0  Altered sleeping 0 0 0 0 0  Tired, decreased energy 0 0 0 0 0  Change in appetite 0 0 0 0 0  Feeling bad or failure about yourself  0 0 0 0 0  Trouble concentrating 0 0 0 0 0  Moving slowly or fidgety/restless 0 0 0 0 0  Suicidal  thoughts 0 0 0 0 0  PHQ-9 Score  0 0 0 0 0  Difficult doing work/chores Not difficult at all Not difficult at all Not difficult at all Not difficult at all Not difficult at all   Hypertension: BP Readings from Last 3 Encounters:  12/30/19 122/80  11/13/19 132/86  12/26/18 116/70   Obesity: Wt Readings from Last 3 Encounters:  12/30/19 169 lb 11.2 oz (77 kg)  11/13/19 168 lb 1.6 oz (76.2 kg)  12/26/18 169 lb 1.6 oz (76.7 kg)   BMI Readings from Last 3 Encounters:  12/30/19 30.06 kg/m  11/13/19 29.78 kg/m  12/26/18 29.03 kg/m      Advanced Care Planning:  A voluntary discussion about advance care planning including the explanation and discussion of advance directives.   Discussed health care proxy and Living will, and the patient was able to identify a health care proxy as Adalei Novell   Patient does not have a living will at present time.   Social History      She  reports that she has never smoked. She has never used smokeless tobacco. She reports that she does not drink alcohol or use drugs.       Social History   Socioeconomic History  . Marital status: Married    Spouse name: Fulton Reek  . Number of children: 1  . Years of education: 85  . Highest education level: Some college, no degree  Occupational History  . Not on file  Tobacco Use  . Smoking status: Never Smoker  . Smokeless tobacco: Never Used  Substance and Sexual Activity  . Alcohol use: No  . Drug use: No  . Sexual activity: Not on file  Other Topics Concern  . Not on file  Social History Narrative  . Not on file   Social Determinants of Health   Financial Resource Strain:   . Difficulty of Paying Living Expenses: Not on file  Food Insecurity:   . Worried About Charity fundraiser in the Last Year: Not on file  . Ran Out of Food in the Last Year: Not on file  Transportation Needs:   . Lack of Transportation (Medical): Not on file  . Lack of Transportation (Non-Medical): Not on file  Physical Activity:   . Days of Exercise per  Week: Not on file  . Minutes of Exercise per Session: Not on file  Stress:   . Feeling of Stress : Not on file  Social Connections:   . Frequency of Communication with Friends and Family: Not on file  . Frequency of Social Gatherings with Friends and Family: Not on file  . Attends Religious Services: Not on file  . Active Member of Clubs or Organizations: Not on file  . Attends Archivist Meetings: Not on file  . Marital Status: Not on file    Family History        Family Status  Relation Name Status  . Mother  Alive  . Father  Alive  . Annamarie Major  (Not Specified)  . MGM  (Not Specified)  . MGF  (Not Specified)  . PGF  (Not Specified)  . PGM  (Not Specified)  . Neg Hx  (Not Specified)        Her family history includes Cancer in her maternal grandfather and paternal uncle; Congestive Heart Failure in her maternal grandmother; Diabetes in her maternal grandmother and mother; Emphysema in her paternal grandmother; Heart disease in her maternal grandmother; Hyperlipidemia in  her father and mother; Hypertension in her father and mother; Stroke in her father and paternal grandfather. There is no history of COPD.       Family History  Problem Relation Age of Onset  . Diabetes Mother   . Hypertension Mother   . Hyperlipidemia Mother   . Stroke Father   . Hypertension Father   . Hyperlipidemia Father   . Cancer Paternal Uncle        bowel  . Diabetes Maternal Grandmother   . Heart disease Maternal Grandmother   . Congestive Heart Failure Maternal Grandmother   . Cancer Maternal Grandfather        lung  . Stroke Paternal Grandfather   . Emphysema Paternal Grandmother   . COPD Neg Hx     Patient Active Problem List   Diagnosis Date Noted  . Chronic kidney disease, stage II (mild) 12/27/2018  . Preventative health care 03/27/2017  . Chronic pain of right heel 03/23/2016  . Breast cancer screening 03/23/2016  . Cervical cancer screening 03/23/2016  .  Screening-pulmonary TB 12/09/2015  . Seasonal allergic rhinitis 05/26/2015  . Mixed hyperlipidemia 05/26/2015  . Barrett esophagus 05/26/2015  . Anxiety state 05/26/2015  . Menopausal state 05/26/2015  . Overweight (BMI 25.0-29.9) 05/26/2015  . 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 09/12/2013    History reviewed. No pertinent surgical history.   Current Outpatient Medications:  .  cholecalciferol (VITAMIN D) 1000 units tablet, Take 1,000 Units by mouth daily., Disp: , Rfl:  .  loratadine (CLARITIN) 10 MG tablet, Take 10 mg by mouth daily., Disp: , Rfl:  .  pantoprazole (PROTONIX) 40 MG tablet, Take 1 tablet (40 mg total) by mouth daily., Disp: 30 tablet, Rfl: 2 .  Venlafaxine HCl 37.5 MG TB24, Take 1 tablet (37.5 mg total) by mouth daily., Disp: 90 tablet, Rfl: 3 .  meloxicam (MOBIC) 15 MG tablet, Take 1 tablet (15 mg total) by mouth daily., Disp: 30 tablet, Rfl: 2  No Known Allergies  Patient Care Team: Delsa Grana, PA-C as PCP - General (Family Medicine) Lada, Satira Anis, MD as PCP - Family Medicine (Family Medicine) Oh, Lupita Dawn, MD (Inactive) as Consulting Physician (Gastroenterology)  Review of Systems  Constitutional: Negative.  Negative for activity change, appetite change, fatigue and unexpected weight change.  HENT: Negative.   Eyes: Negative.   Respiratory: Negative.  Negative for shortness of breath.   Cardiovascular: Negative.  Negative for chest pain, palpitations and leg swelling.  Gastrointestinal: Negative.  Negative for abdominal pain and blood in stool.  Endocrine: Negative.   Genitourinary: Negative.   Musculoskeletal: Negative.  Negative for arthralgias, gait problem, joint swelling and myalgias.  Skin: Negative.  Negative for color change, pallor and rash.  Allergic/Immunologic: Negative.   Neurological: Negative.  Negative for syncope and weakness.  Hematological: Negative.   Psychiatric/Behavioral: Negative.  Negative for confusion,  dysphoric mood, self-injury and suicidal ideas. The patient is not nervous/anxious.            Objective:   Vitals:  Vitals:   12/30/19 0806  BP: 122/80  Pulse: 79  Resp: 12  Temp: 97.8 F (36.6 C)  SpO2: 98%  Weight: 169 lb 11.2 oz (77 kg)  Height: 5' 3"  (1.6 m)    Body mass index is 30.06 kg/m.  Physical Exam Vitals and nursing note reviewed.  Constitutional:      General: She is not in acute distress.    Appearance: Normal appearance. She is  well-developed. She is not ill-appearing, toxic-appearing or diaphoretic.     Interventions: Face mask in place.  HENT:     Head: Normocephalic and atraumatic.     Right Ear: External ear normal.     Left Ear: External ear normal.  Eyes:     General: Lids are normal. No scleral icterus.       Right eye: No discharge.        Left eye: No discharge.     Conjunctiva/sclera: Conjunctivae normal.  Neck:     Trachea: Phonation normal. No tracheal deviation.  Cardiovascular:     Rate and Rhythm: Normal rate and regular rhythm.     Pulses: Normal pulses.          Radial pulses are 2+ on the right side and 2+ on the left side.       Posterior tibial pulses are 2+ on the right side and 2+ on the left side.     Heart sounds: Normal heart sounds. No murmur. No friction rub. No gallop.   Pulmonary:     Effort: Pulmonary effort is normal. No respiratory distress.     Breath sounds: Normal breath sounds. No stridor. No wheezing, rhonchi or rales.  Chest:     Chest wall: No mass, deformity, swelling or tenderness.     Breasts: Breasts are symmetrical.        Right: Normal. No swelling, bleeding, inverted nipple, mass, nipple discharge, skin change or tenderness.        Left: Inverted nipple present. No swelling, bleeding, mass, nipple discharge, skin change or tenderness.  Abdominal:     General: Bowel sounds are normal. There is no distension.     Palpations: Abdomen is soft.     Tenderness: There is no abdominal tenderness. There is  no guarding or rebound.  Genitourinary:    Vagina: Normal.     Cervix: Normal.     Uterus: Normal.      Adnexa: Right adnexa normal and left adnexa normal.  Musculoskeletal:        General: No deformity. Normal range of motion.     Cervical back: Normal range of motion and neck supple.     Right lower leg: No edema.     Left lower leg: No edema.  Lymphadenopathy:     Cervical: No cervical adenopathy.     Upper Body:     Right upper body: No supraclavicular, axillary or pectoral adenopathy.     Left upper body: No supraclavicular, axillary or pectoral adenopathy.  Skin:    General: Skin is warm and dry.     Capillary Refill: Capillary refill takes less than 2 seconds.     Coloration: Skin is not jaundiced or pale.     Findings: No rash.  Neurological:     Mental Status: She is alert and oriented to person, place, and time.     Motor: No abnormal muscle tone.     Gait: Gait normal.  Psychiatric:        Speech: Speech normal.        Behavior: Behavior normal.       Fall Risk: Fall Risk  12/30/2019 11/13/2019 04/11/2019 12/26/2018 11/28/2018  Falls in the past year? 0 0 0 0 0  Number falls in past yr: 0 0 0 - 0  Injury with Fall? 0 0 0 - 0  Follow up - - Falls evaluation completed - -    Functional Status Survey: Is the patient deaf  or have difficulty hearing?: No Does the patient have difficulty seeing, even when wearing glasses/contacts?: No Does the patient have difficulty concentrating, remembering, or making decisions?: No Does the patient have difficulty walking or climbing stairs?: No Does the patient have difficulty dressing or bathing?: No Does the patient have difficulty doing errands alone such as visiting a doctor's office or shopping?: No   Assessment & Plan:    CPE completed today  . USPSTF grade A and B recommendations reviewed with patient; age-appropriate recommendations, preventive care, screening tests, etc discussed and encouraged; healthy living  encouraged; see AVS for patient education given to patient  . Discussed importance of 150 minutes of physical activity weekly, AHA exercise recommendations given to pt in AVS/handout  . Discussed importance of healthy diet:  eating lean meats and proteins, avoiding trans fats and saturated fats, avoid simple sugars and excessive carbs in diet, eat 6 servings of fruit/vegetables daily and drink plenty of water and avoid sweet beverages.    . Recommended pt to do annual eye exam and routine dental exams/cleanings  . Depression, alcohol, fall screening completed as documented above and per flowsheets  . Reviewed Health Maintenance: Health Maintenance  Topic Date Due  . HIV Screening  08/04/1982  . MAMMOGRAM  08/04/1985  . COLONOSCOPY  08/04/2017  . PAP SMEAR-Modifier  03/27/2020  . TETANUS/TDAP  07/12/2020  . INFLUENZA VACCINE  Completed    . Immunizations: Immunization History  Administered Date(s) Administered  . Influenza,inj,Quad PF,6+ Mos 08/15/2019  . Tdap 07/12/2010     ICD-10-CM   1. Well woman exam with routine gynecological exam  Z01.419 MM 3D SCREEN BREAST BILATERAL    Cytology - PAP    CBC w/ Diff    CMP w GFR    Lipid Panel  2. Encounter for screening mammogram for breast cancer  Z12.31 MM 3D SCREEN BREAST BILATERAL  3. Screening for malignant neoplasm of cervix  Z12.4 Cytology - PAP  4. Screening for malignant neoplasm of colon  Z12.11 Ambulatory referral to Gastroenterology  5. Mixed hyperlipidemia  E78.2 CMP w GFR    Lipid Panel  6. Screening for endocrine, metabolic and immunity disorder  Z13.29 CBC w/ Diff   Z13.228 CMP w GFR   Z13.0 Lipid Panel  7. Acute right hip pain  M25.551 meloxicam (MOBIC) 15 MG tablet   improving a little, refill on mobic, use tylenol, consider ortho referral, discussed GI sx with mobic and to f/up if any epigastric discomfort or GERD  8. Anxiety state  F41.1 Venlafaxine HCl 37.5 MG TB24   mood good, refill on meds          Delsa Grana, PA-C 12/30/19 5:07 PM  [facility]

## 2020-01-01 LAB — CYTOLOGY - PAP
Diagnosis: NEGATIVE
Diagnosis: REACTIVE

## 2020-01-20 ENCOUNTER — Other Ambulatory Visit: Payer: Self-pay

## 2020-01-20 ENCOUNTER — Ambulatory Visit (INDEPENDENT_AMBULATORY_CARE_PROVIDER_SITE_OTHER): Payer: 59 | Admitting: Gastroenterology

## 2020-01-20 ENCOUNTER — Encounter: Payer: Self-pay | Admitting: Gastroenterology

## 2020-01-20 VITALS — BP 160/83 | HR 69 | Temp 98.1°F | Wt 169.1 lb

## 2020-01-20 DIAGNOSIS — K2270 Barrett's esophagus without dysplasia: Secondary | ICD-10-CM | POA: Diagnosis not present

## 2020-01-20 DIAGNOSIS — R05 Cough: Secondary | ICD-10-CM

## 2020-01-20 DIAGNOSIS — K22719 Barrett's esophagus with dysplasia, unspecified: Secondary | ICD-10-CM

## 2020-01-20 DIAGNOSIS — Z1211 Encounter for screening for malignant neoplasm of colon: Secondary | ICD-10-CM

## 2020-01-20 DIAGNOSIS — R053 Chronic cough: Secondary | ICD-10-CM

## 2020-01-20 MED ORDER — NA SULFATE-K SULFATE-MG SULF 17.5-3.13-1.6 GM/177ML PO SOLN: 354.0000 mL | Freq: Once | ORAL | 0 refills | Status: AC

## 2020-01-20 NOTE — Progress Notes (Signed)
Stephanie Mood MD, MRCP(U.K) [address]  [address]  [place], [place] [address]  Main: [phone]  Fax: [phone]   Gastroenterology Consultation  Referring Provider:     Danelle Berry, PA-C Primary Care Physician:  Stephanie Berry, PA-C Primary Gastroenterologist:  Dr. Wyline Koch  Reason for Consultation:    Barrett's esophagus and colon cancer screening        HPI:   Stephanie Koch is a 54 y.o. y/o female referred for consultation & management  by  Stephanie Berry, PA-C.   She has been referred to evaluate for Barrett's esophagus and colon cancer screening.  She says her back in 2011 she was diagnosed with Barrett's esophagus by Dr. Bluford Kaufmann .  She recalls that he did not find any abnormalities in the esophagus but the biopsies showed that she had Barrett's esophagus and was told to return probably in a year for repeat endoscopy.  She is not a smoker.  No first-degree relatives with esophageal cancer.  In fact she actually denies any clear history of acid reflux.  Her main symptoms are chronic cough which she has had for more than 10 or 15 years.  She does give a history of postnasal drip and pain in the frontal sinus region which is worse when bending forward and coughing.  She does suffer from allergies.  She has taken Protonix for more than 15 years.  She takes it first thing in the morning.  It does help her with some relief but does not help with the cough.  When she was in the office I did hear a cough it was a very congested form of coughing which I could guess she had some mucus 2.  She has never had a colonoscopy, not on any blood thinners, no change in bowel movements.  No family history of colon cancer or polyps.  She thinks her father had colon polyps but was told they were completely benign.   Past Medical History:  Diagnosis Date  . Abnormal Pap smear of cervix 08/2013   ASCUS pap with HR HPV Neg. Needs repeat pap with cotesting 2017.  Marland Kitchen Allergy   . Anxiety   . Barrett's  esophagus 07/2010   EGD with biopsy confirmed  . Hyperlipidemia   . 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 09/12/2013   HPV negative; repeat April 2017 NIL, negative HPV    No past surgical history on file.  Prior to Admission medications   Medication Sig Start Date End Date Taking? Authorizing Provider  cholecalciferol (VITAMIN D) 1000 units tablet Take 1,000 Units by mouth daily.    [provider]  loratadine (CLARITIN) 10 MG tablet Take 10 mg by mouth daily.    [provider]  meloxicam (MOBIC) 15 MG tablet Take 1 tablet (15 mg total) by mouth daily. 12/30/19   Stephanie Berry, PA-C  pantoprazole (PROTONIX) 40 MG tablet Take 1 tablet (40 mg total) by mouth daily. 11/13/19   Stephanie Berry, PA-C  Venlafaxine HCl 37.5 MG TB24 Take 1 tablet (37.5 mg total) by mouth daily. 12/30/19   Stephanie Berry, PA-C    Family History  Problem Relation Age of Onset  . Diabetes Mother   . Hypertension Mother   . Hyperlipidemia Mother   . Stroke Father   . Hypertension Father   . Hyperlipidemia Father   . Cancer Paternal Uncle        bowel  . Diabetes Maternal Grandmother   . Heart disease  Maternal Grandmother   . Congestive Heart Failure Maternal Grandmother   . Cancer Maternal Grandfather        lung  . Stroke Paternal Grandfather   . Emphysema Paternal Grandmother   . COPD Neg Hx      Social History   Tobacco Use  . Smoking status: Never Smoker  . Smokeless tobacco: Never Used  Substance Use Topics  . Alcohol use: No  . Drug use: No    Allergies as of 01/20/2020  . (No Known Allergies)    Review of Systems:    All systems reviewed and negative except where noted in HPI.   Physical Exam:  LMP 11/14/2015 (Approximate)  Patient's last menstrual period was 11/14/2015 (approximate). Psych:  Alert and cooperative. Normal Koch and affect. General:   Alert,  Well-developed, well-nourished, pleasant and cooperative in NAD Head:  Normocephalic and  atraumatic.  Some tenderness in the frontal sinus region. Eyes:  Sclera clear, no icterus.   Conjunctiva pink. Ears:  Normal auditory acuity. Lungs:  Respirations even and unlabored.  Clear throughout to auscultation.   No wheezes, crackles, or rhonchi. No acute distress. Heart:  Regular rate and rhythm; no murmurs, clicks, rubs, or gallops. Abdomen:  Normal bowel sounds.  No bruits.  Soft, non-tender and non-distended without masses, hepatosplenomegaly or hernias noted.  No guarding or rebound tenderness.    Extremities:  No clubbing or edema.  No cyanosis. Neurologic:  Alert and oriented x3;  grossly normal neurologically. Psych:  Alert and cooperative. Normal Koch and affect.  Imaging Studies: No results found.  Assessment and Plan:   Stephanie Koch is a 59 y.o. y/o female has been referred for surveillance for Barrett's esophagus and colon cancer screening.  Plan 1.  Barrett's esophagus: Discussed lifestyle changes for acid reflux.    I will perform an EGD and take biopsies to evaluate for Barrett's esophagus and rule out dysplasia.  Based on her history it is possible that she does not have Barrett's esophagus which will come from on endoscopy.  2.  Colon cancer screening colonoscopy  3.  Chronic cough which seems to fit a picture of chronic postnasal drip probably related from sinus issues.  She does have a history of allergies.  Appropriately she is not getting significant relief with PPI usage.  She does not have any other typical symptoms of acid reflux.  I will refer her to ENT for further evaluation.  Probably at some point we will wean her off her PPIs to see if it makes any difference or not.  I have discussed alternative options, risks & benefits,  which include, but are not limited to, bleeding, infection, perforation,respiratory complication & drug reaction.  The patient agrees with this plan & written consent will be obtained.     Follow up in 3 months.  Dr Jonathon Bellows  MD,MRCP(U.K)

## 2020-02-06 ENCOUNTER — Other Ambulatory Visit: Payer: Self-pay | Admitting: Family Medicine

## 2020-02-06 DIAGNOSIS — K219 Gastro-esophageal reflux disease without esophagitis: Secondary | ICD-10-CM

## 2020-02-08 ENCOUNTER — Other Ambulatory Visit: Payer: Self-pay

## 2020-02-08 ENCOUNTER — Ambulatory Visit: Payer: BC Managed Care – PPO | Attending: Internal Medicine

## 2020-02-08 DIAGNOSIS — Z23 Encounter for immunization: Secondary | ICD-10-CM

## 2020-02-08 NOTE — Progress Notes (Signed)
   Covid-19 Vaccination Clinic  Name:  Stephanie Koch    MRN: [id number] DOB: [birth year]  02/08/2020  [name] was observed post Covid-19 immunization for 15 minutes without incidence. She was provided with Vaccine Information Sheet and instruction to access the V-Safe system.   [name] was instructed to call 911 with any severe reactions post vaccine: Marland Kitchen Difficulty breathing  . Swelling of your face and throat  . A fast heartbeat  . A bad rash all over your body  . Dizziness and weakness    Immunizations Administered    Name Date Dose VIS Date Route   Moderna COVID-19 Vaccine 02/08/2020 10:19 AM 0.5 mL 11/12/2019 Intramuscular   Manufacturer: Moderna   Lot: [id number]   NDC: 60165-800-63

## 2020-03-04 ENCOUNTER — Other Ambulatory Visit
Admission: RE | Admit: 2020-03-04 | Discharge: 2020-03-04 | Disposition: A | Discharge status: Home / Self Care | Payer: 59 | Source: Ambulatory Visit | Attending: Gastroenterology | Admitting: Gastroenterology

## 2020-03-04 ENCOUNTER — Other Ambulatory Visit: Payer: Self-pay

## 2020-03-04 DIAGNOSIS — K2270 Barrett's esophagus without dysplasia: Secondary | ICD-10-CM | POA: Diagnosis present

## 2020-03-04 DIAGNOSIS — K2100 Gastro-esophageal reflux disease with esophagitis, without bleeding: Secondary | ICD-10-CM | POA: Diagnosis not present

## 2020-03-04 DIAGNOSIS — Z1211 Encounter for screening for malignant neoplasm of colon: Secondary | ICD-10-CM | POA: Diagnosis not present

## 2020-03-04 DIAGNOSIS — F419 Anxiety disorder, unspecified: Secondary | ICD-10-CM | POA: Diagnosis not present

## 2020-03-04 DIAGNOSIS — Z20822 Contact with and (suspected) exposure to covid-19: Secondary | ICD-10-CM | POA: Diagnosis not present

## 2020-03-04 DIAGNOSIS — E785 Hyperlipidemia, unspecified: Secondary | ICD-10-CM | POA: Diagnosis not present

## 2020-03-04 DIAGNOSIS — Z79899 Other long term (current) drug therapy: Secondary | ICD-10-CM | POA: Diagnosis not present

## 2020-03-04 DIAGNOSIS — Z791 Long term (current) use of non-steroidal anti-inflammatories (NSAID): Secondary | ICD-10-CM | POA: Diagnosis not present

## 2020-03-04 LAB — SARS CORONAVIRUS 2 (TAT 6-24 HRS): SARS Coronavirus 2: NEGATIVE

## 2020-03-06 ENCOUNTER — Ambulatory Visit: Payer: 59 | Admitting: Anesthesiology

## 2020-03-06 ENCOUNTER — Ambulatory Visit
Admission: RE | Admit: 2020-03-06 | Discharge: 2020-03-06 | Disposition: A | Discharge status: Home / Self Care | Payer: 59 | Attending: Gastroenterology | Admitting: Gastroenterology

## 2020-03-06 ENCOUNTER — Encounter
Admission: RE | Disposition: A | Discharge status: Home / Self Care | Payer: Self-pay | Source: Home / Self Care | Attending: Gastroenterology

## 2020-03-06 ENCOUNTER — Encounter: Payer: Self-pay | Admitting: Gastroenterology

## 2020-03-06 DIAGNOSIS — E785 Hyperlipidemia, unspecified: Secondary | ICD-10-CM | POA: Insufficient documentation

## 2020-03-06 DIAGNOSIS — K2270 Barrett's esophagus without dysplasia: Secondary | ICD-10-CM | POA: Insufficient documentation

## 2020-03-06 DIAGNOSIS — F419 Anxiety disorder, unspecified: Secondary | ICD-10-CM | POA: Insufficient documentation

## 2020-03-06 DIAGNOSIS — Z791 Long term (current) use of non-steroidal anti-inflammatories (NSAID): Secondary | ICD-10-CM | POA: Insufficient documentation

## 2020-03-06 DIAGNOSIS — Z1211 Encounter for screening for malignant neoplasm of colon: Secondary | ICD-10-CM | POA: Diagnosis not present

## 2020-03-06 DIAGNOSIS — K22719 Barrett's esophagus with dysplasia, unspecified: Secondary | ICD-10-CM

## 2020-03-06 DIAGNOSIS — Z79899 Other long term (current) drug therapy: Secondary | ICD-10-CM | POA: Insufficient documentation

## 2020-03-06 DIAGNOSIS — Z20822 Contact with and (suspected) exposure to covid-19: Secondary | ICD-10-CM | POA: Insufficient documentation

## 2020-03-06 DIAGNOSIS — K2100 Gastro-esophageal reflux disease with esophagitis, without bleeding: Secondary | ICD-10-CM | POA: Insufficient documentation

## 2020-03-06 HISTORY — PX: ESOPHAGOGASTRODUODENOSCOPY (EGD) WITH PROPOFOL: SHX5813

## 2020-03-06 HISTORY — PX: COLONOSCOPY WITH PROPOFOL: SHX5780

## 2020-03-06 SURGERY — COLONOSCOPY WITH PROPOFOL
Anesthesia: General

## 2020-03-06 MED ORDER — GLYCOPYRROLATE 0.2 MG/ML IJ SOLN
INTRAMUSCULAR | Status: DC | PRN
  Administered 2020-03-06: .2 mg via INTRAVENOUS

## 2020-03-06 MED ORDER — PROPOFOL 500 MG/50ML IV EMUL
INTRAVENOUS | Status: AC
  Filled 2020-03-06: qty 50

## 2020-03-06 MED ORDER — PHENYLEPHRINE HCL (PRESSORS) 10 MG/ML IV SOLN
INTRAVENOUS | Status: DC | PRN
  Administered 2020-03-06 (×2): 100 ug via INTRAVENOUS

## 2020-03-06 MED ORDER — LIDOCAINE HCL (PF) 2 % IJ SOLN
INTRAMUSCULAR | Status: AC
  Filled 2020-03-06: qty 10

## 2020-03-06 MED ORDER — GLYCOPYRROLATE 0.2 MG/ML IJ SOLN
INTRAMUSCULAR | Status: AC
  Filled 2020-03-06: qty 1

## 2020-03-06 MED ORDER — LIDOCAINE 2% (20 MG/ML) 5 ML SYRINGE
INTRAMUSCULAR | Status: DC | PRN
  Administered 2020-03-06: 100 mg via INTRAVENOUS

## 2020-03-06 MED ORDER — PROPOFOL 10 MG/ML IV BOLUS
INTRAVENOUS | Status: DC | PRN
  Administered 2020-03-06: 80 mg via INTRAVENOUS
  Administered 2020-03-06: 20 mg via INTRAVENOUS

## 2020-03-06 MED ORDER — SODIUM CHLORIDE 0.9 % IV SOLN: INTRAVENOUS | Status: DC

## 2020-03-06 MED ORDER — PROPOFOL 500 MG/50ML IV EMUL
INTRAVENOUS | Status: DC | PRN
  Administered 2020-03-06: 200 ug/kg/min via INTRAVENOUS

## 2020-03-06 NOTE — Op Note (Signed)
[facility] Gastroenterology Patient Name: Stephanie Koch Procedure Date: 03/06/2020 8:43 AM MRN: [id number] Account #: [id number] Date of Birth: March 13, 1966 Admit Type: Outpatient Age: 54 Room: [facility] ENDO ROOM 4 Gender: Female Note Status: Finalized Procedure:             Upper GI endoscopy Indications:           Follow-up of Barrett's esophagus Providers:             [name] MD, MD Referring MD:          Danelle Berry (Referring MD) Medicines:             Monitored Anesthesia Care Complications:         No immediate complications. Procedure:             Pre-Anesthesia Assessment:                        - Prior to the procedure, a History and Physical was                         performed, and patient medications, allergies and                         sensitivities were reviewed. The patient's tolerance                         of previous anesthesia was reviewed.                        - The risks and benefits of the procedure and the                         sedation options and risks were discussed with the                         patient. All questions were answered and informed                         consent was obtained.                        - ASA Grade Assessment: II - A patient with mild                         systemic disease.                        After obtaining informed consent, the endoscope was                         passed under direct vision. Throughout the procedure,                         the patient's blood pressure, pulse, and oxygen                         saturations were monitored continuously. The Endoscope                         was introduced through the mouth, and  advanced to the                         third part of duodenum. The upper GI endoscopy was                         accomplished with ease. The patient tolerated the                         procedure well. Findings:      Islands of salmon-colored mucosa were present. No  other visible       abnormalities were present. The maximum longitudinal extent of these       esophageal mucosal changes was 0.6 cm in length. Biopsies were taken       with a cold forceps for histology.      The stomach was normal.      The examined duodenum was normal.      The cardia and gastric fundus were normal on retroflexion. Impression:            - Salmon-colored mucosa suspicious for Barrett's                         esophagus. Biopsied.                        - Normal stomach.                        - Normal examined duodenum. Recommendation:        - Await pathology results.                        - Perform a colonoscopy today. Procedure Code(s):     --- Professional ---                        [phone], Esophagogastroduodenoscopy, flexible,                         transoral; with biopsy, single or multiple Diagnosis Code(s):     --- Professional ---                        K22.70, Barrett's esophagus without dysplasia CPT copyright 2019 American Medical Association. All rights reserved. The codes documented in this report are preliminary and upon coder review may  be revised to meet current compliance requirements. Jonathon Bellows, MD Jonathon Bellows MD, MD 03/06/2020 8:54:07 AM This report has been signed electronically. Number of Addenda: 0 Note Initiated On: 03/06/2020 8:43 AM Estimated Blood Loss:  Estimated blood loss: none.      [facility]

## 2020-03-06 NOTE — Op Note (Signed)
[facility] Gastroenterology Patient Name: Stephanie Koch Procedure Date: 03/06/2020 8:42 AM MRN: [id number] Account #: [id number] Date of Birth: [birth year] Admit Type: Outpatient Age: 54 Room: [facility] ENDO ROOM 4 Gender: Female Note Status: Finalized Procedure:             Colonoscopy Indications:           Screening for colorectal malignant neoplasm Providers:             [name] MD, MD Referring MD:          Danelle Berry (Referring MD) Medicines:             Monitored Anesthesia Care Complications:         No immediate complications. Procedure:             Pre-Anesthesia Assessment:                        - Prior to the procedure, a History and Physical was                         performed, and patient medications, allergies and                         sensitivities were reviewed. The patient's tolerance                         of previous anesthesia was reviewed.                        - The risks and benefits of the procedure and the                         sedation options and risks were discussed with the                         patient. All questions were answered and informed                         consent was obtained.                        - ASA Grade Assessment: II - A patient with mild                         systemic disease.                        After obtaining informed consent, the colonoscope was                         passed under direct vision. Throughout the procedure,                         the patient's blood pressure, pulse, and oxygen                         saturations were monitored continuously. The                         Colonoscope was introduced through the anus and  advanced to the the cecum, identified by the                         appendiceal orifice. The colonoscopy was performed                         with ease. The patient tolerated the procedure well.                         The quality of the  bowel preparation was good. Findings:      The perianal and digital rectal examinations were normal.      The entire examined colon appeared normal on direct and retroflexion       views. Impression:            - The entire examined colon is normal on direct and                         retroflexion views.                        - No specimens collected. Recommendation:        - Discharge patient to home (with escort).                        - Resume previous diet.                        - Continue present medications.                        - Repeat colonoscopy in 10 years for screening                         purposes. Procedure Code(s):     --- Professional ---                        [phone], Colonoscopy, flexible; diagnostic, including                         collection of specimen(s) by brushing or washing, when                         performed (separate procedure) Diagnosis Code(s):     --- Professional ---                        Z12.11, Encounter for screening for malignant neoplasm                         of colon CPT copyright 2019 American Medical Association. All rights reserved. The codes documented in this report are preliminary and upon coder review may  be revised to meet current compliance requirements. Jonathon Bellows, MD Jonathon Bellows MD, MD 03/06/2020 9:09:57 AM This report has been signed electronically. Number of Addenda: 0 Note Initiated On: 03/06/2020 8:42 AM Scope Withdrawal Time: 0 hours 10 minutes 2 seconds  Total Procedure Duration: 0 hours 13 minutes 21 seconds  Estimated Blood Loss:  Estimated blood loss: none.      [facility]

## 2020-03-06 NOTE — Transfer of Care (Signed)
Immediate Anesthesia Transfer of Care Note  Patient: Stephanie Koch  Procedure(s) Performed: COLONOSCOPY WITH PROPOFOL (N/A ) ESOPHAGOGASTRODUODENOSCOPY (EGD) WITH PROPOFOL (N/A )  Patient Location: Endoscopy Unit  Anesthesia Type:General  Level of Consciousness: awake, alert  and oriented  Airway & Oxygen Therapy: Patient connected to nasal cannula oxygen  Post-op Assessment: Post -op Vital signs reviewed and stable  Post vital signs: stable  Last Vitals:  Vitals Value Taken Time  BP 93/62 03/06/20 0916  Temp    Pulse 72 03/06/20 0917  Resp 16 03/06/20 0917  SpO2 100 % 03/06/20 0917  Vitals shown include unvalidated device data.  Last Pain:  Vitals:   03/06/20 0816  TempSrc: Temporal         Complications: No apparent anesthesia complications

## 2020-03-06 NOTE — H&P (Signed)
Jonathon Bellows, MD [address], [place], [place], [place], [address] [address], [place], [place], [place], [address] Phone: [phone]  Fax: [phone]  Primary Care Physician:  Delsa Grana, PA-C   Pre-Procedure History & Physical: HPI:  Stephanie Koch is a 54 y.o. female is here for an endoscopy and colonoscopy    Past Medical History:  Diagnosis Date  . Abnormal Pap smear of cervix 08/2013   ASCUS pap with HR HPV Neg. Needs repeat pap with cotesting 2017.  Marland Kitchen Allergy   . Anxiety   . Barrett's esophagus 07/2010   EGD with biopsy confirmed  . Hyperlipidemia   . 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 09/12/2013   HPV negative; repeat April 2017 NIL, negative HPV    History reviewed. No pertinent surgical history.  Prior to Admission medications   Medication Sig Start Date End Date Taking? Authorizing Provider  cholecalciferol (VITAMIN D) 1000 units tablet Take 1,000 Units by mouth daily.   Yes [provider]  loratadine (CLARITIN) 10 MG tablet Take 10 mg by mouth daily.   Yes [provider]  pantoprazole (PROTONIX) 40 MG tablet Take 1 tablet (40 mg total) by mouth daily. 02/07/20  Yes Delsa Grana, PA-C  Venlafaxine HCl 37.5 MG TB24 Take 1 tablet (37.5 mg total) by mouth daily. 12/30/19  Yes Delsa Grana, PA-C  Calcium Carbonate-Vit D-Min (CALCIUM 1200 PO) Take by mouth.    [provider]  meloxicam (MOBIC) 15 MG tablet Take 1 tablet (15 mg total) by mouth daily. Patient not taking: Reported on 03/06/2020 12/30/19   Delsa Grana, PA-C    Allergies as of 01/20/2020  . (No Known Allergies)    Family History  Problem Relation Age of Onset  . Diabetes Mother   . Hypertension Mother   . Hyperlipidemia Mother   . Stroke Father   . Hypertension Father   . Hyperlipidemia Father   . Cancer Paternal Uncle        bowel  . Diabetes Maternal Grandmother   . Heart disease Maternal Grandmother   . Congestive Heart  Failure Maternal Grandmother   . Cancer Maternal Grandfather        lung  . Stroke Paternal Grandfather   . Emphysema Paternal Grandmother   . COPD Neg Hx     Social History   Socioeconomic History  . Marital status: Married    Spouse name: Fulton Reek  . Number of children: 1  . Years of education: 56  . Highest education level: Some college, no degree  Occupational History  . Not on file  Tobacco Use  . Smoking status: Never Smoker  . Smokeless tobacco: Never Used  Substance and Sexual Activity  . Alcohol use: No  . Drug use: No  . Sexual activity: Not on file  Other Topics Concern  . Not on file  Social History Narrative  . Not on file   Social Determinants of Health   Financial Resource Strain:   . Difficulty of Paying Living Expenses:   Food Insecurity:   . Worried About Charity fundraiser in the Last Year:   . Arboriculturist in the Last Year:   Transportation Needs:   . Film/video editor (Medical):   Marland Kitchen Lack of Transportation (Non-Medical):   Physical Activity:   . Days of Exercise per Week:   . Minutes of Exercise per Session:   Stress:   . Feeling of Stress :  Social Connections:   . Frequency of Communication with Friends and Family:   . Frequency of Social Gatherings with Friends and Family:   . Attends Religious Services:   . Active Member of Clubs or Organizations:   . Attends Banker Meetings:   Marland Kitchen Marital Status:   Intimate Partner Violence:   . Fear of Current or Ex-Partner:   . Emotionally Abused:   Marland Kitchen Physically Abused:   . Sexually Abused:     Review of Systems: See HPI, otherwise negative ROS  Physical Exam: BP 130/88   Pulse 72   Temp 97.7 F (36.5 C) (Temporal)   Resp 16   Ht 5\' 3"  (1.6 m)   Wt 77.1 kg   LMP 11/13/2010   SpO2 100%   BMI 30.11 kg/m  General:   Alert,  pleasant and cooperative in NAD Head:  Normocephalic and atraumatic. Neck:  Supple; no masses or thyromegaly. Lungs:  Clear throughout to  auscultation, normal respiratory effort.    Heart:  +S1, +S2, Regular rate and rhythm, No edema. Abdomen:  Soft, nontender and nondistended. Normal bowel sounds, without guarding, and without rebound.   Neurologic:  Alert and  oriented x4;  grossly normal neurologically.  Impression/Plan: Stephanie Koch is here for an endoscopy and colonoscopy  to be performed for  evaluation of barrettes esophagus and colon cancer screening average risk    Risks, benefits, limitations, and alternatives regarding endoscopy have been reviewed with the patient.  Questions have been answered.  All parties agreeable.   [name], MD  03/06/2020, 8:40 AM

## 2020-03-06 NOTE — Anesthesia Preprocedure Evaluation (Signed)
Anesthesia Evaluation  Patient identified by MRN, date of birth, ID band Patient awake    Reviewed: Allergy & Precautions, H&P , NPO status , Patient's Chart, lab work & pertinent test results, reviewed documented beta blocker date and time   Airway Mallampati: II   Neck ROM: full    Dental  (+) Poor Dentition   Pulmonary neg pulmonary ROS,    Pulmonary exam normal        Cardiovascular negative cardio ROS Normal cardiovascular exam Rhythm:regular Rate:Normal     Neuro/Psych Anxiety negative neurological ROS  negative psych ROS   GI/Hepatic negative GI ROS, Neg liver ROS,   Endo/Other  negative endocrine ROS  Renal/GU negative Renal ROS  negative genitourinary   Musculoskeletal   Abdominal   Peds  Hematology negative hematology ROS (+)   Anesthesia Other Findings Past Medical History: 08/2013: Abnormal Pap smear of cervix     Comment:  ASCUS pap with HR HPV Neg. Needs repeat pap with               cotesting 2017. No date: Allergy No date: Anxiety 07/2010: Barrett's esophagus     Comment:  EGD with biopsy confirmed No date: Hyperlipidemia 09/12/2013: 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     Comment:  HPV negative; repeat April 2017 NIL, negative HPV History reviewed. No pertinent surgical history. BMI    Body Mass Index: 30.11 kg/m     Reproductive/Obstetrics negative OB ROS                             Anesthesia Physical Anesthesia Plan  ASA: III  Anesthesia Plan: General   Post-op Pain Management:    Induction:   PONV Risk Score and Plan:   Airway Management Planned:   Additional Equipment:   Intra-op Plan:   Post-operative Plan:   Informed Consent: I have reviewed the patients History and Physical, chart, labs and discussed the procedure including the risks, benefits and alternatives for the proposed anesthesia with the patient or authorized  representative who has indicated his/her understanding and acceptance.     Dental Advisory Given  Plan Discussed with: CRNA  Anesthesia Plan Comments:         Anesthesia Quick Evaluation

## 2020-03-06 NOTE — Anesthesia Postprocedure Evaluation (Signed)
Anesthesia Post Note  Patient: CHERALYN OLIVER  Procedure(s) Performed: COLONOSCOPY WITH PROPOFOL (N/A ) ESOPHAGOGASTRODUODENOSCOPY (EGD) WITH PROPOFOL (N/A )  Patient location during evaluation: PACU Anesthesia Type: General Level of consciousness: awake and alert Pain management: pain level controlled Vital Signs Assessment: post-procedure vital signs reviewed and stable Respiratory status: spontaneous breathing, nonlabored ventilation, respiratory function stable and patient connected to nasal cannula oxygen Cardiovascular status: blood pressure returned to baseline and stable Postop Assessment: no apparent nausea or vomiting Anesthetic complications: no     Last Vitals:  Vitals:   03/06/20 0940 03/06/20 0950  BP: 135/85 (!) 139/92  Pulse: 63 73  Resp: (!) 21 16  Temp:    SpO2: 98% 100%    Last Pain:  Vitals:   03/06/20 0920  TempSrc: Temporal                 Yevette Edwards

## 2020-03-07 ENCOUNTER — Ambulatory Visit: Payer: PRIVATE HEALTH INSURANCE

## 2020-03-09 ENCOUNTER — Encounter: Payer: Self-pay | Admitting: Gastroenterology

## 2020-03-09 LAB — SURGICAL PATHOLOGY

## 2020-03-10 ENCOUNTER — Telehealth: Payer: Self-pay

## 2020-03-10 ENCOUNTER — Ambulatory Visit: Payer: PRIVATE HEALTH INSURANCE

## 2020-03-10 NOTE — Telephone Encounter (Signed)
-----   Message from [name], MD sent at 03/09/2020  2:12 PM EDT ----- Stephanie Koch : Inform patient that the biopsies taken during her recent upper endoscopy confirm that she does not have Barrett's esophagus.  She only has acid reflux and related esophagitis.  She should continue on a PPI.  Please schedule a telephone visit or a follow-up at some point in the near future to discuss about long-term management.  C/c Danelle Berry, PA-C    Dr [name] MD,MRCP [facility]) Gastroenterology/Hepatology Pager: [phone]

## 2020-03-10 NOTE — Telephone Encounter (Signed)
Called pt to inform her of biopsy result and Dr. Johnney Killian recommendations.  Unable to contact, LVM to return call

## 2020-03-11 ENCOUNTER — Ambulatory Visit: Payer: PRIVATE HEALTH INSURANCE

## 2020-03-12 NOTE — Telephone Encounter (Signed)
Spoke with pt and informed her of biopsy results and Dr. Anna's recommendations. Pt understands and agrees. 

## 2020-03-12 NOTE — Telephone Encounter (Signed)
Patient returned call for results 

## 2020-03-14 ENCOUNTER — Ambulatory Visit: Payer: PRIVATE HEALTH INSURANCE | Attending: Internal Medicine

## 2020-03-14 DIAGNOSIS — Z23 Encounter for immunization: Secondary | ICD-10-CM

## 2020-03-14 NOTE — Progress Notes (Signed)
   Covid-19 Vaccination Clinic  Name:  Stephanie Koch    MRN: [id number] DOB: [birth year]  03/14/2020  Stephanie Koch was observed post Covid-19 immunization for 15 minutes without incident. She was provided with Vaccine Information Sheet and instruction to access the V-Safe system.   Stephanie Koch was instructed to call 911 with any severe reactions post vaccine: Marland Kitchen Difficulty breathing  . Swelling of face and throat  . A fast heartbeat  . A bad rash all over body  . Dizziness and weakness   Immunizations Administered    Name Date Dose VIS Date Route   Moderna COVID-19 Vaccine 03/14/2020  8:26 AM 0.5 mL 11/12/2019 Intramuscular   Manufacturer: Gala Murdoch   Lot: [id number]   NDC: 30104-045-91

## 2020-04-29 ENCOUNTER — Ambulatory Visit: Payer: PRIVATE HEALTH INSURANCE | Admitting: Gastroenterology

## 2020-05-05 ENCOUNTER — Other Ambulatory Visit: Payer: Self-pay | Admitting: Family Medicine

## 2020-05-05 DIAGNOSIS — K219 Gastro-esophageal reflux disease without esophagitis: Secondary | ICD-10-CM

## 2020-05-28 IMAGING — CR LEFT THUMB 2+V
1 series · 4 of 4 positions shown · non-contrast
Comparison: None.

CLINICAL DATA: Left thumb swelling

EXAM:
LEFT THUMB 2+V

[Series 1: dg finger thumb left · 0.14mm/px · 4 of 4 slices shown]
[im 1/4]
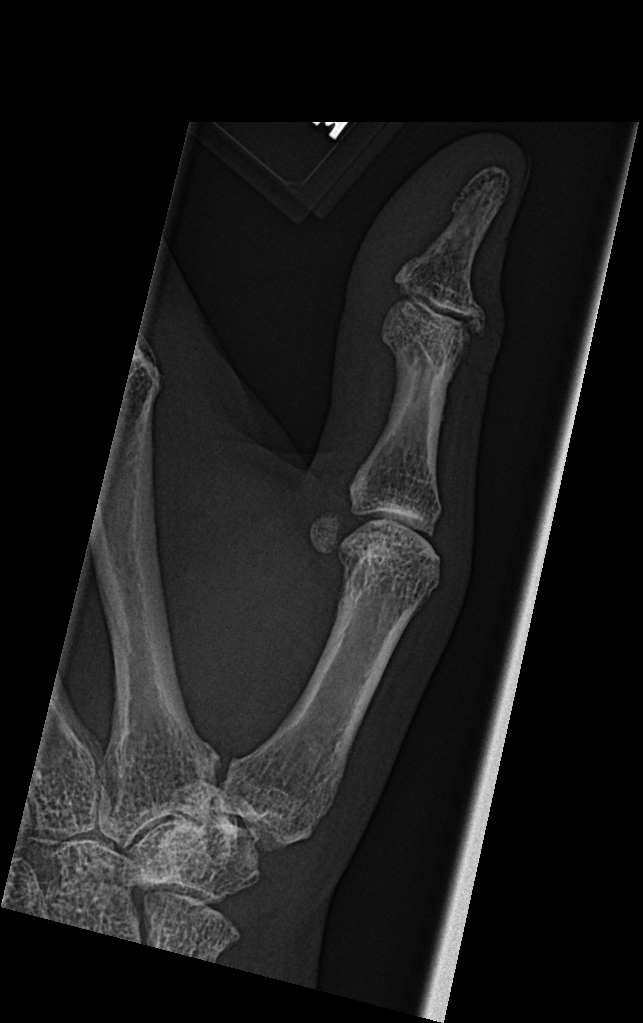
[im 2/4]
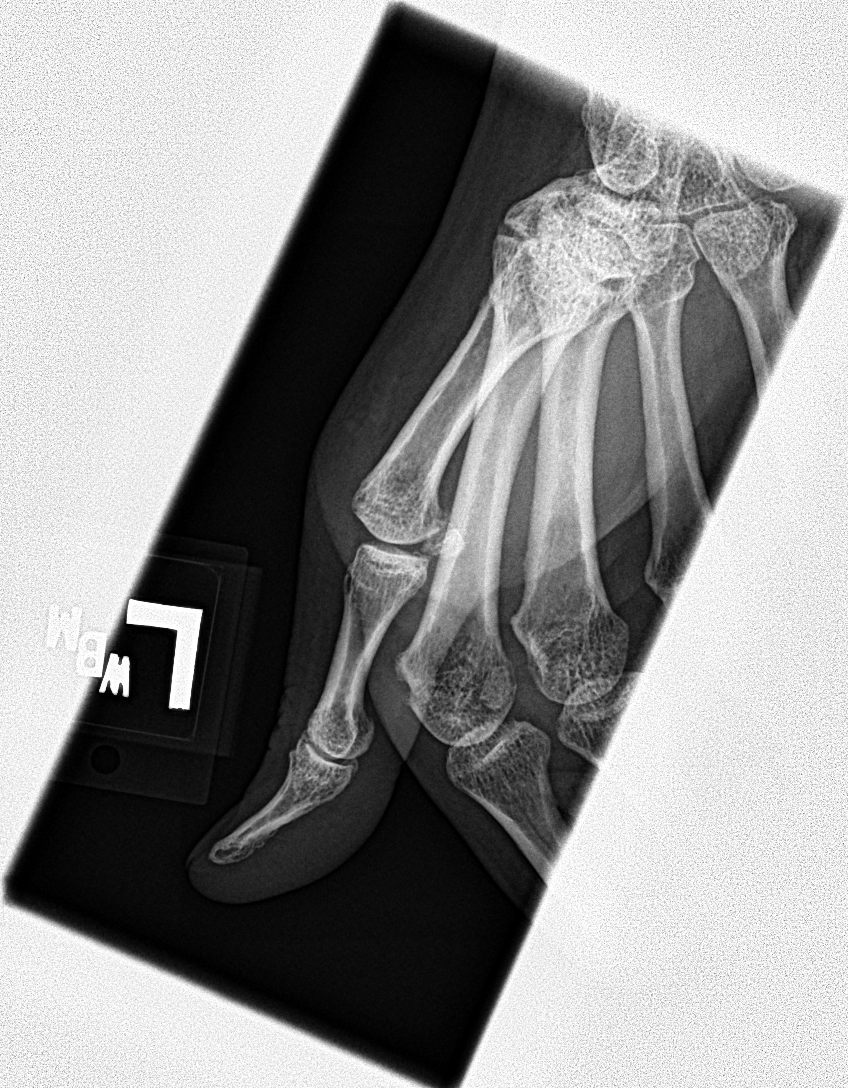
[im 3/4]
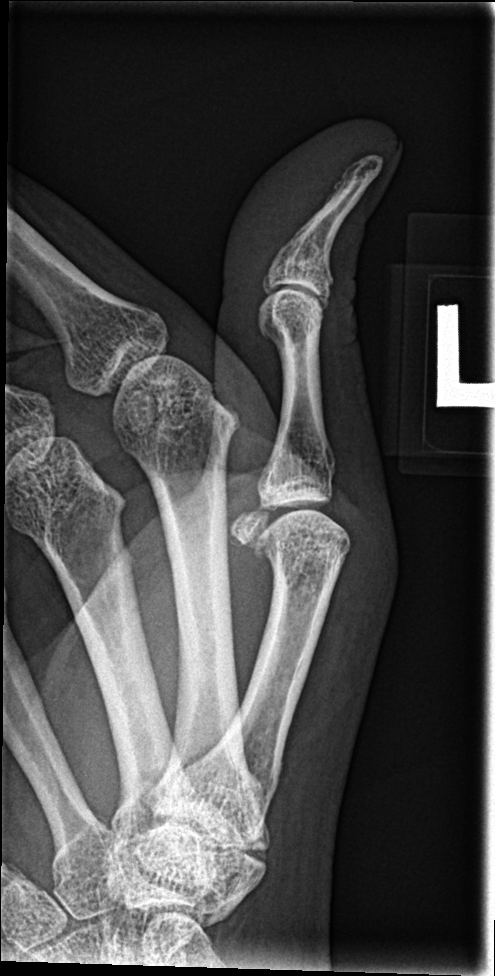
[im 4/4]
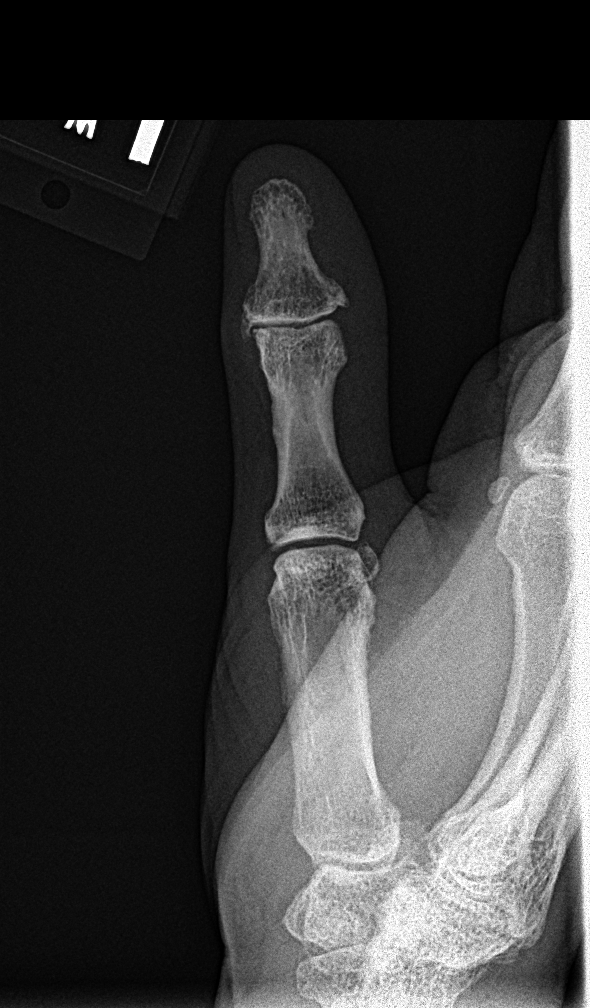

[4 of 4 positions shown; findings below may reference images not displayed]

FINDINGS: There is no evidence of fracture or dislocation. There is mild
osteoarthritis of the first MCP joint and first CMC joint. There is
mild osteoarthritis of the first IP joint.
IMPRESSION: No acute osseous injury of the left thumb.

## 2020-06-16 ENCOUNTER — Telehealth: Payer: Self-pay

## 2020-06-16 NOTE — Telephone Encounter (Signed)
Changed the appointment

## 2020-06-16 NOTE — Telephone Encounter (Signed)
Patient is wanting to know if her appointment on 06/17/2020 can be virtual

## 2020-06-17 ENCOUNTER — Telehealth (INDEPENDENT_AMBULATORY_CARE_PROVIDER_SITE_OTHER): Payer: 59 | Admitting: Gastroenterology

## 2020-06-17 DIAGNOSIS — K2100 Gastro-esophageal reflux disease with esophagitis, without bleeding: Secondary | ICD-10-CM

## 2020-06-17 NOTE — Progress Notes (Signed)
[name] , MD [address]  [address]  [place], [place] [address]  Main: [phone]  Fax: [phone]   Primary Care Physician: Danelle Berry, PA-C  Virtual Visit via Video Note: Initial plan was for video visit but was later changed to a telephone visit at the patient was outside of her house  I connected with patient on 06/17/20 at  9:00 AM EDT by video and verified that I am speaking with the correct person using two identifiers.   I discussed the limitations, risks, security and privacy concerns of performing an evaluation and management service by video  and the availability of in person appointments. I also discussed with the patient that there may be a patient responsible charge related to this service. The patient expressed understanding and agreed to proceed.  Location of Patient: Home Location of Provider: Home Persons involved: Patient and provider only   History of Present Illness:   Discuss results of recent EGD  HPI: Stephanie Koch is a 54 y.o. female  Summary of history :  Initially referred and seen in February 2021 to evaluate for Barrett's esophagus and colon cancer screening.  Back in 2011 she was diagnosed with Barrett's esophagus by Dr. Bluford Kaufmann .  She recalls that he did not find any abnormalities in the esophagus but the biopsies showed that she had Barrett's esophagus and was told to return probably in a year for repeat endoscopy.  She is not a smoker.  No first-degree relatives with esophageal cancer.  In fact she actually denies any clear history of acid reflux.  Her main symptoms are chronic cough which she has had for more than 10 or 15 years.  She does give a history of postnasal drip and pain in the frontal sinus region which is worse when bending forward and coughing.  She does suffer from allergies.  She has taken Protonix for more than 15 years.  She takes it first thing in the morning.  It does help her with some relief but does not help with  the cough.  When she was in the office I did hear a cough it was a very congested form of coughing which I could guess she had some mucus .    Interval history 01/20/2020-06/17/2020  02/15/2020: EGD: [place] of salmon-colored mucosa were seen at the lower end of the esophagus.  Less than 1 cm in length.  Biopsies were taken.  Demonstrated squamocolumnar junction mucosa with features of reflux esophagitis and no evidence of Barrett's esophagus.  02/15/2020: Colonoscopy: Normal colonoscopy repeat in 10 years.   She is doing well on Protonix 40 mg a day.  She recollect she has had acid reflux for more than 10 to 15 years.  She thinks that most of her coughing symptoms are due to allergies and recollects when she is out of the house her symptoms are worse.  She has not seen ENT but is aware that if she wishes to see them we will be happy to refer   Current Outpatient Medications  Medication Sig Dispense Refill  . Calcium Carbonate-Vit D-Min (CALCIUM 1200 PO) Take by mouth.    . cholecalciferol (VITAMIN D) 1000 units tablet Take 1,000 Units by mouth daily.    Marland Kitchen loratadine (CLARITIN) 10 MG tablet Take 10 mg by mouth daily.    . meloxicam (MOBIC) 15 MG tablet Take 1 tablet (15 mg total) by mouth daily. (Patient not taking: Reported on 03/06/2020) 30 tablet 2  . pantoprazole (  PROTONIX) 40 MG tablet Take 1 tablet by mouth once daily 90 tablet 0  . Venlafaxine HCl 37.5 MG TB24 Take 1 tablet (37.5 mg total) by mouth daily. 90 tablet 3   No current facility-administered medications for this visit.    Allergies as of 06/17/2020  . (No Known Allergies)    Review of Systems:    All systems reviewed and negative except where noted in HPI.  General Appearance:    Alert, cooperative, no distress, appears stated age  Head:    Normocephalic, without obvious abnormality, atraumatic  Eyes:    PERRL, conjunctiva/corneas clear,  Ears:    Grossly normal hearing    Neurologic:  Grossly normal     Observations/Objective:  Labs: CMP     Component Value Date/Time   NA 142 12/30/2019 0859   NA 142 03/23/2016 0933   K 3.9 12/30/2019 0859   CL 106 12/30/2019 0859   CO2 30 12/30/2019 0859   GLUCOSE 90 12/30/2019 0859   BUN 16 12/30/2019 0859   BUN 15 03/23/2016 0933   CREATININE 0.85 12/30/2019 0859   CALCIUM 8.8 12/30/2019 0859   PROT 6.8 12/30/2019 0859   PROT 7.6 03/23/2016 0933   ALBUMIN 4.1 03/27/2017 0927   ALBUMIN 4.5 03/23/2016 0933   AST 13 12/30/2019 0859   ALT 11 12/30/2019 0859   ALKPHOS 43 03/27/2017 0927   BILITOT 0.7 12/30/2019 0859   BILITOT 0.7 03/23/2016 0933   GFRNONAA 79 12/30/2019 0859   GFRAA 91 12/30/2019 0859   Lab Results  Component Value Date   WBC 6.3 12/30/2019   HGB 12.6 12/30/2019   HCT 37.5 12/30/2019   MCV 92.8 12/30/2019   PLT 299 12/30/2019    Imaging Studies: No results found.  Assessment and Plan:   Stephanie Koch is a 75 y.o. y/o female here to follow-up to discuss results of recent EGD and colonoscopy.  Prior history of endoscopy and labeled as Barrett's esophagus.  No clear abnormalities were noted in the esophagus during the procedure but biopsies were taken which suggested the same.  I repeated the endoscopy in March 2021 there were islands of salmon-colored mucosa but biopsies did not show features of Barrett's esophagus.  Presently the guidelines suggest to avoid biopsies at the GE junction as often due to false positive diagnosis of Barrett's esophagus.  This patient has had a repeat endoscopy by myself and there is no evidence of Barrett's esophagus.  She would not require any further surveillance.  She does have reflux esophagitis on the biopsy suggestive of ongoing acid reflux.   Plan 1.  Suggest lifestyle changes for GERD.  Patient information provided.  Continue on Protonix suggested to decrease dose to 20 mg a day.  In 6 months suggest to consider changing from Protonix to famotidine 40 mg a day to see if she can  have her symptoms managed with a H2 receptor blocker.  In terms of reflux management the ideal scenario is to use the least amount of medication for the shortest period  Of time.  Since she has reflux esophagitis and has had symptoms for many years I believe she would need to be on some form of medication for acid reflux long-term.  Ideally it would be best if we can avoid PPI long term  and transition her to an H2 receptor blocker.  At times it is not possible and then we would need to continue her PPI at the lowest dose for long-term   Follow-up  as needed   I discussed the assessment and treatment plan with the patient. The patient was provided an opportunity to ask questions and all were answered. The patient agreed with the plan and demonstrated an understanding of the instructions.   The patient was advised to call back or seek an in-person evaluation if the symptoms worsen or if the condition fails to improve as anticipated.  I provided 15  minutes of face-to-face time during this encounter.  Dr [name] MD,MRCP [facility]) Gastroenterology/Hepatology Pager: [phone]   Speech recognition software was used to dictate this note.

## 2020-07-23 ENCOUNTER — Other Ambulatory Visit: Payer: Self-pay | Admitting: Family Medicine

## 2020-07-23 DIAGNOSIS — K219 Gastro-esophageal reflux disease without esophagitis: Secondary | ICD-10-CM

## 2020-09-30 ENCOUNTER — Other Ambulatory Visit: Payer: Self-pay

## 2020-09-30 ENCOUNTER — Ambulatory Visit (INDEPENDENT_AMBULATORY_CARE_PROVIDER_SITE_OTHER): Payer: 59 | Admitting: Family Medicine

## 2020-09-30 ENCOUNTER — Encounter: Payer: Self-pay | Admitting: Family Medicine

## 2020-09-30 VITALS — BP 120/82 | HR 75 | Temp 98.2°F | Resp 14 | Ht 64.0 in | Wt 177.2 lb

## 2020-09-30 DIAGNOSIS — Z0289 Encounter for other administrative examinations: Secondary | ICD-10-CM

## 2020-09-30 DIAGNOSIS — Z021 Encounter for pre-employment examination: Secondary | ICD-10-CM | POA: Diagnosis not present

## 2020-09-30 NOTE — Progress Notes (Signed)
Patient ID: Stephanie Koch, female    DOB: August 11, 1966, 54 y.o.   MRN: [id number]  PCP: Danelle Berry, PA-C  Chief Complaint  Patient presents with  . Form Completion    ABSS    Subjective:   Stephanie Koch is a 54 y.o. 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 Preventative health care 03/27/2017  . Breast cancer screening 03/23/2016  . Cervical cancer screening 03/23/2016  . Screening-pulmonary TB 12/09/2015  . Seasonal allergic rhinitis 05/26/2015  . Mixed hyperlipidemia 05/26/2015  . Anxiety state 05/26/2015  . Menopausal state 05/26/2015  . Overweight (BMI 25.0-29.9) 05/26/2015  . 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 09/12/2013      Current Outpatient Medications:  .  Calcium Carbonate-Vit D-Min (CALCIUM 1200 PO), Take by mouth., Disp: , Rfl:  .  cholecalciferol (VITAMIN D) 1000 units tablet, Take 1,000 Units by mouth daily., Disp: , Rfl:  .  fluticasone (FLONASE) 50 MCG/ACT nasal spray, Place into both nostrils., Disp: , Rfl:  .  loratadine (CLARITIN) 10 MG tablet, Take 10 mg by mouth daily., Disp: , Rfl:  .  pantoprazole (PROTONIX) 40 MG tablet, Take 1 tablet by mouth once daily, Disp: 90 tablet, Rfl: 3 .  Venlafaxine HCl 37.5 MG TB24, Take 1 tablet (37.5 mg total) by mouth daily., Disp: 90 tablet, Rfl: 3   No Known Allergies   Social History   Tobacco Use  . Smoking status: Never Smoker  . Smokeless tobacco: Never Used  Vaping Use  . Vaping Use: Never used  Substance Use Topics  . Alcohol use: No  . Drug use: No      Chart Review Today: I personally reviewed active problem list, medication list, allergies, family  history, social history, health maintenance, notes from last encounter, lab results, imaging with the patient/caregiver today.   Review of Systems 10 Systems reviewed and are negative for acute change except as noted in the HPI.     Objective:   Vitals:   09/30/20 1145  BP: 120/82  Pulse: 75  Resp: 14  Temp: 98.2 F (36.8 C)  TempSrc: Oral  SpO2: 99%  Weight: 177 lb 3.2 oz (80.4 kg)  Height: 5\' 4"  (1.626 m)    Body mass index is 30.42 kg/m.  Physical Exam Vitals and nursing note reviewed.  Constitutional:      General: She is not in acute distress.    Appearance: Normal appearance. She is not ill-appearing, toxic-appearing or diaphoretic.  HENT:     Head: Normocephalic and atraumatic.     Right Ear: External ear normal.     Left Ear: External ear normal.  Eyes:     General:        Right eye: No discharge.        Left eye: No discharge.     Conjunctiva/sclera: Conjunctivae normal.  Cardiovascular:     Rate and Rhythm: Normal rate and regular rhythm.     Pulses: Normal pulses.     Heart sounds: Normal heart sounds.  Pulmonary:     Effort: Pulmonary effort is normal.     Breath sounds: Normal breath sounds.  Abdominal:     General: Bowel sounds are normal.     Palpations: Abdomen is soft.  Skin:    General: Skin is warm and dry.  Neurological:     Mental Status: She is alert. Mental status is at baseline.  Psychiatric:        Mood and Affect: Mood normal.        Behavior: Behavior normal.      Results for orders placed or performed during the hospital encounter of 03/06/20  Surgical pathology  Result Value Ref Range   SURGICAL PATHOLOGY      SURGICAL PATHOLOGY CASE: [id number] PATIENT: Geanie Cooley Surgical Pathology Report     Specimen Submitted: A. Esophagus, lower; cbx  Clinical History: Colonoscopy screening Z12.11 EGD screening for Barrett's K22.70. Probable Barrett's esophagus; normal colonoscopy.      DIAGNOSIS: A. ESOPHAGUS,  LOWER; COLD BIOPSY: - SQUAMOCOLUMNAR JUNCTION WITH FEATURES OF REFLUX ESOPHAGITIS. - NEGATIVE FOR INTESTINAL METAPLASIA, DYSPLASIA, AND MALIGNANCY.   GROSS DESCRIPTION: A. Labeled: C BX lower esophagus Received: Formalin Tissue fragment(s): Multiple Size: Aggregate, 0.5 x 0.3 x 0.1 cm Description: Pale-tan soft tissue fragments Entirely submitted in 1 cassette.    Final Diagnosis performed by Georgeanna Harrison, MD.   Electronically signed 03/09/2020 8:50:05AM The electronic signature indicates that the named Attending Pathologist has evaluated the specimen Technical component performed at [facility], [address], [place], [place] [address] Lab: [phone] Dir : Jolene Schimke, MD, MMM  Professional component performed at [facility], [facility], [address] [place], [place], [place] [address] Lab: [phone] Dir: Georgiann Cocker. Oneita Kras, MD        Assessment & Plan:     ICD-10-CM   1. Encounter for completion of form with patient  Z02.89    ABSS forms completed - see chart for scanned in copy  2. Physical exam, pre-employment  Z02.1    no restrictions, she does not have immunization records available, reviewed titer testing that she may need, encouraged her to call ABSS and return for labs        Danelle Berry, PA-C 09/30/20 12:02 PM

## 2020-12-30 ENCOUNTER — Encounter: Payer: 59 | Admitting: Family Medicine

## 2021-01-20 ENCOUNTER — Ambulatory Visit (INDEPENDENT_AMBULATORY_CARE_PROVIDER_SITE_OTHER): Payer: BC Managed Care – PPO | Admitting: Family Medicine

## 2021-01-20 ENCOUNTER — Encounter: Payer: Self-pay | Admitting: Family Medicine

## 2021-01-20 ENCOUNTER — Other Ambulatory Visit: Payer: Self-pay

## 2021-01-20 VITALS — BP 126/78 | HR 84 | Temp 98.5°F | Resp 16 | Ht 64.0 in | Wt 175.6 lb

## 2021-01-20 DIAGNOSIS — J329 Chronic sinusitis, unspecified: Secondary | ICD-10-CM

## 2021-01-20 DIAGNOSIS — Z823 Family history of stroke: Secondary | ICD-10-CM

## 2021-01-20 DIAGNOSIS — Z1231 Encounter for screening mammogram for malignant neoplasm of breast: Secondary | ICD-10-CM

## 2021-01-20 DIAGNOSIS — Z23 Encounter for immunization: Secondary | ICD-10-CM

## 2021-01-20 DIAGNOSIS — K219 Gastro-esophageal reflux disease without esophagitis: Secondary | ICD-10-CM

## 2021-01-20 DIAGNOSIS — F411 Generalized anxiety disorder: Secondary | ICD-10-CM

## 2021-01-20 DIAGNOSIS — Z0000 Encounter for general adult medical examination without abnormal findings: Secondary | ICD-10-CM | POA: Diagnosis not present

## 2021-01-20 DIAGNOSIS — J310 Chronic rhinitis: Secondary | ICD-10-CM

## 2021-01-20 DIAGNOSIS — Z1159 Encounter for screening for other viral diseases: Secondary | ICD-10-CM

## 2021-01-20 DIAGNOSIS — Z833 Family history of diabetes mellitus: Secondary | ICD-10-CM

## 2021-01-20 DIAGNOSIS — E782 Mixed hyperlipidemia: Secondary | ICD-10-CM

## 2021-01-20 MED ORDER — ZOSTER VAC RECOMB ADJUVANTED 50 MCG/0.5ML IM SUSR: 0.5000 mL | Freq: Once | INTRAMUSCULAR | 1 refills | Status: AC

## 2021-01-20 MED ORDER — FLUTICASONE PROPIONATE 50 MCG/ACT NA SUSP: 2.0000 | Freq: Every day | NASAL | 5 refills | Status: DC

## 2021-01-20 MED ORDER — VENLAFAXINE HCL ER 37.5 MG PO TB24: 1.0000 | ORAL_TABLET | Freq: Every day | ORAL | 3 refills | Status: DC

## 2021-01-20 NOTE — Patient Instructions (Addendum)
Health Maintenance  Topic Date Due  .  Hepatitis C: One time screening is recommended by Center for Disease Control  (CDC) for  adults born from [birth year] through 1964.   Never done  . HIV Screening  Never done  . Mammogram  Never done  . COVID-19 Vaccine (3 - Booster for Moderna series) 09/13/2020  . Pap Smear  12/29/2022  . Tetanus Vaccine  06/27/2027  . Colon Cancer Screening  03/06/2030  . Flu Shot  Completed   [facility] [place],  [place]  [address] Get Driving Directions Main: [phone]   Recommendations on cholesterol and starting statins.    There is a benefit from LDL-C (bad cholesterol) lowering with statin therapy at virtually all levels of cardiovascular risk.  If statin therapy had no side effects and caused no financial burden, it might be reasonable to recommend it to virtually all at-risk individuals, similar to a healthy diet and exercise  It is this good of a medication!!  It reduces risk in almost everyone.   There are possible side effects with ALL medications and with statins there is a small subset of the population who may not metabolize it well, which causing muscle aches as a side effect (~5%).  We monitor for this, can test for this, and usually are careful to work with you to get a medication that gives you the benefits with minimal side effects.  Some people are sensitive to medications in general and we try to use the highest dose tolerated to give the most benefit.  [place] [facility] cholesterol and statin guidelines are as follows: In adults 77 to 54 years of age without diabetes mellitus and with LDL-C levels ?50, at a 10-year atherosclerotic cardiovascular disease risk of ?7.5 percent, start a moderate-intensity statin if a discussion of treatment options favors statin therapy  If LDL is >160, statins are indicated.  Patients with other significant risk factors  would also benefit from statin.  Some of these factors include a family history of premature cardiovascular disease, chronic kidney disease, or chronic inflammatory disorder (such as chronic human immunodeficiency viral infection).  Can get more information at the following website:  PromotionalLoans.co.za   Lab Results  Component Value Date   CHOL 264 (H) 12/30/2019   CHOL 239 (H) 12/26/2018   CHOL 249 (H) 11/28/2018   Lab Results  Component Value Date   HDL 60 12/30/2019   HDL 53 12/26/2018   HDL 60 11/28/2018   Lab Results  Component Value Date   LDLCALC 178 (H) 12/30/2019   LDLCALC 154 (H) 12/26/2018   LDLCALC 161 (H) 11/28/2018   Lab Results  Component Value Date   TRIG 125 12/30/2019   TRIG 185 (H) 12/26/2018   TRIG 152 (H) 11/28/2018   Lab Results  Component Value Date   CHOLHDL 4.4 12/30/2019   CHOLHDL 4.5 12/26/2018   CHOLHDL 4.2 11/28/2018   No results found for: LDLDIRECT   The 10-year ASCVD risk score Mikey Bussing DC Jr., et al., 2013) is: 2%   Values used to calculate the score:     Age: 39 years     Sex: Female     Is Non-Hispanic African American: No     Diabetic: No     Tobacco smoker: No     Systolic Blood Pressure: 956 mmHg     Is BP treated: No     HDL Cholesterol: 60 mg/dL  Total Cholesterol: 264 mg/dL     Preventive Care 63-32 Years Old, Female Preventive care refers to lifestyle choices and visits with your health care provider that can promote health and wellness. This includes:  A yearly physical exam. This is also called an annual wellness visit.  Regular dental and eye exams.  Immunizations.  Screening for certain conditions.  Healthy lifestyle choices, such as: ? Eating a healthy diet. ? Getting regular exercise. ? Not using drugs or products that contain nicotine and tobacco. ? Limiting alcohol use. What can I expect for my preventive care visit? Physical  exam Your health care provider will check your:  Height and weight. These may be used to calculate your BMI (body mass index). BMI is a measurement that tells if you are at a healthy weight.  Heart rate and blood pressure.  Body temperature.  Skin for abnormal spots. Counseling Your health care provider may ask you questions about your:  Past medical problems.  Family's medical history.  Alcohol, tobacco, and drug use.  Emotional well-being.  Home life and relationship well-being.  Sexual activity.  Diet, exercise, and sleep habits.  Work and work Statistician.  Access to firearms.  Method of birth control.  Menstrual cycle.  Pregnancy history. What immunizations do I need? Vaccines are usually given at various ages, according to a schedule. Your health care provider will recommend vaccines for you based on your age, medical history, and lifestyle or other factors, such as travel or where you work.   What tests do I need? Blood tests  Lipid and cholesterol levels. These may be checked every 5 years, or more often if you are over 4 years old.  Hepatitis C test.  Hepatitis B test. Screening  Lung cancer screening. You may have this screening every year starting at age 62 if you have a 30-pack-year history of smoking and currently smoke or have quit within the past 15 years.  Colorectal cancer screening. ? All adults should have this screening starting at age 80 and continuing until age 20. ? Your health care provider may recommend screening at age 25 if you are at increased risk. ? You will have tests every 1-10 years, depending on your results and the type of screening test.  Diabetes screening. ? This is done by checking your blood sugar (glucose) after you have not eaten for a while (fasting). ? You may have this done every 1-3 years.  Mammogram. ? This may be done every 1-2 years. ? Talk with your health care provider about when you should start having  regular mammograms. This may depend on whether you have a family history of breast cancer.  BRCA-related cancer screening. This may be done if you have a family history of breast, ovarian, tubal, or peritoneal cancers.  Pelvic exam and Pap test. ? This may be done every 3 years starting at age 44. ? Starting at age 35, this may be done every 5 years if you have a Pap test in combination with an HPV test. Other tests  STD (sexually transmitted disease) testing, if you are at risk.  Bone density scan. This is done to screen for osteoporosis. You may have this scan if you are at high risk for osteoporosis. Talk with your health care provider about your test results, treatment options, and if necessary, the need for more tests. Follow these instructions at home: Eating and drinking  Eat a diet that includes fresh fruits and vegetables, whole grains, lean protein, and  low-fat dairy products.  Take vitamin and mineral supplements as recommended by your health care provider.  Do not drink alcohol if: ? Your health care provider tells you not to drink. ? You are pregnant, may be pregnant, or are planning to become pregnant.  If you drink alcohol: ? Limit how much you have to 0-1 drink a day. ? Be aware of how much alcohol is in your drink. In the [place] one drink equals one 12 oz bottle of beer (355 mL), one 5 oz glass of wine (148 mL), or one 1 oz glass of hard liquor (44 mL).   Lifestyle  Take daily care of your teeth and gums. Brush your teeth every morning and night with fluoride toothpaste. Floss one time each day.  Stay active. Exercise for at least 30 minutes 5 or more days each week.  Do not use any products that contain nicotine or tobacco, such as cigarettes, e-cigarettes, and chewing tobacco. If you need help quitting, ask your health care provider.  Do not use drugs.  If you are sexually active, practice safe sex. Use a condom or other form of protection to prevent STIs  (sexually transmitted infections).  If you do not wish to become pregnant, use a form of birth control. If you plan to become pregnant, see your health care provider for a prepregnancy visit.  If told by your health care provider, take low-dose aspirin daily starting at age 17.  Find healthy ways to cope with stress, such as: ? Meditation, yoga, or listening to music. ? Journaling. ? Talking to a trusted person. ? Spending time with friends and family. Safety  Always wear your seat belt while driving or riding in a vehicle.  Do not drive: ? If you have been drinking alcohol. Do not ride with someone who has been drinking. ? When you are tired or distracted. ? While texting.  Wear a helmet and other protective equipment during sports activities.  If you have firearms in your house, make sure you follow all gun safety procedures. What's next?  Visit your health care provider once a year for an annual wellness visit.  Ask your health care provider how often you should have your eyes and teeth checked.  Stay up to date on all vaccines. This information is not intended to replace advice given to you by your health care provider. Make sure you discuss any questions you have with your health care provider. Document Revised: 09/01/2020 Document Reviewed: 08/09/2018 Elsevier Patient Education  2021 [employer].

## 2021-01-20 NOTE — Progress Notes (Signed)
Patient: Stephanie Koch, Female    DOB: [birth year], 55 y.o.   MRN: [id number] Delsa Grana, PA-C Visit Date: 01/20/2021  Today's Provider: Delsa Grana, PA-C   Chief Complaint  Patient presents with  . Annual Exam   Subjective:   Annual physical exam:  Stephanie Koch is a 55 y.o. female who presents today for complete physical exam:  Exercise/Activity: Very active with her job she is getting about [phone] steps daily Diet/nutrition:  Improving diet, got an air fryer, trying to eat healthier foods and avoid fried foods Sleep: Sleeping well  HLD-history of suspected familial hyperlipidemia she has not on medications though she believes she was given some many years ago in the past and she did fine with it for short period of time Over the past couple years her cholesterol has gradually increased We have reviewed her ASCVD risk score and her family history which includes stroke in her father at a young age-12 years old Lab Results  Component Value Date   CHOL 264 (H) 12/30/2019   HDL 60 12/30/2019   [place] 178 (H) 12/30/2019   TRIG 125 12/30/2019   CHOLHDL 4.4 12/30/2019   GERD:  On protonix daily, sometimes has flare ups Saw GI and did her colonoscopy, she does continue to take PPI daily  Rhinitis on flonase and loratadine -seems to be chronic she previously consulted with ENT, sometimes she feels like she needs to change her medications occasionally to get a better response  Anxiety:  Perimenopausal sx and anxiety is well controlled on effexor 37.5 -she is to have severe social anxiety and had difficulty going to restaurants or ordering her meal and she is always been a little anxious states that is the way "she was wired" since starting Effexor for her perimenopausal symptoms she noticed that it does help manage her anxiety symptoms she states her mood is good GAD 7 : Generalized Anxiety Score 12/09/2015  Nervous, Anxious, on Edge 1  Control/stop worrying 1  Worry too  much - different things 0  Trouble relaxing 0  Restless 0  Easily annoyed or irritable 0  Afraid - awful might happen 1  Total GAD 7 Score 3  Anxiety Difficulty Not difficult at all   Depression screen [facility] 2/9 01/20/2021 09/30/2020 12/30/2019  Decreased Interest 0 0 0  Down, Depressed, Hopeless 0 0 0  PHQ - 2 Score 0 0 0  Altered sleeping 0 - 0  Tired, decreased energy 0 - 0  Change in appetite 0 - 0  Feeling bad or failure about yourself  0 - 0  Trouble concentrating 0 - 0  Moving slowly or fidgety/restless 0 - 0  Suicidal thoughts 0 - 0  PHQ-9 Score 0 - 0  Difficult doing work/chores Not difficult at all - Not difficult at all        Pt wished todo routine f/up on chronic conditions today in addition to CPE. Advised pt of separate visit billing/coding  USPSTF grade A and B recommendations - reviewed and addressed today  Depression:  Phq 9 completed today by patient, was reviewed by me with patient in the room PHQ score is negative, pt feels good, reviewed and see above PHQ 2/9 Scores 01/20/2021 09/30/2020 12/30/2019 11/13/2019  PHQ - 2 Score 0 0 0 0  PHQ- 9 Score 0 - 0 0   Depression screen [facility] 2/9 01/20/2021 09/30/2020 12/30/2019 11/13/2019 04/11/2019  Decreased Interest 0 0 0 0 0  Down, Depressed, Hopeless 0  0 0 0 0  PHQ - 2 Score 0 0 0 0 0  Altered sleeping 0 - 0 0 0  Tired, decreased energy 0 - 0 0 0  Change in appetite 0 - 0 0 0  Feeling bad or failure about yourself  0 - 0 0 0  Trouble concentrating 0 - 0 0 0  Moving slowly or fidgety/restless 0 - 0 0 0  Suicidal thoughts 0 - 0 0 0  PHQ-9 Score 0 - 0 0 0  Difficult doing work/chores Not difficult at all - Not difficult at all Not difficult at all Not difficult at all    Alcohol screening: [place] Office Visit from [facility]/20/2021 [facility]  AUDIT-C Score 0      Immunizations and Health Maintenance: Health Maintenance  Topic Date Due  . Hepatitis C Screening  Never done  . MAMMOGRAM   Never done  . COVID-19 Vaccine (3 - Booster for Moderna series) 09/13/2020  . PAP SMEAR-Modifier  12/29/2022  . TETANUS/TDAP  06/27/2027  . COLONOSCOPY (Pts 45-27yr Insurance coverage will need to be confirmed)  03/06/2030  . INFLUENZA VACCINE  Completed  . HIV Screening  Completed     Hep C Screening:  due  STD testing and prevention (HIV/chl/gon/syphilis):  see above, no additional testing desired by pt today -   Intimate partner violence:safe  Sexual History/Pain during Intercourse: Married  Menstrual History/LMP/Abnormal Bleeding:  Patient's last menstrual period was 11/14/2015 (approximate).  Incontinence Symptoms:  none  Breast cancer:  due Last Mammogram: *see HM list above BRCA gene screening: none known  Cervical cancer screening: utd Pt denies family hx of cancers - breast, ovarian, uterine, colon:     Osteoporosis:   Discussion on osteoporosis per age, including high calcium and vitamin D supplementation, weight bearing exercises Pt is supplementing with daily calcium/Vit D. No past bone scan/dexa Roughly experienced menopause at age last 89to 7 years  Skin cancer:  Hx of skin CA -  NO Discussed atypical lesions   Colorectal cancer:   Colonoscopy is UTD - 10 yr Discussed concerning signs and sx of CRC, pt denies melena, hematochezia  Lung cancer:   Low Dose CT Chest recommended if Age 54-80years, 30 pack-year currently smoking OR have quit w/in 15years. Patient does not qualify.    Social History   Tobacco Use  . Smoking status: Never Smoker  . Smokeless tobacco: Never Used  Vaping Use  . Vaping Use: Never used  Substance Use Topics  . Alcohol use: No  . Drug use: No     Flowsheet Row [facility] 0      Family History  Problem Relation Age of Onset  . Diabetes Mother   . Hypertension Mother   . Hyperlipidemia Mother   . Stroke Father   . Hypertension Father   .  Hyperlipidemia Father   . Cancer Paternal Uncle        bowel  . Diabetes Maternal Grandmother   . Heart disease Maternal Grandmother   . Congestive Heart Failure Maternal Grandmother   . Cancer Maternal Grandfather        lung  . Stroke Paternal Grandfather   . Emphysema Paternal Grandmother   . COPD Neg Hx      Blood pressure/Hypertension: BP Readings from Last 3 Encounters:  01/20/21 126/78  09/30/20 120/82  03/06/20 (!) 139/92    Weight/Obesity: Wt Readings from  Last 10 Encounters:  01/20/21 175 lb 9.6 oz (79.7 kg)  09/30/20 177 lb 3.2 oz (80.4 kg)  03/06/20 170 lb (77.1 kg)  01/20/20 169 lb 2 oz (76.7 kg)  12/30/19 169 lb 11.2 oz (77 kg)  11/13/19 168 lb 1.6 oz (76.2 kg)  12/26/18 169 lb 1.6 oz (76.7 kg)  11/28/18 173 lb 11.2 oz (78.8 kg)  12/01/17 169 lb 6.4 oz (76.8 kg)  03/27/17 169 lb 3 oz (76.7 kg)   BMI Readings from Last 3 Encounters:  01/20/21 30.14 kg/m  09/30/20 30.42 kg/m  03/06/20 30.11 kg/m     Lipids:  Lab Results  Component Value Date   CHOL 264 (H) 12/30/2019   CHOL 239 (H) 12/26/2018   CHOL 249 (H) 11/28/2018   Lab Results  Component Value Date   HDL 60 12/30/2019   HDL 53 12/26/2018   HDL 60 11/28/2018   Lab Results  Component Value Date   LDLCALC 178 (H) 12/30/2019   LDLCALC 154 (H) 12/26/2018   LDLCALC 161 (H) 11/28/2018   Lab Results  Component Value Date   TRIG 125 12/30/2019   TRIG 185 (H) 12/26/2018   TRIG 152 (H) 11/28/2018   Lab Results  Component Value Date   CHOLHDL 4.4 12/30/2019   CHOLHDL 4.5 12/26/2018   CHOLHDL 4.2 11/28/2018   No results found for: LDLDIRECT Based on the results of lipid panel his/her cardiovascular risk factor ( using [facility] )  in the next 10 years is: The 10-year ASCVD risk score Mikey Bussing DC Brooke Bonito., et al., 2013) is: 2%   Values used to calculate the score:     Age: 62 years     Sex: Female     Is Non-Hispanic African American: No     Diabetic: No     Tobacco smoker: No      Systolic Blood Pressure: 948 mmHg     Is BP treated: No     HDL Cholesterol: 60 mg/dL     Total Cholesterol: 264 mg/dL Glucose:  Glucose, Bld  Date Value Ref Range Status  12/30/2019 90 65 - 99 mg/dL Final    Comment:    .            Fasting reference interval .   12/26/2018 93 65 - 99 mg/dL Final    Comment:    .            Fasting reference interval .   11/28/2018 105 (H) 65 - 99 mg/dL Final    Comment:    .            Fasting reference interval . For someone without known diabetes, a glucose value between 100 and 125 mg/dL is consistent with prediabetes and should be confirmed with a follow-up test. .     Advanced Care Planning:  A voluntary discussion about advance care planning including the explanation and discussion of advance directives.   Discussed health care proxy and Living will, and the patient was able to identify a health care proxy as her husband Fulton Reek.   Patient does not have a living will at present time.  Discussed ACP and AD at length today - pt given packet and encouraged to review and discuss with her spouse and return if completed  Social History      She        Social History   Socioeconomic History  . Marital status: Married    Spouse name: Fulton Reek  . Number  of children: 1  . Years of education: 18  . Highest education level: Some college, no degree  Occupational History  . Not on file  Tobacco Use  . Smoking status: Never Smoker  . Smokeless tobacco: Never Used  Vaping Use  . Vaping Use: Never used  Substance and Sexual Activity  . Alcohol use: No  . Drug use: No  . Sexual activity: Not on file  Other Topics Concern  . Not on file  Social History Narrative  . Not on file   Social Determinants of Health   Financial Resource Strain: Low Risk   . Difficulty of Paying Living Expenses: Not hard at all  Food Insecurity: No Food Insecurity  . Worried About Charity fundraiser in the Last Year: Never true  . Ran Out of Food in the  Last Year: Never true  Transportation Needs: No Transportation Needs  . Lack of Transportation (Medical): No  . Lack of Transportation (Non-Medical): No  Physical Activity: Insufficiently Active  . Days of Exercise per Week: 2 days  . Minutes of Exercise per Session: 20 min  Stress: No Stress Concern Present  . Feeling of Stress : Not at all  Social Connections: Moderately Isolated  . Frequency of Communication with Friends and Family: More than three times a week  . Frequency of Social Gatherings with Friends and Family: Twice a week  . Attends Religious Services: Never  . Active Member of Clubs or Organizations: No  . Attends Archivist Meetings: Never  . Marital Status: Married    Family History        Family History  Problem Relation Age of Onset  . Diabetes Mother   . Hypertension Mother   . Hyperlipidemia Mother   . Stroke Father   . Hypertension Father   . Hyperlipidemia Father   . Cancer Paternal Uncle        bowel  . Diabetes Maternal Grandmother   . Heart disease Maternal Grandmother   . Congestive Heart Failure Maternal Grandmother   . Cancer Maternal Grandfather        lung  . Stroke Paternal Grandfather   . Emphysema Paternal Grandmother   . COPD Neg Hx     Patient Active Problem List   Diagnosis Date Noted  . Preventative health care 03/27/2017  . Breast cancer screening 03/23/2016  . Cervical cancer screening 03/23/2016  . Screening-pulmonary TB 12/09/2015  . Seasonal allergic rhinitis 05/26/2015  . Mixed hyperlipidemia 05/26/2015  . Anxiety state 05/26/2015  . Menopausal state 05/26/2015  . Overweight (BMI 25.0-29.9) 05/26/2015  . 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 09/12/2013    Past Surgical History:  Procedure Laterality Date  . COLONOSCOPY WITH PROPOFOL N/A 03/06/2020   Procedure: COLONOSCOPY WITH PROPOFOL;  Surgeon: Jonathon Bellows, MD;  Location: [facility] ENDOSCOPY;  Service: Gastroenterology;  Laterality: N/A;  .  ESOPHAGOGASTRODUODENOSCOPY (EGD) WITH PROPOFOL N/A 03/06/2020   Procedure: ESOPHAGOGASTRODUODENOSCOPY (EGD) WITH PROPOFOL;  Surgeon: Jonathon Bellows, MD;  Location[facility] ENDOSCOPY;  Service: Gastroenterology;  Laterality: N/A;     Current Outpatient Medications:  .  Calcium Carbonate-Vit D-Min (CALCIUM 1200 PO), Take by mouth., Disp: , Rfl:  .  cholecalciferol (VITAMIN D) 1000 units tablet, Take 1,000 Units by mouth daily., Disp: , Rfl:  .  fluticasone (FLONASE) 50 MCG/ACT nasal spray, Place into both nostrils., Disp: , Rfl:  .  loratadine (CLARITIN) 10 MG tablet, Take 10 mg by mouth daily., Disp: , Rfl:  .  pantoprazole (PROTONIX) 40 MG tablet, Take 1 tablet by mouth once daily, Disp: 90 tablet, Rfl: 3 .  Venlafaxine HCl 37.5 MG TB24, Take 1 tablet (37.5 mg total) by mouth daily., Disp: 90 tablet, Rfl: 3  No Known Allergies  Patient Care Team: Delsa Grana, PA-C as PCP - General (Family Medicine) Lada, Satira Anis, MD as PCP - Family Medicine (Family Medicine) Oh, Lupita Dawn, MD (Inactive) as Consulting Physician (Gastroenterology)  Review of Systems  Constitutional: Negative.  Negative for activity change, appetite change, fatigue and unexpected weight change.  HENT: Negative.   Eyes: Negative.   Respiratory: Negative.  Negative for shortness of breath.   Cardiovascular: Negative.  Negative for chest pain, palpitations and leg swelling.  Gastrointestinal: Negative.  Negative for abdominal pain and blood in stool.  Endocrine: Negative.   Genitourinary: Negative.   Musculoskeletal: Negative.  Negative for arthralgias, gait problem, joint swelling and myalgias.  Skin: Negative.  Negative for pallor and rash.  Allergic/Immunologic: Negative.   Neurological: Negative.  Negative for syncope and weakness.  Hematological: Negative.   Psychiatric/Behavioral: Negative.  Negative for dysphoric mood, self-injury and suicidal ideas. The patient is not nervous/anxious.   All other systems reviewed and  are negative.    I personally reviewed active problem list, medication list, allergies, family history, social history, health maintenance, notes from last encounter, lab results, imaging with the patient/caregiver today.        Objective:   Vitals:  Vitals:   01/20/21 0908  BP: 126/78  Pulse: 84  Resp: 16  Temp: 98.5 F (36.9 C)  SpO2: 97%  Weight: 175 lb 9.6 oz (79.7 kg)  Height: 5' 4"  (1.626 m)    Body mass index is 30.14 kg/m.  Physical Exam Vitals and nursing note reviewed.  Constitutional:      General: She is not in acute distress.    Appearance: Normal appearance. She is well-developed, well-groomed and overweight. She is not ill-appearing, toxic-appearing or diaphoretic.     Interventions: Face mask in place.  HENT:     Head: Normocephalic and atraumatic.     Right Ear: External ear normal.     Left Ear: External ear normal.  Eyes:     General: Lids are normal. No scleral icterus.       Right eye: No discharge.        Left eye: No discharge.     Conjunctiva/sclera: Conjunctivae normal.  Neck:     Trachea: Phonation normal. No tracheal deviation.  Cardiovascular:     Rate and Rhythm: Normal rate and regular rhythm.     Pulses: Normal pulses.          Radial pulses are 2+ on the right side and 2+ on the left side.       Posterior tibial pulses are 2+ on the right side and 2+ on the left side.     Heart sounds: Normal heart sounds. No murmur heard. No friction rub. No gallop.   Pulmonary:     Effort: Pulmonary effort is normal. No respiratory distress.     Breath sounds: Normal breath sounds. No stridor. No wheezing, rhonchi or rales.  Chest:     Chest wall: No tenderness.  Abdominal:     General: Bowel sounds are normal. There is no distension.     Palpations: Abdomen is soft.  Musculoskeletal:     Cervical back: Normal range of motion.     Right lower leg: No edema.     Left  lower leg: No edema.  Lymphadenopathy:     Cervical: No cervical  adenopathy.  Skin:    General: Skin is warm and dry.     Capillary Refill: Capillary refill takes less than 2 seconds.     Coloration: Skin is not jaundiced or pale.     Findings: No bruising or rash.  Neurological:     Mental Status: She is alert. Mental status is at baseline.     Motor: No abnormal muscle tone.     Gait: Gait normal.  Psychiatric:        Mood and Affect: Mood normal.        Speech: Speech normal.        Behavior: Behavior normal. Behavior is cooperative.       Fall Risk: Fall Risk  01/20/2021 09/30/2020 12/30/2019 11/13/2019 04/11/2019  Falls in the past year? 0 0 0 0 0  Number falls in past yr: 0 0 0 0 0  Injury with Fall? 0 0 0 0 0  Follow up - - - - Falls evaluation completed    Functional Status Survey: Is the patient deaf or have difficulty hearing?: No Does the patient have difficulty seeing, even when wearing glasses/contacts?: No Does the patient have difficulty concentrating, remembering, or making decisions?: No Does the patient have difficulty walking or climbing stairs?: No Does the patient have difficulty dressing or bathing?: No Does the patient have difficulty doing errands alone such as visiting a doctor's office or shopping?: No   Assessment & Plan:    CPE completed today  . USPSTF grade A and B recommendations reviewed with patient; age-appropriate recommendations, preventive care, screening tests, etc discussed and encouraged; healthy living encouraged; see AVS for patient education given to patient  . Discussed importance of 150 minutes of physical activity weekly, AHA exercise recommendations given to pt in AVS/handout  . Discussed importance of healthy diet:  eating lean meats and proteins, avoiding trans fats and saturated fats, avoid simple sugars and excessive carbs in diet, eat 6 servings of fruit/vegetables daily and drink plenty of water and avoid sweet beverages.    . Recommended pt to do annual eye exam and routine dental  exams/cleanings  . Depression, alcohol, fall screening completed as documented above and per flowsheets  . Reviewed Health Maintenance: Health Maintenance  Topic Date Due  . Hepatitis C Screening  Never done  . MAMMOGRAM  Never done  . COVID-19 Vaccine (3 - Booster for Moderna series) 09/13/2020  . PAP SMEAR-Modifier  12/29/2022  . TETANUS/TDAP  06/27/2027  . COLONOSCOPY (Pts 45-18yr Insurance coverage will need to be confirmed)  03/06/2030  . INFLUENZA VACCINE  Completed  . HIV Screening  Completed    . Immunizations: Immunization History  Administered Date(s) Administered  . Influenza,inj,Quad PF,6+ Mos 08/15/2019, 09/24/2020  . Moderna Sars-Covid-2 Vaccination 02/08/2020, 03/14/2020  . PPD Test 06/26/2017  . Tdap 07/12/2010, 06/26/2017     ICD-10-CM   1. Annual physical exam  Z00.00 CBC w/Diff/Platelet    Lipid panel    COMPLETE METABOLIC PANEL WITH GFR    Hemoglobin A1c  2. Encounter for hepatitis C screening test for low risk patient  Z11.59 Hepatitis C Antibody  3. Encounter for screening mammogram for malignant neoplasm of breast  Z12.31 MM 3D SCREEN BREAST BILATERAL  4. Mixed hyperlipidemia  E78.2 Lipid panel    COMPLETE METABOLIC PANEL WITH GFR   familial - not on meds, working on diet, recheck labs, long discussion today on lipids,  risk, family hx risk, statins  5. Family history of diabetes mellitus in mother  Z78.3 Hemoglobin A1c   Very concerned about developing diabetes, no A1c done in the past  6. Family history of stroke or transient ischemic attack in father  Z69.3    first stroke at 11 y/o   39. Need for shingles vaccine  Z23 Zoster Vaccine Adjuvanted [facility]) injection   sent to pharmacy to get completed shingrix series  8. 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 Gastroesophageal reflux disease, unspecified whether esophagitis present  K21.9    On daily PPI with some breakthrough symptoms discussed diet efforts and other over-the-counter medications it may help she follows with GI   Handout and info given re: statins, recommendations and indications to start statin -I did tell the patient that if her LDL remains above 160 I would really like her to start a statin.  With her recent labs and with her family history I did explain that if she tolerates a statin well it would reduce her cholesterol and reduce her risk of atherosclerotic events like heart attack or stroke and would be very beneficial for her with her family and personal medical history  Anticipate prescribing her a statin and would like her to follow-up in 4 months to monitor lipids, CMP and how she tolerates meds  Delsa Grana, PA-C 01/20/21 9:37 AM  [place]

## 2021-01-21 LAB — CBC WITH DIFFERENTIAL/PLATELET
Absolute Monocytes: 653 cells/uL (ref 200–950)
Basophils Absolute: 28 cells/uL (ref 0–200)
Basophils Relative: 0.4 %
Eosinophils Absolute: 121 cells/uL (ref 15–500)
Eosinophils Relative: 1.7 %
HCT: 37.2 % (ref 35.0–45.0)
Hemoglobin: 12.7 g/dL (ref 11.7–15.5)
Lymphs Abs: 2400 cells/uL (ref 850–3900)
MCH: 32.2 pg (ref 27.0–33.0)
MCHC: 34.1 g/dL (ref 32.0–36.0)
MCV: 94.2 fL (ref 80.0–100.0)
MPV: 11.1 fL (ref 7.5–12.5)
Monocytes Relative: 9.2 %
Neutro Abs: 3898 cells/uL (ref 1500–7800)
Neutrophils Relative %: 54.9 %
Platelets: 330 10*3/uL (ref 140–400)
RBC: 3.95 10*6/uL (ref 3.80–5.10)
RDW: 12.8 % (ref 11.0–15.0)
Total Lymphocyte: 33.8 %
WBC: 7.1 10*3/uL (ref 3.8–10.8)

## 2021-01-21 LAB — COMPLETE METABOLIC PANEL WITH GFR
AG Ratio: 1.5 (calc) (ref 1.0–2.5)
ALT: 16 U/L (ref 6–29)
AST: 17 U/L (ref 10–35)
Albumin: 4.4 g/dL (ref 3.6–5.1)
Alkaline phosphatase (APISO): 47 U/L (ref 37–153)
BUN: 18 mg/dL (ref 7–25)
CO2: 30 mmol/L (ref 20–32)
Calcium: 9.9 mg/dL (ref 8.6–10.4)
Chloride: 103 mmol/L (ref 98–110)
Creat: 0.86 mg/dL (ref 0.50–1.05)
GFR, Est African American: 89 mL/min/{1.73_m2} (ref >=60)
GFR, Est Non African American: 77 mL/min/{1.73_m2} (ref >=60)
Globulin: 2.9 g/dL (calc) (ref 1.9–3.7)
Glucose, Bld: 105 mg/dL — ABNORMAL HIGH (ref 65–99)
Potassium: 4.5 mmol/L (ref 3.5–5.3)
Sodium: 142 mmol/L (ref 135–146)
Total Bilirubin: 0.6 mg/dL (ref 0.2–1.2)
Total Protein: 7.3 g/dL (ref 6.1–8.1)

## 2021-01-21 LAB — LIPID PANEL
Cholesterol: 263 mg/dL — ABNORMAL HIGH (ref <200)
HDL: 61 mg/dL (ref >=50)
LDL Cholesterol (Calc): 178 mg/dL (calc) — ABNORMAL HIGH
Non-HDL Cholesterol (Calc): 202 mg/dL (calc) — ABNORMAL HIGH (ref <130)
Total CHOL/HDL Ratio: 4.3 (calc) (ref <5.0)
Triglycerides: 114 mg/dL (ref <150)

## 2021-01-21 LAB — HEMOGLOBIN A1C
Hgb A1c MFr Bld: 5.7 % of total Hgb — ABNORMAL HIGH (ref <5.7)
Mean Plasma Glucose: 117 mg/dL
eAG (mmol/L): 6.5 mmol/L

## 2021-01-21 LAB — HEPATITIS C ANTIBODY
Hepatitis C Ab: NONREACTIVE
SIGNAL TO CUT-OFF: 0.01 (ref <1.00)

## 2021-01-26 ENCOUNTER — Telehealth: Payer: Self-pay

## 2021-01-26 MED ORDER — ROSUVASTATIN CALCIUM 5 MG PO TABS: 5.0000 mg | ORAL_TABLET | Freq: Every day | ORAL | 1 refills | Status: DC

## 2021-01-26 NOTE — Telephone Encounter (Signed)
-----   Message from Stephanie Koch, [place] sent at 01/25/2021  5:30 PM EST ----- Pt labs show prediabetes and very high cholesterol Recommend starting statin - please send in crestor 5 mg to take one daily at bedtime #90 with 1 refill and pt should get f/up OV in 3-4 months   If she wants to discuss labs please advise f/up appt to [place]

## 2021-02-01 ENCOUNTER — Ambulatory Visit: Payer: Self-pay

## 2021-02-01 NOTE — Telephone Encounter (Signed)
   LT    1        Alexius A. Gianfrancesco Female, 55 y.o., 02-19-1966  MRN:  [id number] Phone:  [phone] (M) ...       PCP:  Danelle Berry, PA-C Primary Cvg:  [employer]  Next Appt With Internal Medicine 03/30/2021 at 9:00 AM         Message from [facility] A Pawlus sent at 02/01/2021 3:41 PM EST  Summary: medication advice   PT was prescribed to take her medication in the PM but wanted to know if she could switch and take it in the AM since she takes her other meds at this time and has been forgetting to take them at night.          Call History   Type Contact Phone/Fax User  02/01/2021 03:29 PM EST Phone (Incoming) Diella, [place] A (Self) [phone] (H) Pawlus, Monica A   Pt. Reports she cannot remember to take her Crestor at night. Wants to know if she can take in the morning. Please advise pt. Ok to leave detailed message on voice mail.

## 2021-02-02 NOTE — Telephone Encounter (Signed)
Left detailed vm °

## 2021-02-17 ENCOUNTER — Telehealth: Payer: Self-pay

## 2021-02-17 NOTE — Telephone Encounter (Signed)
Just to clarify we can not write her a note for this without seeing her?

## 2021-02-17 NOTE — Telephone Encounter (Signed)
Copied from CRM [id number]. Topic: General - Other >> Feb 17, 2021  3:04 PM Pawlus, Maxine Glenn A wrote: Reason for CRM: Pt called back to clarify that she needs a doctors note for her acid reflux and allergies. Her cough is caused by this, not COVID. She has taken COVID tests and is negative. She just needs a doctors note stating she has these conditions.

## 2021-02-18 NOTE — Telephone Encounter (Signed)
Left message for patient

## 2021-03-29 NOTE — Progress Notes (Signed)
Name: Stephanie Koch   MRN: [id number]    DOB: [birth year]   Date:03/30/2021       Progress Note  Subjective  Chief Complaint  Follow Up  HPI  Hyperlipidemia: she was seen by her pcp on 01/20/2021 and LDL was high at 178, she also has family history of cardiovascular disease. Her father was 67 when he had his first stroke. Maternal grandmother had heart disease. Mother also has high cholesterol.   Pre-diabetes: mother has DM, her last A1C was 5.7% , discussed low carb diet and importance of regular physical activity. Denies polyphagia, she has noticed some polydipsia lately, denies polyuria.   Seasonal allergic rhinitis: she states symptoms present during Spring, she has a dry cough, post-nasal drainage, intermittent nasal congestion but no rhinorrhea.   Patient Active Problem List   Diagnosis Date Noted  . Seasonal allergic rhinitis 05/26/2015  . Mixed hyperlipidemia 05/26/2015  . Anxiety state 05/26/2015  . Menopausal state 05/26/2015  . Overweight (BMI 25.0-29.9) 05/26/2015  . 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 09/12/2013    Past Surgical History:  Procedure Laterality Date  . COLONOSCOPY WITH PROPOFOL N/A 03/06/2020   Procedure: COLONOSCOPY WITH PROPOFOL;  Surgeon: [name], MD;  Location: [facility] ENDOSCOPY;  Service: Gastroenterology;  Laterality: N/A;  . ESOPHAGOGASTRODUODENOSCOPY (EGD) WITH PROPOFOL N/A 03/06/2020   Procedure: ESOPHAGOGASTRODUODENOSCOPY (EGD) WITH PROPOFOL;  Surgeon: [name], MD;  Location: [facility] ENDOSCOPY;  Service: Gastroenterology;  Laterality: N/A;    Family History  Problem Relation Age of Onset  . Diabetes Mother   . Hypertension Mother   . Hyperlipidemia Mother   . Stroke Father   . Hypertension Father   . Hyperlipidemia Father   . Cancer Paternal Uncle        bowel  . Diabetes Maternal Grandmother   . Heart disease Maternal Grandmother   . Congestive Heart Failure Maternal Grandmother   . Cancer Maternal  Grandfather        lung  . Stroke Paternal Grandfather   . Emphysema Paternal Grandmother   . COPD Neg Hx     Social History   Tobacco Use  . Smoking status: Never Smoker  . Smokeless tobacco: Never Used  Substance Use Topics  . Alcohol use: No     Current Outpatient Medications:  .  Calcium Carbonate-Vit D-Min (CALCIUM 1200 PO), Take by mouth., Disp: , Rfl:  .  cholecalciferol (VITAMIN D) 1000 units tablet, Take 1,000 Units by mouth daily., Disp: , Rfl:  .  fluticasone (FLONASE) 50 MCG/ACT nasal spray, Place 2 sprays into both nostrils daily., Disp: 16 g, Rfl: 5 .  loratadine (CLARITIN) 10 MG tablet, Take 10 mg by mouth daily., Disp: , Rfl:  .  pantoprazole (PROTONIX) 40 MG tablet, Take 1 tablet by mouth once daily, Disp: 90 tablet, Rfl: 3 .  rosuvastatin (CRESTOR) 5 MG tablet, Take 1 tablet (5 mg total) by mouth daily., Disp: 90 tablet, Rfl: 1 .  Venlafaxine HCl 37.5 MG TB24, Take 1 tablet (37.5 mg total) by mouth daily., Disp: 90 tablet, Rfl: 3  No Known Allergies  I personally reviewed active problem list, medication list, allergies, family history, social history, health maintenance with the patient/caregiver today.   ROS  Constitutional: Negative for fever or weight change.  Respiratory: Negative for cough and shortness of breath.   Cardiovascular: Negative for chest pain or palpitations.  Gastrointestinal: Negative for abdominal pain, no bowel changes.  Musculoskeletal: Negative for gait problem or joint swelling.  Skin: Negative for rash.  Neurological: Negative for dizziness or headache.  No other specific complaints in a complete review of systems (except as listed in HPI above).  Objective  Vitals:   03/30/21 0844  BP: 122/84  Pulse: 71  Resp: 16  Temp: 98.4 F (36.9 C)  TempSrc: Oral  SpO2: 99%  Weight: 175 lb (79.4 kg)  Height: 5\' 4"  (1.626 m)    Body mass index is 30.04 kg/m.  Physical Exam  Constitutional: Patient appears well-developed and  well-nourished. Obese No distress.  HEENT: head atraumatic, normocephalic, pupils equal and reactive to light,  neck supple Cardiovascular: Normal rate, regular rhythm and normal heart sounds.  No murmur heard. No BLE edema. Pulmonary/Chest: Effort normal and breath sounds normal. No respiratory distress. Abdominal: Soft.  There is no tenderness. Psychiatric: Patient has a normal mood and affect. behavior is normal. Judgment and thought content normal.  Recent Results (from the past 2160 hour(s))  Hepatitis C Antibody     Status: None   Collection Time: 01/20/21 10:21 AM  Result Value Ref Range   Hepatitis C Ab NON-REACTIVE NON-REACTI   SIGNAL TO CUT-OFF 0.01 <1.00    Comment: . HCV antibody was non-reactive. There is no laboratory  evidence of HCV infection. . In most cases, no further action is required. However, if recent HCV exposure is suspected, a test for HCV RNA (test code 03/20/21) is suggested. . For additional information please refer to http://education.quest[facility].com/faq/FAQ22v1 (This link is being provided for informational/ educational purposes only.) .   CBC w/Diff/Platelet     Status: None   Collection Time: 01/20/21 10:21 AM  Result Value Ref Range   WBC 7.1 3.8 - 10.8 Thousand/uL   RBC 3.95 3.80 - 5.10 Million/uL   Hemoglobin 12.7 11.7 - 15.5 g/dL   HCT 03/20/21 10.2 - 58.5 %   MCV 94.2 80.0 - 100.0 fL   MCH 32.2 27.0 - 33.0 pg   MCHC 34.1 32.0 - 36.0 g/dL   RDW 27.7 82.4 - 23.5 %   Platelets 330 140 - 400 Thousand/uL   MPV 11.1 7.5 - 12.5 fL   Neutro Abs 3,898 1,500 - 7,800 cells/uL   Lymphs Abs 2,400 850 - 3,900 cells/uL   Absolute Monocytes 653 200 - 950 cells/uL   Eosinophils Absolute 121 15 - 500 cells/uL   Basophils Absolute 28 0 - 200 cells/uL   Neutrophils Relative % 54.9 %   Total Lymphocyte 33.8 %   Monocytes Relative 9.2 %   Eosinophils Relative 1.7 %   Basophils Relative 0.4 %  Lipid panel     Status: Abnormal   Collection Time:  01/20/21 10:21 AM  Result Value Ref Range   Cholesterol 263 (H) <200 mg/dL   HDL 61 > OR = 50 mg/dL   Triglycerides 03/20/21 443 mg/dL   LDL Cholesterol (Calc) 178 (H) mg/dL (calc)    Comment: Reference range: <100 . Desirable range <100 mg/dL for primary prevention;   <70 mg/dL for patients with CHD or diabetic patients  with > or = 2 CHD risk factors. <154 LDL-C is now calculated using the Martin-Hopkins  calculation, which is a validated novel method providing  better accuracy than the Friedewald equation in the  estimation of LDL-C.  Marland Kitchen et al. Horald Pollen. Lenox Ahr): 2061-2068  (http://education.[facility].com/faq/FAQ164)    Total CHOL/HDL Ratio 4.3 <5.0 (calc)   Non-HDL Cholesterol (Calc) 202 (H) <130 mg/dL (calc)    Comment: For patients with diabetes plus 1 major ASCVD risk  factor, treating to a non-HDL-C goal of <100 mg/dL  (LDL-C of <09 mg/dL) is considered a therapeutic  option.   COMPLETE METABOLIC PANEL WITH GFR     Status: Abnormal   Collection Time: 01/20/21 10:21 AM  Result Value Ref Range   Glucose, Bld 105 (H) 65 - 99 mg/dL    Comment: .            Fasting reference interval . For someone without known diabetes, a glucose value between 100 and 125 mg/dL is consistent with prediabetes and should be confirmed with a follow-up test. .    BUN 18 7 - 25 mg/dL   Creat 7.35 3.29 - 9.24 mg/dL    Comment: For patients >42 years of age, the reference limit for Creatinine is approximately 13% higher for people identified as African-American. .    GFR, Est Non African American 77 > OR = 60 mL/min/1.78m2   GFR, Est African American 89 > OR = 60 mL/min/1.76m2   BUN/Creatinine Ratio NOT APPLICABLE 6 - 22 (calc)   Sodium 142 135 - 146 mmol/L   Potassium 4.5 3.5 - 5.3 mmol/L   Chloride 103 98 - 110 mmol/L   CO2 30 20 - 32 mmol/L   Calcium 9.9 8.6 - 10.4 mg/dL   Total Protein 7.3 6.1 - 8.1 g/dL   Albumin 4.4 3.6 - 5.1 g/dL   Globulin 2.9 1.9 - 3.7 g/dL (calc)    AG Ratio 1.5 1.0 - 2.5 (calc)   Total Bilirubin 0.6 0.2 - 1.2 mg/dL   Alkaline phosphatase (APISO) 47 37 - 153 U/L   AST 17 10 - 35 U/L   ALT 16 6 - 29 U/L  Hemoglobin A1c     Status: Abnormal   Collection Time: 01/20/21 10:21 AM  Result Value Ref Range   Hgb A1c MFr Bld 5.7 (H) <5.7 % of total Hgb    Comment: For someone without known diabetes, a hemoglobin  A1c value between 5.7% and 6.4% is consistent with prediabetes and should be confirmed with a  follow-up test. . For someone with known diabetes, a value <7% indicates that their diabetes is well controlled. A1c targets should be individualized based on duration of diabetes, age, comorbid conditions, and other considerations. . This assay result is consistent with an increased risk of diabetes. . Currently, no consensus exists regarding use of hemoglobin A1c for diagnosis of diabetes for children. .    Mean Plasma Glucose 117 mg/dL   eAG (mmol/L) 6.5 mmol/L      PHQ2/9: Depression screen [facility] 2/9 03/30/2021 01/20/2021 09/30/2020 12/30/2019 11/13/2019  Decreased Interest 0 0 0 0 0  Down, Depressed, Hopeless 0 0 0 0 0  PHQ - 2 Score 0 0 0 0 0  Altered sleeping - 0 - 0 0  Tired, decreased energy - 0 - 0 0  Change in appetite - 0 - 0 0  Feeling bad or failure about yourself  - 0 - 0 0  Trouble concentrating - 0 - 0 0  Moving slowly or fidgety/restless - 0 - 0 0  Suicidal thoughts - 0 - 0 0  PHQ-9 Score - 0 - 0 0  Difficult doing work/chores - Not difficult at all - Not difficult at all Not difficult at all    phq 9 is negative   Fall Risk: Fall Risk  03/30/2021 01/20/2021 09/30/2020 12/30/2019 11/13/2019  Falls in the past year? 0 0 0 0 0  Number falls in past yr: 0  0 0 0 0  Injury with Fall? 0 0 0 0 0  Follow up - - - - -     Functional Status Survey: Is the patient deaf or have difficulty hearing?: No Does the patient have difficulty seeing, even when wearing glasses/contacts?: No Does the patient have  difficulty concentrating, remembering, or making decisions?: No Does the patient have difficulty walking or climbing stairs?: No Does the patient have difficulty dressing or bathing?: No Does the patient have difficulty doing errands alone such as visiting a doctor's office or shopping?: No    Assessment & Plan  1. Hyperlipidemia LDL goal <130  Lipid panel and liver enzymes today, we will adjust dose if LDL not at goal   2. Pre-diabetes  Discussed life style modification with patient   3. Seasonal allergic rhinitis due to pollen  - fluticasone (FLONASE) 50 MCG/ACT nasal spray; Place 2 sprays into both nostrils daily.  Dispense: 48 g; Refill: 1 - montelukast (SINGULAIR) 10 MG tablet; Take 1 tablet (10 mg total) by mouth at bedtime.  Dispense: 90 tablet; Refill: 1

## 2021-03-30 ENCOUNTER — Encounter: Payer: Self-pay | Admitting: Family Medicine

## 2021-03-30 ENCOUNTER — Other Ambulatory Visit: Payer: Self-pay

## 2021-03-30 ENCOUNTER — Ambulatory Visit (INDEPENDENT_AMBULATORY_CARE_PROVIDER_SITE_OTHER): Payer: BC Managed Care – PPO | Admitting: Family Medicine

## 2021-03-30 VITALS — BP 122/84 | HR 71 | Temp 98.4°F | Resp 16 | Ht 64.0 in | Wt 175.0 lb

## 2021-03-30 DIAGNOSIS — J301 Allergic rhinitis due to pollen: Secondary | ICD-10-CM

## 2021-03-30 DIAGNOSIS — R7303 Prediabetes: Secondary | ICD-10-CM

## 2021-03-30 DIAGNOSIS — E785 Hyperlipidemia, unspecified: Secondary | ICD-10-CM | POA: Diagnosis not present

## 2021-03-30 MED ORDER — FLUTICASONE PROPIONATE 50 MCG/ACT NA SUSP
2.0000 | Freq: Every day | NASAL | 1 refills | Status: DC
Start: 2021-03-30 — End: 2022-02-28

## 2021-03-30 MED ORDER — MONTELUKAST SODIUM 10 MG PO TABS: 10.0000 mg | ORAL_TABLET | Freq: Every day | ORAL | 1 refills | Status: DC

## 2021-03-31 LAB — HEPATIC FUNCTION PANEL
AG Ratio: 1.7 (calc) (ref 1.0–2.5)
ALT: 18 U/L (ref 6–29)
AST: 20 U/L (ref 10–35)
Albumin: 4.3 g/dL (ref 3.6–5.1)
Alkaline phosphatase (APISO): 42 U/L (ref 37–153)
Bilirubin, Direct: 0.1 mg/dL (ref 0.0–0.2)
Globulin: 2.5 g/dL (calc) (ref 1.9–3.7)
Indirect Bilirubin: 0.7 mg/dL (calc) (ref 0.2–1.2)
Total Bilirubin: 0.8 mg/dL (ref 0.2–1.2)
Total Protein: 6.8 g/dL (ref 6.1–8.1)

## 2021-03-31 LAB — LIPID PANEL
Cholesterol: 168 mg/dL (ref <200)
HDL: 61 mg/dL (ref >=50)
LDL Cholesterol (Calc): 86 mg/dL (calc)
Non-HDL Cholesterol (Calc): 107 mg/dL (calc) (ref <130)
Total CHOL/HDL Ratio: 2.8 (calc) (ref <5.0)
Triglycerides: 110 mg/dL (ref <150)

## 2021-04-07 ENCOUNTER — Telehealth: Payer: BC Managed Care – PPO | Admitting: Nurse Practitioner

## 2021-04-07 DIAGNOSIS — R399 Unspecified symptoms and signs involving the genitourinary system: Secondary | ICD-10-CM

## 2021-04-07 MED ORDER — CEPHALEXIN 500 MG PO CAPS: 500.0000 mg | ORAL_CAPSULE | Freq: Two times a day (BID) | ORAL | 0 refills | Status: DC

## 2021-04-07 NOTE — Progress Notes (Signed)

## 2021-04-22 ENCOUNTER — Telehealth: Payer: BC Managed Care – PPO | Admitting: Physician Assistant

## 2021-04-22 DIAGNOSIS — R300 Dysuria: Secondary | ICD-10-CM

## 2021-04-22 MED ORDER — NITROFURANTOIN MONOHYD MACRO 100 MG PO CAPS
100.0000 mg | ORAL_CAPSULE | Freq: Two times a day (BID) | ORAL | 0 refills | Status: DC
Start: 2021-04-22 — End: 2022-03-23

## 2021-04-22 NOTE — Progress Notes (Signed)
I have spent 5 minutes in review of e-visit questionnaire, review and updating patient chart, medical decision making and response to patient.   Currie Dennin Cody Verdene Creson, PA-C    

## 2021-04-22 NOTE — Progress Notes (Signed)
We are sorry that you are not feeling well.  Here is how we plan to help!  Based on what you shared with me it looks like you most likely have a simple urinary tract infection.  A UTI (Urinary Tract Infection) is a bacterial infection of the bladder.  Most cases of urinary tract infections are simple to treat but a key part of your care is to encourage you to drink plenty of fluids and watch your symptoms carefully.  I have prescribed MacroBid 100 mg twice a day for 5 days.  Your symptoms should gradually improve. Call us if the burning in your urine worsens, you develop worsening fever, back pain or pelvic pain or if your symptoms do not resolve after completing the antibiotic. Giving you have had two episodes of this within the past 4-5 weeks, I recommend that you schedule a follow-up with your primary care provider if there is any further recurrence and you will need testing of your urine so it can be sent for culture.   Urinary tract infections can be prevented by drinking plenty of water to keep your body hydrated.  Also be sure when you wipe, wipe from front to back and don't hold it in!  If possible, empty your bladder every 4 hours.  Your e-visit answers were reviewed by a board certified advanced clinical practitioner to complete your personal care plan.  Depending on the condition, your plan could have included both over the counter or prescription medications.  If there is a problem please reply  once you have received a response from your provider.  Your safety is important to [place].  If you have drug allergies check your prescription carefully.    You can use MyChart to ask questions about today's visit, request a non-urgent call back, or ask for a work or school excuse for 24 hours related to this e-Visit. If it has been greater than 24 hours you will need to follow up with your provider, or enter a new e-Visit to address those concerns.   You will get an e-mail in the next two days  asking about your experience.  I hope that your e-visit has been valuable and will speed your recovery. Thank you for using e-visits.

## 2021-07-03 ENCOUNTER — Other Ambulatory Visit: Payer: Self-pay | Admitting: Family Medicine

## 2021-07-03 DIAGNOSIS — K219 Gastro-esophageal reflux disease without esophagitis: Secondary | ICD-10-CM

## 2021-07-18 ENCOUNTER — Other Ambulatory Visit: Payer: Self-pay | Admitting: Family Medicine

## 2021-09-24 NOTE — Telephone Encounter (Signed)
Pt called stating that she cannot make it to this appt and is requesting to have it cancelled. She states that she will call back to reschedule. Please advise.

## 2021-09-30 ENCOUNTER — Ambulatory Visit: Payer: BC Managed Care – PPO | Admitting: Nurse Practitioner

## 2021-09-30 DIAGNOSIS — K219 Gastro-esophageal reflux disease without esophagitis: Secondary | ICD-10-CM

## 2021-09-30 DIAGNOSIS — R7303 Prediabetes: Secondary | ICD-10-CM

## 2021-09-30 DIAGNOSIS — E785 Hyperlipidemia, unspecified: Secondary | ICD-10-CM

## 2021-10-07 ENCOUNTER — Other Ambulatory Visit: Payer: Self-pay | Admitting: Family Medicine

## 2021-10-07 DIAGNOSIS — K219 Gastro-esophageal reflux disease without esophagitis: Secondary | ICD-10-CM

## 2021-10-07 NOTE — Telephone Encounter (Signed)
Requested Prescriptions  Pending Prescriptions Disp Refills  . pantoprazole (PROTONIX) 40 MG tablet [Pharmacy Med Name: Pantoprazole Sodium 40 MG Oral Tablet Delayed Release] 90 tablet 0    Sig: Take 1 tablet by mouth once daily     Gastroenterology: Proton Pump Inhibitors Passed - 10/07/2021  5:58 AM      Passed - Valid encounter within last 12 months    Recent Outpatient Visits          6 months ago Hyperlipidemia LDL goal <130   [facility] Alba Cory, MD   8 months ago Annual physical exam   [facility] Danelle Berry, PA-C   1 year ago Encounter for completion of form with patient   [facility] Danelle Berry, PA-C   1 year ago Well woman exam with routine gynecological exam   [facility] Danelle Berry, PA-C   1 year ago Acute right hip pain   [facility] Danelle Berry, PA-C      Future Appointments            In 3 months Danelle Berry, PA-C [facility], [facility]

## 2022-01-02 ENCOUNTER — Other Ambulatory Visit: Payer: Self-pay | Admitting: Family Medicine

## 2022-01-02 DIAGNOSIS — K219 Gastro-esophageal reflux disease without esophagitis: Secondary | ICD-10-CM

## 2022-01-02 NOTE — Telephone Encounter (Signed)
Requested Prescriptions  Pending Prescriptions Disp Refills   pantoprazole (PROTONIX) 40 MG tablet [Pharmacy Med Name: Pantoprazole Sodium 40 MG Oral Tablet Delayed Release] 90 tablet 0    Sig: Take 1 tablet by mouth once daily     Gastroenterology: Proton Pump Inhibitors Passed - 01/02/2022 10:27 AM      Passed - Valid encounter within last 12 months    Recent Outpatient Visits          9 months ago Hyperlipidemia LDL goal <130   [facility] Alba Cory, MD   11 months ago Annual physical exam   [facility] Danelle Berry, PA-C   1 year ago Encounter for completion of form with patient   [facility] Danelle Berry, PA-C   2 years ago Well woman exam with routine gynecological exam   [facility] Danelle Berry, PA-C   2 years ago Acute right hip pain   [facility] Danelle Berry, PA-C      Future Appointments            In 2 weeks Margarita Mail, DO [facility], [facility]

## 2022-01-21 ENCOUNTER — Encounter: Payer: BC Managed Care – PPO | Admitting: Internal Medicine

## 2022-01-23 ENCOUNTER — Other Ambulatory Visit: Payer: Self-pay | Admitting: Family Medicine

## 2022-01-23 DIAGNOSIS — F411 Generalized anxiety disorder: Secondary | ICD-10-CM

## 2022-01-24 NOTE — Telephone Encounter (Signed)
Pt given 30 day supply she needs f/u

## 2022-02-25 ENCOUNTER — Other Ambulatory Visit: Payer: Self-pay | Admitting: Family Medicine

## 2022-02-25 DIAGNOSIS — F411 Generalized anxiety disorder: Secondary | ICD-10-CM

## 2022-02-25 DIAGNOSIS — J301 Allergic rhinitis due to pollen: Secondary | ICD-10-CM

## 2022-02-25 NOTE — Telephone Encounter (Signed)
Requested medication (s) are due for refill today: yes ? ?Requested medication (s) are on the active medication list: yes ? ?Last refill:  01/24/22 #30 with 0 RF ? ?Future visit scheduled: yes, 03/23/22 ? ?Notes to clinic:  Failed protocol of labs within, 12 months, (Creat 01/20/2021)  has upcoming appt, please assess. ? ? ? ?  ? ?Requested Prescriptions  ?Pending Prescriptions Disp Refills  ? Venlafaxine HCl 37.5 MG TB24 [Pharmacy Med Name: Venlafaxine HCl ER 37.5 MG Oral Tablet Extended Release 24 Hour] 30 tablet 0  ?  Sig: Take 1 tablet by mouth once daily  ?  ? Psychiatry: Antidepressants - SNRI - desvenlafaxine & venlafaxine Failed - 02/25/2022  4:25 PM  ?  ?  Failed - Cr in normal range and within 360 days  ?  Creat  ?Date Value Ref Range Status  ?01/20/2021 0.86 0.50 - 1.05 mg/dL Final  ?  Comment:  ?  For patients >66 years of age, the reference limit ?for Creatinine is approximately 13% higher for people ?identified as African-American. ?. ?  ?  ?  ?  ?  Failed - Valid encounter within last 6 months  ?  Recent Outpatient Visits   ? ?      ? 11 months ago Hyperlipidemia LDL goal <130  ? [facility] Alba Cory, MD  ? 1 year ago Annual physical exam  ? [facility] Danelle Berry, PA-C  ? 1 year ago Encounter for completion of form with patient  ? [facility] Danelle Berry, PA-C  ? 2 years ago Well woman exam with routine gynecological exam  ? [facility] Danelle Berry, PA-C  ? 2 years ago Acute right hip pain  ? [facility] Danelle Berry, [place]  ? ?  ?  ?Future Appointments   ? ?        ? In 3 weeks Margarita Mail, DO [facility], [facility]  ? ?  ? ?  ?  ?  Failed - Lipid Panel in normal range within the last 12 months  ?  Cholesterol, Total  ?Date Value Ref Range Status  ?03/23/2016 260 (H) 100 - 199 mg/dL Final  ? ?Cholesterol  ?Date Value Ref Range Status  ?03/30/2021 168 <200 mg/dL Final  ? ?LDL  Cholesterol (Calc)  ?Date Value Ref Range Status  ?03/30/2021 86 mg/dL (calc) Final  ?  Comment:  ?  Reference range: <100 ?Marland Kitchen ?Desirable range <100 mg/dL for primary prevention;   ?<70 mg/dL for patients with CHD or diabetic patients  ?with > or = 2 CHD risk factors. ?. ?LDL-C is now calculated using the Martin-Hopkins  ?calculation, which is a validated novel method providing  ?better accuracy than the Friedewald equation in the  ?estimation of LDL-C.  ?Horald Pollen et al. Lenox Ahr. 8099;833(82): 2061-2068  ?(http://education.[facility].com/faq/FAQ164) ?  ? ?HDL  ?Date Value Ref Range Status  ?03/30/2021 61 > OR = 50 mg/dL Final  ?50/53/9767 61 >34 mg/dL Final  ? ?Triglycerides  ?Date Value Ref Range Status  ?03/30/2021 110 <150 mg/dL Final  ? ?  ?  ?  Passed - Last BP in normal range  ?  BP Readings from Last 1 Encounters:  ?03/30/21 122/84  ?  ?  ?  ?  ? ? ?

## 2022-02-25 NOTE — Telephone Encounter (Signed)
Requested medication (s) are due for refill today - yes ? ?Requested medication (s) are on the active medication list -yes ? ?Future visit scheduled -yes ? ?Last refill: 03/30/21 48g 1RF ? ?Notes to clinic: Request RF: non delegated Rx ? ?Requested Prescriptions  ?Pending Prescriptions Disp Refills  ? fluticasone (FLONASE) 50 MCG/ACT nasal spray [Pharmacy Med Name: Fluticasone Propionate 50 MCG/ACT Nasal Suspension] 48 g 0  ?  Sig: Use 2 spray(s) in each nostril once daily  ?  ? Not Delegated - Ear, Nose, and Throat: Nasal Preparations - Corticosteroids Failed - 02/25/2022  4:25 PM  ?  ?  Failed - This refill cannot be delegated  ?  ?  Passed - Valid encounter within last 12 months  ?  Recent Outpatient Visits   ? ?      ? 11 months ago Hyperlipidemia LDL goal <130  ? [facility] Alba Cory, MD  ? 1 year ago Annual physical exam  ? [facility] Danelle Berry, PA-C  ? 1 year ago Encounter for completion of form with patient  ? [facility] Danelle Berry, PA-C  ? 2 years ago Well woman exam with routine gynecological exam  ? [facility] Danelle Berry, PA-C  ? 2 years ago Acute right hip pain  ? [facility] Danelle Berry, [place]  ? ?  ?  ?Future Appointments   ? ?        ? In 3 weeks Margarita Mail, DO [facility], [facility]  ? ?  ? ?  ?  ?  ? ? ? ?Requested Prescriptions  ?Pending Prescriptions Disp Refills  ? fluticasone (FLONASE) 50 MCG/ACT nasal spray [Pharmacy Med Name: Fluticasone Propionate 50 MCG/ACT Nasal Suspension] 48 g 0  ?  Sig: Use 2 spray(s) in each nostril once daily  ?  ? Not Delegated - Ear, Nose, and Throat: Nasal Preparations - Corticosteroids Failed - 02/25/2022  4:25 PM  ?  ?  Failed - This refill cannot be delegated  ?  ?  Passed - Valid encounter within last 12 months  ?  Recent Outpatient Visits   ? ?      ? 11 months ago Hyperlipidemia LDL goal <130  ? [facility]  Alba Cory, MD  ? 1 year ago Annual physical exam  ? [facility] Danelle Berry, PA-C  ? 1 year ago Encounter for completion of form with patient  ? [facility] Danelle Berry, PA-C  ? 2 years ago Well woman exam with routine gynecological exam  ? [facility] Danelle Berry, PA-C  ? 2 years ago Acute right hip pain  ? [facility] Danelle Berry, [place]  ? ?  ?  ?Future Appointments   ? ?        ? In 3 weeks Margarita Mail, DO [facility], [facility]  ? ?  ? ?  ?  ?  ? ? ? ?

## 2022-03-21 ENCOUNTER — Other Ambulatory Visit: Payer: Self-pay

## 2022-03-21 DIAGNOSIS — Z1231 Encounter for screening mammogram for malignant neoplasm of breast: Secondary | ICD-10-CM

## 2022-03-23 ENCOUNTER — Ambulatory Visit (INDEPENDENT_AMBULATORY_CARE_PROVIDER_SITE_OTHER): Payer: BC Managed Care – PPO | Admitting: Internal Medicine

## 2022-03-23 ENCOUNTER — Encounter: Payer: Self-pay | Admitting: Internal Medicine

## 2022-03-23 VITALS — BP 120/76 | HR 76 | Temp 98.0°F | Resp 16 | Ht 64.0 in | Wt 179.1 lb

## 2022-03-23 DIAGNOSIS — E782 Mixed hyperlipidemia: Secondary | ICD-10-CM

## 2022-03-23 DIAGNOSIS — F411 Generalized anxiety disorder: Secondary | ICD-10-CM

## 2022-03-23 DIAGNOSIS — J301 Allergic rhinitis due to pollen: Secondary | ICD-10-CM | POA: Diagnosis not present

## 2022-03-23 DIAGNOSIS — K219 Gastro-esophageal reflux disease without esophagitis: Secondary | ICD-10-CM

## 2022-03-23 DIAGNOSIS — R7303 Prediabetes: Secondary | ICD-10-CM | POA: Diagnosis not present

## 2022-03-23 MED ORDER — FLUTICASONE PROPIONATE 50 MCG/ACT NA SUSP: NASAL | 2 refills | Status: DC

## 2022-03-23 MED ORDER — PANTOPRAZOLE SODIUM 40 MG PO TBEC: 40.0000 mg | DELAYED_RELEASE_TABLET | Freq: Every day | ORAL | 3 refills | Status: DC

## 2022-03-23 MED ORDER — ROSUVASTATIN CALCIUM 5 MG PO TABS: 5.0000 mg | ORAL_TABLET | Freq: Every day | ORAL | 3 refills | Status: DC

## 2022-03-23 MED ORDER — VENLAFAXINE HCL ER 37.5 MG PO TB24: 1.0000 | ORAL_TABLET | Freq: Every day | ORAL | 3 refills | Status: DC

## 2022-03-23 NOTE — Assessment & Plan Note (Signed)
Stable continue Protonix 40 mg daily.  ?

## 2022-03-23 NOTE — Patient Instructions (Addendum)
It was great seeing you today! ? ?Plan discussed at today's visit: ?-Blood work ordered today, results will be uploaded to MyChart. Please come in a few days for blood work, fast for 8-12 hours  ?-Restart Claritin and Singulair for allergies, can try Zaditor eye drops  ?-No changes to medications made, refilled today  ?-Mammogram ordered, please call to schedule ? ?Follow up in: 1 year  ? ?Take care and let us know if you have any questions or concerns prior to your next visit. ? ?Dr. Caralee Ates ? ?

## 2022-03-23 NOTE — Assessment & Plan Note (Signed)
Reviewed last lipid panel with the patient, doing much better. Continue Crestor 5 mg daily, refills sent today and will recheck lipid panel.  ?

## 2022-03-23 NOTE — Assessment & Plan Note (Signed)
Recheck A1c today 

## 2022-03-23 NOTE — Progress Notes (Signed)
? ?Established Patient Office Visit ? ?Subjective:  ?Patient ID: Stephanie Koch, female    DOB: 1966/03/08  Age: 56 y.o. MRN: [id number] ? ?CC:  ?Chief Complaint  ?Patient presents with  ? Annual Exam  ? ? ?HPI ?Stephanie Koch presents for routine follow up on chronic medical conditions.  ? ?HLD: ?-Medications: Crestor 5 mg ?-Patient is compliant with above medications and reports no side effects. ?-Last lipid panel: Lipid Panel  ?   ?Component Value Date/Time  ? CHOL 168 03/30/2021 0908  ? CHOL 260 (H) 03/23/2016 0933  ? TRIG 110 03/30/2021 0908  ? HDL 61 03/30/2021 0908  ? HDL 61 03/23/2016 0933  ? CHOLHDL 2.8 03/30/2021 0908  ? VLDL 29 03/27/2017 0927  ? LDLCALC 86 03/30/2021 0908  ? LABVLDL 35 03/23/2016 0933  ? ?The 10-year ASCVD risk score (Arnett DK, et al., 2019) is: 1.4% ?  Values used to calculate the score: ?    Age: 88 years ?    Sex: Female ?    Is Non-Hispanic African American: No ?    Diabetic: No ?    Tobacco smoker: No ?    Systolic Blood Pressure: 130 mmHg ?    Is BP treated: No ?    HDL Cholesterol: 61 mg/dL ?    Total Cholesterol: 168 mg/dL ? ?Pre-Diabetes: ?-Last A1c  2/22 5.7% ?-Not currently on any medications ?-Does have family history of diabetes in her mother and maternal grandmother  ? ?GERD: ?-Currently on Protonix 40  ?-Symptoms include chronic cough, epigastric pain, indigestion but controlled with medication ?-Did have EGD in 3/21 biopsy negative for Barrett's but does have acid reflux and related esophagitis   ? ?Season Allergic Rhinitis:  ?-Currently on Flonase for allergy symptoms  ?-Last few days having more sneezing and eyes itching. Will restart Singulair, Claritin  ? ?Anxiety: ?-Duration: been on Effexor for 15 years  ?-Anxious mood: yes  ? ? ?  03/23/2022  ?  9:15 AM 03/30/2021  ?  8:38 AM 01/20/2021  ?  9:10 AM 09/30/2020  ? 11:44 AM 12/30/2019  ?  8:09 AM  ?Depression screen PHQ 2/9  ?Decreased Interest 0 0 0 0 0  ?Down, Depressed, Hopeless 0 0 0 0 0  ?PHQ - 2 Score 0 0 0 0 0   ?Altered sleeping 0  0  0  ?Tired, decreased energy 0  0  0  ?Change in appetite 0  0  0  ?Feeling bad or failure about yourself  0  0  0  ?Trouble concentrating 0  0  0  ?Moving slowly or fidgety/restless 0  0  0  ?Suicidal thoughts 0  0  0  ?PHQ-9 Score 0  0  0  ?Difficult doing work/chores Not difficult at all  Not difficult at all  Not difficult at all  ? ?-Current Treatments: Effexor 37.5 ?-Patient is compliant with the above medications at above dose and reports no side effects.  ? ?Health Maintenance: ?-Blood work due ?-Mammogram due ?-Colon cancer screening: colonoscopy 3/21, repeat in 10 years ? ?Past Medical History:  ?Diagnosis Date  ? Abnormal Pap smear of cervix 08/2013  ? ASCUS pap with HR HPV Neg. Needs repeat pap with cotesting 2017.  ? Allergy   ? Anxiety   ? Barrett's esophagus 07/2010  ? EGD with biopsy confirmed  ? Hyperlipidemia   ? 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 09/12/2013  ? HPV negative; repeat April 2017 NIL, negative  HPV  ? ? ?Past Surgical History:  ?Procedure Laterality Date  ? COLONOSCOPY WITH PROPOFOL N/A 03/06/2020  ? Procedure: COLONOSCOPY WITH PROPOFOL;  Surgeon: [name], MD;  Location: [facility] ENDOSCOPY;  Service: Gastroenterology;  Laterality: N/A;  ? ESOPHAGOGASTRODUODENOSCOPY (EGD) WITH PROPOFOL N/A 03/06/2020  ? Procedure: ESOPHAGOGASTRODUODENOSCOPY (EGD) WITH PROPOFOL;  Surgeon: [name], MD;  Location[facility] ENDOSCOPY;  Service: Gastroenterology;  Laterality: N/A;  ? ? ?Family History  ?Problem Relation Age of Onset  ? Diabetes Mother   ? Hypertension Mother   ? Hyperlipidemia Mother   ? Stroke Father   ? Hypertension Father   ? Hyperlipidemia Father   ? Cancer Paternal Uncle   ?     bowel  ? Diabetes Maternal Grandmother   ? Heart disease Maternal Grandmother   ? Congestive Heart Failure Maternal Grandmother   ? Cancer Maternal Grandfather   ?     lung  ? Stroke Paternal Grandfather   ? Emphysema Paternal Grandmother   ? COPD Neg Hx   ? ? ?Social  History  ? ?Socioeconomic History  ? Marital status: Married  ?  Spouse name: Audelia Acton  ? Number of children: 1  ? Years of education: 29  ? Highest education level: Some college, no degree  ?Occupational History  ? Not on file  ?Tobacco Use  ? Smoking status: Never  ? Smokeless tobacco: Never  ?Vaping Use  ? Vaping Use: Never used  ?Substance and Sexual Activity  ? Alcohol use: No  ? Drug use: No  ? Sexual activity: Not on file  ?Other Topics Concern  ? Not on file  ?Social History Narrative  ? Not on file  ? ?Social Determinants of Health  ? ?Financial Resource Strain: Not on file  ?Food Insecurity: Not on file  ?Transportation Needs: Not on file  ?Physical Activity: Not on file  ?Stress: Not on file  ?Social Connections: Not on file  ?Intimate Partner Violence: Not on file  ? ? ?Outpatient Medications Prior to Visit  ?Medication Sig Dispense Refill  ? Calcium Carbonate-Vit D-Min (CALCIUM 1200 PO) Take by mouth.    ? cholecalciferol (VITAMIN D) 1000 units tablet Take 1,000 Units by mouth daily.    ? fluticasone (FLONASE) 50 MCG/ACT nasal spray Use 2 spray(s) in each nostril once daily 48 g 0  ? loratadine (CLARITIN) 10 MG tablet Take 10 mg by mouth daily.    ? montelukast (SINGULAIR) 10 MG tablet Take 1 tablet (10 mg total) by mouth at bedtime. 90 tablet 1  ? nitrofurantoin, macrocrystal-monohydrate, (MACROBID) 100 MG capsule Take 1 capsule (100 mg total) by mouth 2 (two) times daily. 10 capsule 0  ? pantoprazole (PROTONIX) 40 MG tablet Take 1 tablet by mouth once daily 90 tablet 0  ? rosuvastatin (CRESTOR) 5 MG tablet Take 1 tablet by mouth once daily 90 tablet 3  ? Venlafaxine HCl 37.5 MG TB24 Take 1 tablet by mouth once daily 30 tablet 0  ? ?No facility-administered medications prior to visit.  ? ? ?No Known Allergies ? ?ROS ?Review of Systems  ?Constitutional:  Negative for chills and fever.  ?Eyes:  Negative for visual disturbance.  ?Respiratory:  Negative for cough and shortness of breath.   ?Cardiovascular:   Negative for chest pain.  ?Gastrointestinal:  Negative for abdominal pain, nausea and vomiting.  ?Neurological:  Negative for dizziness and headaches.  ? ?  ?Objective:  ?  ?Physical Exam ?Constitutional:   ?   Appearance: Normal appearance.  ?HENT:  ?  Head: Normocephalic and atraumatic.  ?   Mouth/Throat:  ?   Mouth: Mucous membranes are moist.  ?   Comments: PND present ?Eyes:  ?   Conjunctiva/sclera: Conjunctivae normal.  ?Cardiovascular:  ?   Rate and Rhythm: Normal rate and regular rhythm.  ?Pulmonary:  ?   Effort: Pulmonary effort is normal.  ?   Breath sounds: Normal breath sounds.  ?Abdominal:  ?   General: There is no distension.  ?   Palpations: Abdomen is soft.  ?   Tenderness: There is no abdominal tenderness.  ?Musculoskeletal:  ?   Right lower leg: No edema.  ?   Left lower leg: No edema.  ?Skin: ?   General: Skin is warm and dry.  ?Neurological:  ?   General: No focal deficit present.  ?   Mental Status: She is alert. Mental status is at baseline.  ?Psychiatric:     ?   Mood and Affect: Mood normal.     ?   Behavior: Behavior normal.  ? ? ?BP 120/76   Pulse 76   Temp 98 ?F (36.7 ?C)   Resp 16   Ht 5\' 4"  (1.626 m)   Wt 179 lb 1.6 oz (81.2 kg)   LMP 11/14/2015 (Approximate)   SpO2 96%   BMI 30.74 kg/m?  ?Wt Readings from Last 3 Encounters:  ?03/30/21 175 lb (79.4 kg)  ?01/20/21 175 lb 9.6 oz (79.7 kg)  ?09/30/20 177 lb 3.2 oz (80.4 kg)  ? ? ? ?Health Maintenance Due  ?Topic Date Due  ? MAMMOGRAM  Never done  ? Zoster Vaccines- Shingrix (1 of 2) Never done  ? COVID-19 Vaccine (3 - Booster for Moderna series) 05/09/2020  ? ? ?There are no preventive care reminders to display for this patient. ? ?Lab Results  ?Component Value Date  ? TSH 1.69 12/26/2018  ? ?Lab Results  ?Component Value Date  ? WBC 7.1 01/20/2021  ? HGB 12.7 01/20/2021  ? HCT 37.2 01/20/2021  ? MCV 94.2 01/20/2021  ? PLT 330 01/20/2021  ? ?Lab Results  ?Component Value Date  ? NA 142 01/20/2021  ? K 4.5 01/20/2021  ? CO2 30  01/20/2021  ? GLUCOSE 105 (H) 01/20/2021  ? BUN 18 01/20/2021  ? CREATININE 0.86 01/20/2021  ? BILITOT 0.8 03/30/2021  ? ALKPHOS 43 03/27/2017  ? AST 20 03/30/2021  ? ALT 18 03/30/2021  ? PROT 6.8 03/30/2021  ? ALBUMIN

## 2022-03-23 NOTE — Assessment & Plan Note (Signed)
Stable, doing well on Effexor 37.5 mg, refilled today. ?

## 2022-03-23 NOTE — Assessment & Plan Note (Signed)
Restart Claritin and Singulair as she is currently starting to have seasonal allergy symptoms. Given a sample of Ryaltris nasal spray for both nasal steroid and anti-histamine effects.  ?

## 2022-03-26 LAB — COMPLETE METABOLIC PANEL WITH GFR
AG Ratio: 1.4 (calc) (ref 1.0–2.5)
ALT: 13 U/L (ref 6–29)
AST: 16 U/L (ref 10–35)
Albumin: 4 g/dL (ref 3.6–5.1)
Alkaline phosphatase (APISO): 44 U/L (ref 37–153)
BUN: 15 mg/dL (ref 7–25)
CO2: 26 mmol/L (ref 20–32)
Calcium: 8.9 mg/dL (ref 8.6–10.4)
Chloride: 106 mmol/L (ref 98–110)
Creat: 0.87 mg/dL (ref 0.50–1.03)
Globulin: 2.9 g/dL (calc) (ref 1.9–3.7)
Glucose, Bld: 95 mg/dL (ref 65–99)
Potassium: 4 mmol/L (ref 3.5–5.3)
Sodium: 142 mmol/L (ref 135–146)
Total Bilirubin: 0.8 mg/dL (ref 0.2–1.2)
Total Protein: 6.9 g/dL (ref 6.1–8.1)
eGFR: 79 mL/min/{1.73_m2} (ref >=60)

## 2022-03-26 LAB — CBC WITH DIFFERENTIAL/PLATELET
Absolute Monocytes: 665 cells/uL (ref 200–950)
Basophils Absolute: 21 cells/uL (ref 0–200)
Basophils Relative: 0.3 %
Eosinophils Absolute: 161 cells/uL (ref 15–500)
Eosinophils Relative: 2.3 %
HCT: 35.6 % (ref 35.0–45.0)
Hemoglobin: 11.9 g/dL (ref 11.7–15.5)
Lymphs Abs: 1372 cells/uL (ref 850–3900)
MCH: 31.8 pg (ref 27.0–33.0)
MCHC: 33.4 g/dL (ref 32.0–36.0)
MCV: 95.2 fL (ref 80.0–100.0)
MPV: 10.9 fL (ref 7.5–12.5)
Monocytes Relative: 9.5 %
Neutro Abs: 4781 cells/uL (ref 1500–7800)
Neutrophils Relative %: 68.3 %
Platelets: 286 10*3/uL (ref 140–400)
RBC: 3.74 10*6/uL — ABNORMAL LOW (ref 3.80–5.10)
RDW: 12.2 % (ref 11.0–15.0)
Total Lymphocyte: 19.6 %
WBC: 7 10*3/uL (ref 3.8–10.8)

## 2022-03-26 LAB — LIPID PANEL
Cholesterol: 138 mg/dL (ref <200)
HDL: 49 mg/dL — ABNORMAL LOW (ref >=50)
LDL Cholesterol (Calc): 70 mg/dL (calc)
Non-HDL Cholesterol (Calc): 89 mg/dL (calc) (ref <130)
Total CHOL/HDL Ratio: 2.8 (calc) (ref <5.0)
Triglycerides: 106 mg/dL (ref <150)

## 2022-03-26 LAB — HEMOGLOBIN A1C
Hgb A1c MFr Bld: 5.9 % of total Hgb — ABNORMAL HIGH (ref <5.7)
Mean Plasma Glucose: 123 mg/dL
eAG (mmol/L): 6.8 mmol/L

## 2022-03-28 ENCOUNTER — Telehealth: Payer: Self-pay | Admitting: Internal Medicine

## 2022-03-28 DIAGNOSIS — F411 Generalized anxiety disorder: Secondary | ICD-10-CM

## 2022-03-28 NOTE — Telephone Encounter (Signed)
? ?  Notes to clinic:  Pharm requests as follows:  ?Pharmacy comment: PLEASE RESEND FOR TABLETS PER INSURANCE. THANKS. ? ?Unclear as it looks like it is already ordered as tablets? ? ?  ? ?Requested Prescriptions  ?Pending Prescriptions Disp Refills  ? Venlafaxine HCl 37.5 MG TB24 [Pharmacy Med Name: VENLAFAXINE ER 37.5MG    TAB] 90 tablet 3  ?  Sig: TAKE 1 TABLET BY MOUTH ONCE DAILY  ?  ? Psychiatry: Antidepressants - SNRI - desvenlafaxine & venlafaxine Failed - 03/28/2022  9:47 AM  ?  ?  Failed - Lipid Panel in normal range within the last 12 months  ?  Cholesterol, Total  ?Date Value Ref Range Status  ?03/23/2016 260 (H) 100 - 199 mg/dL Final  ? ?Cholesterol  ?Date Value Ref Range Status  ?03/25/2022 138 <200 mg/dL Final  ? ?LDL Cholesterol (Calc)  ?Date Value Ref Range Status  ?03/25/2022 70 mg/dL (calc) Final  ?  Comment:  ?  Reference range: <100 ?Marland Kitchen ?Desirable range <100 mg/dL for primary prevention;   ?<70 mg/dL for patients with CHD or diabetic patients  ?with > or = 2 CHD risk factors. ?. ?LDL-C is now calculated using the Martin-Hopkins  ?calculation, which is a validated novel method providing  ?better accuracy than the Friedewald equation in the  ?estimation of LDL-C.  ?Horald Pollen et al. Lenox Ahr. 1884;166(06): 2061-2068  ?(http://education.[facility].com/faq/FAQ164) ?  ? ?HDL  ?Date Value Ref Range Status  ?03/25/2022 49 (L) > OR = 50 mg/dL Final  ?30/16/0109 61 >32 mg/dL Final  ? ?Triglycerides  ?Date Value Ref Range Status  ?03/25/2022 106 <150 mg/dL Final  ? ?  ?  ?  Passed - Cr in normal range and within 360 days  ?  Creat  ?Date Value Ref Range Status  ?03/25/2022 0.87 0.50 - 1.03 mg/dL Final  ?  ?  ?  ?  Passed - Last BP in normal range  ?  BP Readings from Last 1 Encounters:  ?03/23/22 120/76  ?  ?  ?  ?  Passed - Valid encounter within last 6 months  ?  Recent Outpatient Visits   ? ?      ? 5 days ago Mixed hyperlipidemia  ? [facility] Margarita Mail, DO  ? 12 months ago  Hyperlipidemia LDL goal <130  ? [facility] Alba Cory, MD  ? 1 year ago Annual physical exam  ? [facility] Danelle Berry, PA-C  ? 1 year ago Encounter for completion of form with patient  ? [facility] Danelle Berry, PA-C  ? 2 years ago Well woman exam with routine gynecological exam  ? [facility] Danelle Berry, [place]  ? ?  ?  ?Future Appointments   ? ?        ? In 12 months Danelle Berry, PA-C [facility]  ? ?  ? ? ?  ?  ?  ? ? ?

## 2022-03-31 ENCOUNTER — Other Ambulatory Visit: Payer: Self-pay | Admitting: Internal Medicine

## 2022-03-31 DIAGNOSIS — F411 Generalized anxiety disorder: Secondary | ICD-10-CM

## 2022-03-31 NOTE — Telephone Encounter (Signed)
Pt is calling because she has one pill left and the pharmacy is asking for clairfication . Requesting to resend for tablets per the insurance. Unclear as it looks it looks as tablets have been ordered. Please advise CB- [phone] ?

## 2022-04-01 NOTE — Telephone Encounter (Signed)
[facility] pharmacy, Stephanie Koch calling again this morning for med change request.  ?Pt insurance will not pay for ?Venlafaxine HCl 37.5 MG TB24 ? ?They will pay for the capsules. ? ?Please send new Rx for  ?Venlafaxine HCl 37.5 MG CAPSULES ?

## 2022-05-21 ENCOUNTER — Other Ambulatory Visit: Payer: Self-pay | Admitting: Family Medicine

## 2022-05-21 DIAGNOSIS — J301 Allergic rhinitis due to pollen: Secondary | ICD-10-CM

## 2022-06-21 ENCOUNTER — Ambulatory Visit: Payer: Self-pay | Admitting: *Deleted

## 2022-06-21 ENCOUNTER — Other Ambulatory Visit: Payer: Self-pay | Admitting: Family Medicine

## 2022-06-21 DIAGNOSIS — F411 Generalized anxiety disorder: Secondary | ICD-10-CM

## 2022-06-21 MED ORDER — VENLAFAXINE HCL ER 37.5 MG PO CP24: 37.5000 mg | ORAL_CAPSULE | Freq: Every day | ORAL | 3 refills | Status: DC

## 2022-06-21 NOTE — Telephone Encounter (Signed)
Last refill did not go through to pharmacy for refills per patient.  Requested Prescriptions  Pending Prescriptions Disp Refills  . venlafaxine XR (EFFEXOR XR) 37.5 MG 24 hr capsule 90 capsule 3    Sig: Take 1 capsule (37.5 mg total) by mouth daily with breakfast.     Psychiatry: Antidepressants - SNRI - desvenlafaxine & venlafaxine Failed - 06/21/2022 11:13 AM      Failed - Lipid Panel in normal range within the last 12 months    Cholesterol, Total  Date Value Ref Range Status  03/23/2016 260 (H) 100 - 199 mg/dL Final   Cholesterol  Date Value Ref Range Status  03/25/2022 138 <200 mg/dL Final   LDL Cholesterol (Calc)  Date Value Ref Range Status  03/25/2022 70 mg/dL (calc) Final    Comment:    Reference range: <100 . Desirable range <100 mg/dL for primary prevention;   <70 mg/dL for patients with CHD or diabetic patients  with > or = 2 CHD risk factors. Marland Kitchen LDL-C is now calculated using the Martin-Hopkins  calculation, which is a validated novel method providing  better accuracy than the Friedewald equation in the  estimation of LDL-C.  Horald Pollen et al. Lenox Ahr. 8341;962(22): 2061-2068  (http://education.[facility].com/faq/FAQ164)    HDL  Date Value Ref Range Status  03/25/2022 49 (L) > OR = 50 mg/dL Final  97/98/9211 61 >94 mg/dL Final   Triglycerides  Date Value Ref Range Status  03/25/2022 106 <150 mg/dL Final         Passed - Cr in normal range and within 360 days    Creat  Date Value Ref Range Status  03/25/2022 0.87 0.50 - 1.03 mg/dL Final         Passed - Last BP in normal range    BP Readings from Last 1 Encounters:  03/23/22 120/76         Passed - Valid encounter within last 6 months    Recent Outpatient Visits          3 months ago Mixed hyperlipidemia   [facility] Margarita Mail, DO   1 year ago Hyperlipidemia LDL goal <130   [facility] Alba Cory, MD   1 year ago Annual physical exam    [facility] Danelle Berry, PA-C   1 year ago Encounter for completion of form with patient   [facility] Danelle Berry, PA-C   2 years ago Well woman exam with routine gynecological exam   [facility] Danelle Berry, PA-C      Future Appointments            In 9 months Danelle Berry, PA-C [facility]

## 2022-06-21 NOTE — Telephone Encounter (Signed)
Pts insurance will not cover venlafaxine Tablets and pt needs the Rx as capsules/ the Rx for venlafaxine XR (EFFEXOR XR) 37.5 MG 24 hr capsule was sent to the pharmacy on 4.21.23 but didn't go through and transmission failed so pt never received new script for capsules and has been paying $50 per refill / please resend Rx asap to [facility] on [address]

## 2022-12-15 ENCOUNTER — Other Ambulatory Visit: Payer: Self-pay

## 2022-12-15 ENCOUNTER — Encounter: Payer: Self-pay | Admitting: Emergency Medicine

## 2022-12-15 ENCOUNTER — Emergency Department: Payer: BC Managed Care – PPO | Admitting: General Practice

## 2022-12-15 ENCOUNTER — Observation Stay
Admission: EM | Admit: 2022-12-15 | Discharge: 2022-12-16 | Disposition: A | Discharge status: Home / Self Care | Payer: BC Managed Care – PPO | Attending: Surgery | Admitting: Surgery

## 2022-12-15 ENCOUNTER — Observation Stay
Admission: RE | Admit: 2022-12-15 | Discharge: 2022-12-15 | Disposition: A | Discharge status: Home / Self Care | Payer: BC Managed Care – PPO | Source: Ambulatory Visit | Attending: Nurse Practitioner | Admitting: Nurse Practitioner

## 2022-12-15 ENCOUNTER — Ambulatory Visit: Payer: BC Managed Care – PPO | Admitting: Nurse Practitioner

## 2022-12-15 ENCOUNTER — Encounter: Payer: Self-pay | Admitting: Nurse Practitioner

## 2022-12-15 ENCOUNTER — Encounter
Admission: EM | Disposition: A | Discharge status: Home / Self Care | Payer: Self-pay | Source: Home / Self Care | Attending: Emergency Medicine

## 2022-12-15 ENCOUNTER — Other Ambulatory Visit
Admission: RE | Admit: 2022-12-15 | Discharge: 2022-12-15 | Disposition: A | Discharge status: Home / Self Care | Payer: BC Managed Care – PPO | Source: Ambulatory Visit | Attending: Nurse Practitioner | Admitting: Nurse Practitioner

## 2022-12-15 VITALS — BP 130/72 | HR 68 | Temp 98.3°F | Resp 16 | Ht 64.0 in | Wt 179.3 lb

## 2022-12-15 DIAGNOSIS — R1031 Right lower quadrant pain: Secondary | ICD-10-CM

## 2022-12-15 DIAGNOSIS — K353 Acute appendicitis with localized peritonitis, without perforation or gangrene: Secondary | ICD-10-CM | POA: Diagnosis not present

## 2022-12-15 DIAGNOSIS — M545 Low back pain, unspecified: Secondary | ICD-10-CM

## 2022-12-15 DIAGNOSIS — K37 Unspecified appendicitis: Secondary | ICD-10-CM | POA: Diagnosis present

## 2022-12-15 HISTORY — DX: Unspecified appendicitis: K37

## 2022-12-15 HISTORY — DX: Acute appendicitis with localized peritonitis, without perforation or gangrene: K35.30

## 2022-12-15 HISTORY — PX: LAPAROSCOPIC APPENDECTOMY: SHX408

## 2022-12-15 HISTORY — DX: Right lower quadrant pain: R10.31

## 2022-12-15 LAB — CBC WITH DIFFERENTIAL/PLATELET
Abs Immature Granulocytes: 0.02 10*3/uL (ref 0.00–0.07)
Basophils Absolute: 0 10*3/uL (ref 0.0–0.1)
Basophils Relative: 0 %
Eosinophils Absolute: 0.1 10*3/uL (ref 0.0–0.5)
Eosinophils Relative: 2 %
HCT: 33.6 % — ABNORMAL LOW (ref 36.0–46.0)
Hemoglobin: 11.3 g/dL — ABNORMAL LOW (ref 12.0–15.0)
Immature Granulocytes: 0 %
Lymphocytes Relative: 28 %
Lymphs Abs: 2 10*3/uL (ref 0.7–4.0)
MCH: 31.6 pg (ref 26.0–34.0)
MCHC: 33.6 g/dL (ref 30.0–36.0)
MCV: 93.9 fL (ref 80.0–100.0)
Monocytes Absolute: 0.7 10*3/uL (ref 0.1–1.0)
Monocytes Relative: 10 %
Neutro Abs: 4.1 10*3/uL (ref 1.7–7.7)
Neutrophils Relative %: 60 %
Platelets: 246 10*3/uL (ref 150–400)
RBC: 3.58 MIL/uL — ABNORMAL LOW (ref 3.87–5.11)
RDW: 12.3 % (ref 11.5–15.5)
Smear Review: NORMAL
WBC: 7 10*3/uL (ref 4.0–10.5)
nRBC: 0 % (ref 0.0–0.2)

## 2022-12-15 LAB — POCT URINALYSIS DIPSTICK
Bilirubin, UA: NEGATIVE
Glucose, UA: NEGATIVE
Ketones, UA: NEGATIVE
Nitrite, UA: NEGATIVE
Protein, UA: NEGATIVE
Spec Grav, UA: 1.01 (ref 1.010–1.025)
Urobilinogen, UA: 0.2 E.U./dL
pH, UA: 5 (ref 5.0–8.0)

## 2022-12-15 LAB — COMPREHENSIVE METABOLIC PANEL
ALT: 23 U/L (ref 0–44)
AST: 21 U/L (ref 15–41)
Albumin: 3.8 g/dL (ref 3.5–5.0)
Alkaline Phosphatase: 40 U/L (ref 38–126)
Anion gap: 9 (ref 5–15)
BUN: 11 mg/dL (ref 6–20)
CO2: 26 mmol/L (ref 22–32)
Calcium: 8.6 mg/dL — ABNORMAL LOW (ref 8.9–10.3)
Chloride: 103 mmol/L (ref 98–111)
Creatinine, Ser: 0.84 mg/dL (ref 0.44–1.00)
GFR, Estimated: >60 mL/min (ref >60)
Glucose, Bld: 92 mg/dL (ref 70–99)
Potassium: 3.8 mmol/L (ref 3.5–5.1)
Sodium: 138 mmol/L (ref 135–145)
Total Bilirubin: 1.1 mg/dL (ref 0.3–1.2)
Total Protein: 7.2 g/dL (ref 6.5–8.1)

## 2022-12-15 SURGERY — APPENDECTOMY, LAPAROSCOPIC
Anesthesia: General

## 2022-12-15 MED ORDER — FENTANYL CITRATE (PF) 100 MCG/2ML IJ SOLN
INTRAMUSCULAR | Status: AC
  Administered 2022-12-15: 50 ug via INTRAVENOUS
  Filled 2022-12-15: qty 2

## 2022-12-15 MED ORDER — HYDROMORPHONE HCL 1 MG/ML IJ SOLN: 0.5000 mg | INTRAMUSCULAR | Status: DC | PRN

## 2022-12-15 MED ORDER — EPHEDRINE 5 MG/ML INJ
INTRAVENOUS | Status: AC
  Filled 2022-12-15: qty 5

## 2022-12-15 MED ORDER — SODIUM CHLORIDE (PF) 0.9 % IJ SOLN
INTRAMUSCULAR | Status: AC
  Filled 2022-12-15: qty 50

## 2022-12-15 MED ORDER — KETOROLAC TROMETHAMINE 30 MG/ML IJ SOLN
INTRAMUSCULAR | Status: AC
  Filled 2022-12-15: qty 1

## 2022-12-15 MED ORDER — METRONIDAZOLE 500 MG/100ML IV SOLN
500.0000 mg | Freq: Once | INTRAVENOUS | Status: AC
  Administered 2022-12-15: 500 mg via INTRAVENOUS
  Filled 2022-12-15: qty 100

## 2022-12-15 MED ORDER — ONDANSETRON HCL 4 MG/2ML IJ SOLN: 4.0000 mg | Freq: Four times a day (QID) | INTRAMUSCULAR | Status: DC | PRN

## 2022-12-15 MED ORDER — GLYCOPYRROLATE 0.2 MG/ML IJ SOLN
INTRAMUSCULAR | Status: DC | PRN
  Administered 2022-12-15: .2 mg via INTRAVENOUS

## 2022-12-15 MED ORDER — SODIUM CHLORIDE 0.9 % IV SOLN
2.0000 g | Freq: Once | INTRAVENOUS | Status: AC
  Administered 2022-12-15: 2 g via INTRAVENOUS
  Filled 2022-12-15: qty 20

## 2022-12-15 MED ORDER — VENLAFAXINE HCL ER 37.5 MG PO CP24
37.5000 mg | ORAL_CAPSULE | Freq: Every day | ORAL | Status: DC
  Administered 2022-12-16: 37.5 mg via ORAL
  Filled 2022-12-15 (×2): qty 1

## 2022-12-15 MED ORDER — PROCHLORPERAZINE EDISYLATE 10 MG/2ML IJ SOLN: 5.0000 mg | Freq: Four times a day (QID) | INTRAMUSCULAR | Status: DC | PRN

## 2022-12-15 MED ORDER — DROPERIDOL 2.5 MG/ML IJ SOLN
INTRAMUSCULAR | Status: AC
  Filled 2022-12-15: qty 2

## 2022-12-15 MED ORDER — IOHEXOL 300 MG/ML  SOLN
100.0000 mL | Freq: Once | INTRAMUSCULAR | Status: AC | PRN
  Administered 2022-12-15: 100 mL via INTRAVENOUS

## 2022-12-15 MED ORDER — ROCURONIUM BROMIDE 10 MG/ML (PF) SYRINGE
PREFILLED_SYRINGE | INTRAVENOUS | Status: AC
  Filled 2022-12-15: qty 10

## 2022-12-15 MED ORDER — KETOROLAC TROMETHAMINE 30 MG/ML IJ SOLN
INTRAMUSCULAR | Status: DC | PRN
  Administered 2022-12-15: 30 mg via INTRAVENOUS

## 2022-12-15 MED ORDER — FENTANYL CITRATE (PF) 100 MCG/2ML IJ SOLN
25.0000 ug | INTRAMUSCULAR | Status: DC | PRN
  Administered 2022-12-15: 50 ug via INTRAVENOUS

## 2022-12-15 MED ORDER — OXYCODONE HCL 5 MG/5ML PO SOLN: 5.0000 mg | Freq: Once | ORAL | Status: DC | PRN

## 2022-12-15 MED ORDER — OXYCODONE HCL 5 MG PO TABS: 5.0000 mg | ORAL_TABLET | ORAL | Status: DC | PRN

## 2022-12-15 MED ORDER — ONDANSETRON HCL 4 MG/2ML IJ SOLN
INTRAMUSCULAR | Status: AC
  Filled 2022-12-15: qty 2

## 2022-12-15 MED ORDER — PROPOFOL 10 MG/ML IV BOLUS
INTRAVENOUS | Status: AC
  Filled 2022-12-15: qty 20

## 2022-12-15 MED ORDER — MIDAZOLAM HCL 2 MG/2ML IJ SOLN
INTRAMUSCULAR | Status: DC | PRN
  Administered 2022-12-15: 2 mg via INTRAVENOUS

## 2022-12-15 MED ORDER — SUCCINYLCHOLINE CHLORIDE 200 MG/10ML IV SOSY
PREFILLED_SYRINGE | INTRAVENOUS | Status: DC | PRN
  Administered 2022-12-15: 100 mg via INTRAVENOUS

## 2022-12-15 MED ORDER — DIPHENHYDRAMINE HCL 50 MG/ML IJ SOLN: 12.5000 mg | Freq: Four times a day (QID) | INTRAMUSCULAR | Status: DC | PRN

## 2022-12-15 MED ORDER — EPINEPHRINE PF 1 MG/ML IJ SOLN
INTRAMUSCULAR | Status: AC
  Filled 2022-12-15: qty 1

## 2022-12-15 MED ORDER — SUGAMMADEX SODIUM 200 MG/2ML IV SOLN
INTRAVENOUS | Status: DC | PRN
  Administered 2022-12-15: 200 mg via INTRAVENOUS

## 2022-12-15 MED ORDER — LIDOCAINE HCL (CARDIAC) PF 100 MG/5ML IV SOSY
PREFILLED_SYRINGE | INTRAVENOUS | Status: DC | PRN
  Administered 2022-12-15: 50 mg via INTRAVENOUS

## 2022-12-15 MED ORDER — DEXAMETHASONE SODIUM PHOSPHATE 10 MG/ML IJ SOLN
INTRAMUSCULAR | Status: AC
  Filled 2022-12-15: qty 1

## 2022-12-15 MED ORDER — MELATONIN 5 MG PO TABS: 5.0000 mg | ORAL_TABLET | Freq: Every evening | ORAL | Status: DC | PRN

## 2022-12-15 MED ORDER — ENOXAPARIN SODIUM 40 MG/0.4ML IJ SOSY: 0.5000 mg/kg | PREFILLED_SYRINGE | INTRAMUSCULAR | Status: DC

## 2022-12-15 MED ORDER — ONDANSETRON HCL 4 MG/2ML IJ SOLN
4.0000 mg | Freq: Once | INTRAMUSCULAR | Status: AC
  Administered 2022-12-15: 4 mg via INTRAVENOUS
  Filled 2022-12-15: qty 2

## 2022-12-15 MED ORDER — BUPIVACAINE LIPOSOME 1.3 % IJ SUSP
INTRAMUSCULAR | Status: AC
  Filled 2022-12-15: qty 20

## 2022-12-15 MED ORDER — PROPOFOL 10 MG/ML IV BOLUS
INTRAVENOUS | Status: DC | PRN
  Administered 2022-12-15: 150 mg via INTRAVENOUS

## 2022-12-15 MED ORDER — CHLORHEXIDINE GLUCONATE CLOTH 2 % EX PADS
6.0000 | MEDICATED_PAD | Freq: Every day | CUTANEOUS | Status: DC
  Administered 2022-12-16: 6 via TOPICAL

## 2022-12-15 MED ORDER — SODIUM CHLORIDE 0.9 % IV SOLN: INTRAVENOUS | Status: DC | PRN

## 2022-12-15 MED ORDER — ACETAMINOPHEN 10 MG/ML IV SOLN
INTRAVENOUS | Status: DC | PRN
  Administered 2022-12-15: 1000 mg via INTRAVENOUS

## 2022-12-15 MED ORDER — ROCURONIUM BROMIDE 100 MG/10ML IV SOLN
INTRAVENOUS | Status: DC | PRN
  Administered 2022-12-15: 20 mg via INTRAVENOUS
  Administered 2022-12-15: 50 mg via INTRAVENOUS

## 2022-12-15 MED ORDER — FENTANYL CITRATE (PF) 100 MCG/2ML IJ SOLN
INTRAMUSCULAR | Status: AC
  Filled 2022-12-15: qty 2

## 2022-12-15 MED ORDER — KETOROLAC TROMETHAMINE 30 MG/ML IJ SOLN: 30.0000 mg | Freq: Four times a day (QID) | INTRAMUSCULAR | Status: DC | PRN

## 2022-12-15 MED ORDER — MIDAZOLAM HCL 2 MG/2ML IJ SOLN
INTRAMUSCULAR | Status: AC
  Filled 2022-12-15: qty 2

## 2022-12-15 MED ORDER — ACETAMINOPHEN 500 MG PO TABS
1000.0000 mg | ORAL_TABLET | Freq: Four times a day (QID) | ORAL | Status: DC
  Administered 2022-12-16: 1000 mg via ORAL
  Filled 2022-12-15: qty 2

## 2022-12-15 MED ORDER — OXYCODONE HCL 5 MG PO TABS: 5.0000 mg | ORAL_TABLET | Freq: Once | ORAL | Status: DC | PRN

## 2022-12-15 MED ORDER — FENTANYL CITRATE (PF) 100 MCG/2ML IJ SOLN
INTRAMUSCULAR | Status: DC | PRN
  Administered 2022-12-15 (×2): 50 ug via INTRAVENOUS

## 2022-12-15 MED ORDER — MORPHINE SULFATE (PF) 4 MG/ML IV SOLN
4.0000 mg | Freq: Once | INTRAVENOUS | Status: AC
  Administered 2022-12-15: 4 mg via INTRAVENOUS
  Filled 2022-12-15: qty 1

## 2022-12-15 MED ORDER — SODIUM CHLORIDE 0.9 % IV SOLN: INTRAVENOUS | Status: DC

## 2022-12-15 MED ORDER — ONDANSETRON HCL 4 MG/2ML IJ SOLN
INTRAMUSCULAR | Status: DC | PRN
  Administered 2022-12-15: 4 mg via INTRAVENOUS

## 2022-12-15 MED ORDER — ONDANSETRON 4 MG PO TBDP: 4.0000 mg | ORAL_TABLET | Freq: Four times a day (QID) | ORAL | Status: DC | PRN

## 2022-12-15 MED ORDER — DROPERIDOL 2.5 MG/ML IJ SOLN
1.2500 mg | Freq: Once | INTRAMUSCULAR | Status: AC
  Administered 2022-12-15: 1.25 mg via INTRAVENOUS

## 2022-12-15 MED ORDER — DEXAMETHASONE SODIUM PHOSPHATE 10 MG/ML IJ SOLN
INTRAMUSCULAR | Status: DC | PRN
  Administered 2022-12-15: 10 mg via INTRAVENOUS

## 2022-12-15 MED ORDER — ACETAMINOPHEN 10 MG/ML IV SOLN
INTRAVENOUS | Status: AC
  Filled 2022-12-15: qty 100

## 2022-12-15 MED ORDER — SODIUM CHLORIDE 0.9 % IV BOLUS
1000.0000 mL | Freq: Once | INTRAVENOUS | Status: AC
  Administered 2022-12-15: 1000 mL via INTRAVENOUS

## 2022-12-15 MED ORDER — PROCHLORPERAZINE MALEATE 10 MG PO TABS: 10.0000 mg | ORAL_TABLET | Freq: Four times a day (QID) | ORAL | Status: DC | PRN

## 2022-12-15 MED ORDER — EPHEDRINE SULFATE (PRESSORS) 50 MG/ML IJ SOLN
INTRAMUSCULAR | Status: DC | PRN
  Administered 2022-12-15: 5 mg via INTRAVENOUS
  Administered 2022-12-15: 10 mg via INTRAVENOUS

## 2022-12-15 MED ORDER — KETOROLAC TROMETHAMINE 30 MG/ML IJ SOLN
30.0000 mg | Freq: Four times a day (QID) | INTRAMUSCULAR | Status: DC
  Administered 2022-12-16: 30 mg via INTRAVENOUS
  Filled 2022-12-15: qty 1

## 2022-12-15 MED ORDER — FENTANYL CITRATE (PF) 100 MCG/2ML IJ SOLN
50.0000 ug | Freq: Once | INTRAMUSCULAR | Status: AC
  Administered 2022-12-15: 50 ug via INTRAVENOUS

## 2022-12-15 MED ORDER — LIDOCAINE HCL (PF) 2 % IJ SOLN
INTRAMUSCULAR | Status: AC
  Filled 2022-12-15: qty 5

## 2022-12-15 MED ORDER — MORPHINE SULFATE (PF) 4 MG/ML IV SOLN: 4.0000 mg | INTRAVENOUS | Status: DC | PRN

## 2022-12-15 MED ORDER — BUPIVACAINE-EPINEPHRINE 0.25% -1:200000 IJ SOLN
INTRAMUSCULAR | Status: DC | PRN
  Administered 2022-12-15: 30 mL

## 2022-12-15 MED ORDER — DIPHENHYDRAMINE HCL 12.5 MG/5ML PO ELIX: 12.5000 mg | ORAL_SOLUTION | Freq: Four times a day (QID) | ORAL | Status: DC | PRN

## 2022-12-15 MED ORDER — BUPIVACAINE LIPOSOME 1.3 % IJ SUSP
INTRAMUSCULAR | Status: DC | PRN
  Administered 2022-12-15: 20 mL

## 2022-12-15 MED ORDER — SUCCINYLCHOLINE CHLORIDE 200 MG/10ML IV SOSY
PREFILLED_SYRINGE | INTRAVENOUS | Status: AC
  Filled 2022-12-15: qty 10

## 2022-12-15 MED ORDER — PANTOPRAZOLE SODIUM 40 MG IV SOLR
40.0000 mg | Freq: Every day | INTRAVENOUS | Status: DC
  Administered 2022-12-15: 40 mg via INTRAVENOUS
  Filled 2022-12-15: qty 10

## 2022-12-15 MED ORDER — BUPIVACAINE HCL (PF) 0.25 % IJ SOLN
INTRAMUSCULAR | Status: AC
  Filled 2022-12-15: qty 30

## 2022-12-15 SURGICAL SUPPLY — 53 items
APPLIER CLIP 5 13 M/L LIGAMAX5 (MISCELLANEOUS)
BLADE CLIPPER SURG (BLADE) ×1 IMPLANT
CLIP APPLIE 5 13 M/L LIGAMAX5 (MISCELLANEOUS) IMPLANT
COVER LIGHT HANDLE STERIS (MISCELLANEOUS) IMPLANT
CUTTER FLEX LINEAR 45M (STAPLE) ×1 IMPLANT
DERMABOND ADVANCED .7 DNX12 (GAUZE/BANDAGES/DRESSINGS) ×1 IMPLANT
DRAPE INCISE IOBAN 66X45 STRL (DRAPES) IMPLANT
DRSG OPSITE POSTOP 3X4 (GAUZE/BANDAGES/DRESSINGS) IMPLANT
DRSG OPSITE POSTOP 4X8 (GAUZE/BANDAGES/DRESSINGS) IMPLANT
ELECT CAUTERY BLADE 6.4 (BLADE) ×1 IMPLANT
ELECT CAUTERY BLADE TIP 2.5 (TIP) ×1
ELECT REM PT RETURN 9FT ADLT (ELECTROSURGICAL) ×1
ELECTRODE CAUTERY BLDE TIP 2.5 (TIP) ×1 IMPLANT
ELECTRODE REM PT RTRN 9FT ADLT (ELECTROSURGICAL) ×1 IMPLANT
GLOVE BIO SURGEON STRL SZ7 (GLOVE) ×1 IMPLANT
GOWN STRL REUS W/ TWL LRG LVL3 (GOWN DISPOSABLE) ×2 IMPLANT
GOWN STRL REUS W/TWL LRG LVL3 (GOWN DISPOSABLE) ×2
IRRIGATION STRYKERFLOW (MISCELLANEOUS) ×1 IMPLANT
IRRIGATOR STRYKERFLOW (MISCELLANEOUS) ×1
IV NS 1000ML (IV SOLUTION) ×1
IV NS 1000ML BAXH (IV SOLUTION) ×1 IMPLANT
MANIFOLD NEPTUNE II (INSTRUMENTS) ×1 IMPLANT
NEEDLE HYPO 22GX1.5 SAFETY (NEEDLE) ×1 IMPLANT
NS IRRIG 500ML POUR BTL (IV SOLUTION) ×1 IMPLANT
PACK LAP CHOLECYSTECTOMY (MISCELLANEOUS) ×1 IMPLANT
PENCIL SMOKE EVACUATOR (MISCELLANEOUS) ×1 IMPLANT
RELOAD 45 VASCULAR/THIN (ENDOMECHANICALS) IMPLANT
RELOAD STAPLE 45 2.5 WHT GRN (ENDOMECHANICALS) ×1 IMPLANT
RELOAD STAPLE 45 3.5 BLU ETS (ENDOMECHANICALS) ×1 IMPLANT
RELOAD STAPLE TA45 3.5 REG BLU (ENDOMECHANICALS) ×5 IMPLANT
SCISSORS METZENBAUM CVD 33 (INSTRUMENTS) IMPLANT
SHEARS HARMONIC ACE PLUS 36CM (ENDOMECHANICALS) ×1 IMPLANT
SLEEVE Z-THREAD 5X100MM (TROCAR) ×1 IMPLANT
SPIKE FLUID TRANSFER (MISCELLANEOUS) IMPLANT
SPONGE T-LAP 18X18 ~~LOC~~+RFID (SPONGE) ×1 IMPLANT
STAPLER PROXIMATE 75MM BLUE (STAPLE) IMPLANT
STAPLER SKIN PROX 35W (STAPLE) IMPLANT
SUT MNCRL AB 4-0 PS2 18 (SUTURE) ×1 IMPLANT
SUT PDS AB 0 CT1 27 (SUTURE) IMPLANT
SUT SILK 2 0SH CR/8 30 (SUTURE) IMPLANT
SUT VICRYL 0 UR6 27IN ABS (SUTURE) ×2 IMPLANT
SYR 20ML LL LF (SYRINGE) ×1 IMPLANT
SYR TB 1ML 27GX1/2 LL (SYRINGE) IMPLANT
SYS BAG RETRIEVAL 10MM (BASKET) ×1
SYS LAPSCP GELPORT 120MM (MISCELLANEOUS) ×1
SYSTEM BAG RETRIEVAL 10MM (BASKET) ×1 IMPLANT
SYSTEM LAPSCP GELPORT 120MM (MISCELLANEOUS) IMPLANT
TRAP FLUID SMOKE EVACUATOR (MISCELLANEOUS) ×1 IMPLANT
TRAY FOLEY MTR SLVR 16FR STAT (SET/KITS/TRAYS/PACK) ×1 IMPLANT
TROCAR XCEL BLUNT TIP 100MML (ENDOMECHANICALS) ×1 IMPLANT
TROCAR XCEL NON-BLD 5MMX100MML (ENDOMECHANICALS) ×1 IMPLANT
TUBING EVAC SMOKE HEATED PNEUM (TUBING) ×1 IMPLANT
WATER STERILE IRR 500ML POUR (IV SOLUTION) ×1 IMPLANT

## 2022-12-15 NOTE — Transfer of Care (Signed)
Immediate Anesthesia Transfer of Care Note  Patient: RAYNISHA AVILLA  Procedure(s) Performed: LAPAROSCOPIC CECECTOMY  Patient Location: PACU  Anesthesia Type:General  Level of Consciousness: awake and alert   Airway & Oxygen Therapy: Patient Spontanous Breathing and Patient connected to face mask oxygen  Post-op Assessment: Report given to RN and Post -op Vital signs reviewed and stable  Post vital signs: Reviewed  Last Vitals:  Vitals Value Taken Time  BP    Temp    Pulse 79 12/15/22 2157  Resp    SpO2 100 % 12/15/22 2157  Vitals shown include unvalidated device data.  Last Pain:  Vitals:   12/15/22 1742  TempSrc: Oral  PainSc:          Complications: No notable events documented.

## 2022-12-15 NOTE — ED Notes (Signed)
Report given to [place], [place] RN. Paper consent signed by pt and physician while discussing procedure in room. All jewelry and clothing placed in pt belonging bag and given to family. Pt voiding at this time, receiving 2g Rocephin IV and Flagyl sent up with pt to OR

## 2022-12-15 NOTE — ED Provider Notes (Signed)
[facility] EMERGENCY DEPARTMENT Provider Note   CSN: [id number] Arrival date & time: 12/15/22  1726     History  Chief Complaint  Patient presents with   Abdominal Pain    Stephanie Koch is a 57 y.o. female.  Presents to the emergency department for evaluation of right lower quadrant abdominal pain that began yesterday.  Pain 6 out of 10 with movement.  She describes aching pain worse with movement along the right lower quadrant with mild nausea.  No vomiting or fevers.  Pain does radiate into the right lower back.  She denies any urinary symptoms.  She was seen by PCP today, underwent CT scan showing signs of early acute appendicitis.  She had blood work obtained but blood work has not resulted yet.  I have called the lab, results to be ready very soon.  Last ate around 12 to 1 PM.  She denies any past medical problems except for GERD.  No previous surgeries.  HPI     Home Medications Prior to Admission medications   Medication Sig Start Date End Date Taking? Authorizing Provider  Calcium Carbonate-Vit D-Min (CALCIUM 1200 PO) Take by mouth.    [provider]  cholecalciferol (VITAMIN D) 1000 units tablet Take 1,000 Units by mouth daily.    [provider]  fluticasone Asencion Islam) 50 MCG/ACT nasal spray Use 2 spray(s) in each nostril once daily 05/23/22   Delsa Grana, PA-C  pantoprazole (PROTONIX) 40 MG tablet Take 1 tablet (40 mg total) by mouth daily. 03/23/22   Teodora Medici, DO  rosuvastatin (CRESTOR) 5 MG tablet Take 1 tablet (5 mg total) by mouth daily. 03/23/22   Teodora Medici, DO  venlafaxine XR (EFFEXOR XR) 37.5 MG 24 hr capsule Take 1 capsule (37.5 mg total) by mouth daily with breakfast. 06/21/22   Delsa Grana, PA-C      Allergies    Patient has no known allergies.    Review of Systems   Review of Systems  Physical Exam Updated Vital Signs BP (!) 160/80 (BP Location: Left Arm)   Pulse 61   Temp 97.8 F (36.6 C)  (Oral)   Resp 16   Ht 5\' 4"  (1.626 m)   Wt 81.3 kg   LMP 11/13/2010   SpO2 97%   BMI 30.77 kg/m  Physical Exam Constitutional:      Appearance: She is well-developed.  HENT:     Head: Normocephalic and atraumatic.  Eyes:     Conjunctiva/sclera: Conjunctivae normal.  Cardiovascular:     Rate and Rhythm: Normal rate.     Heart sounds: Normal heart sounds.  Pulmonary:     Effort: Pulmonary effort is normal. No respiratory distress.  Abdominal:     General: Bowel sounds are normal. There is no distension. There are no signs of injury.     Palpations: Abdomen is soft.     Tenderness: There is abdominal tenderness in the right lower quadrant. There is guarding and rebound. There is no right CVA tenderness or left CVA tenderness. Positive signs include McBurney's sign.  Musculoskeletal:        General: Normal range of motion.     Cervical back: Normal range of motion.  Skin:    General: Skin is warm.     Findings: No rash.  Neurological:     Mental Status: She is alert and oriented to person, place, and time.  Psychiatric:        Behavior: Behavior normal.  Thought Content: Thought content normal.     ED Results / Procedures / Treatments   Labs (all labs ordered are listed, but only abnormal results are displayed) Labs Reviewed - No data to display  EKG None  Radiology CT Abdomen Pelvis W Contrast  Result Date: 12/15/2022 CLINICAL DATA:  Right lower quadrant abdominal pain and nausea EXAM: CT ABDOMEN AND PELVIS WITH CONTRAST TECHNIQUE: Multidetector CT imaging of the abdomen and pelvis was performed using the standard protocol following bolus administration of intravenous contrast. RADIATION DOSE REDUCTION: This exam was performed according to the departmental dose-optimization program which includes automated exposure control, adjustment of the mA and/or kV according to patient size and/or use of iterative reconstruction technique. CONTRAST:  175mL OMNIPAQUE IOHEXOL  300 MG/ML  SOLN COMPARISON:  None Available. FINDINGS: Lower chest: Right middle lobe subsegmental atelectasis. No pleural effusion or pneumothorax demonstrated. Partially imaged heart size is normal. Hepatobiliary: No focal hepatic lesions. No intra or extrahepatic biliary ductal dilation. Normal gallbladder. Pancreas: No focal lesions or main ductal dilation. Spleen: Normal in size without focal abnormality. Adrenals/Urinary Tract: No adrenal nodules. No suspicious renal mass, calculi or hydronephrosis. No focal bladder wall thickening. Stomach/Bowel: Small hiatal hernia. Normal appearance of the stomach. No evidence of bowel wall thickening, distention, or inflammatory changes. Appendiceal base measures up to 1.9 cm with mural thickening and subtle thickening of the adjacent right lower quadrant peritoneum. Vascular/Lymphatic: No significant vascular findings are present. No enlarged abdominal or pelvic lymph nodes. Reproductive: Simple appearing right ovarian cyst measures 5.7 cm. Other: Mild periappendiceal stranding.  No free fluid or free air. Musculoskeletal: No acute or abnormal lytic or blastic osseous lesions. IMPRESSION: 1. Appendiceal base measures up to 1.9 cm with mural thickening and subtle thickening of the adjacent right lower quadrant peritoneum, suspicious for early acute appendicitis. 2. Simple-appearing 5.7 cm right ovarian cyst. Consider follow-up pelvic ultrasound examination in 3-6 months to assess for resolution. These results will be called to the ordering clinician or representative by the Radiologist Assistant, and communication documented in the PACS or [employer]. Electronically Signed   By: Darrin Nipper M.D.   On: 12/15/2022 16:58    Procedures Procedures    Medications Ordered in ED Medications  cefTRIAXone (ROCEPHIN) 2 g in sodium chloride 0.9 % 100 mL IVPB (2 g Intravenous New Bag/Given 12/15/22 1943)  metroNIDAZOLE (FLAGYL) IVPB 500 mg ( Intravenous MAR Hold 12/15/22 2007)   Chlorhexidine Gluconate Cloth 2 % PADS 6 each ( Topical Automatically Held 12/24/22 0600)  morphine (PF) 4 MG/ML injection 4 mg (4 mg Intravenous Given 12/15/22 1857)  ondansetron (ZOFRAN) injection 4 mg (4 mg Intravenous Given 12/15/22 1857)  sodium chloride 0.9 % bolus 1,000 mL (0 mLs Intravenous Stopped 12/15/22 1940)  sodium chloride 0.9 % bolus 1,000 mL (1,000 mLs Intravenous New Bag/Given 12/15/22 1944)    ED Course/ Medical Decision Making/ A&P                           Medical Decision Making Risk Prescription drug management.   57 year old female with 1 day history of right lower quadrant abdominal pain and right lower back pain.  Pain worse with movement.  Labs and vital signs are stable.  CT scan showing early appendicitis.  Will start fluids, make NPO.  Will give pain medications and start IV antibiotics, Rocephin and Flagyl.  General surgery paged and will come down to see patient in the ED. Patient will go to  OR today.   Final Clinical Impression(s) / ED Diagnoses Final diagnoses:  Right lower quadrant abdominal pain  Acute appendicitis with localized peritonitis, without perforation, abscess, or gangrene    Rx / DC Orders ED Discharge Orders     None         Ronnette Juniper 12/15/22 2009    Sharyn Creamer, MD 12/15/22 2014

## 2022-12-15 NOTE — ED Notes (Signed)
Consent form signed.

## 2022-12-15 NOTE — Op Note (Signed)
laparascopic Partial cecectomy ( partial colectomy) Hand assisted  Stephanie Koch Date of operation:  12/15/2022  Indications: The patient presented with a history of  abdominal pain. Workup has revealed findings consistent with acute appendicitis.  Pre-operative Diagnosis: Acute appendicitis without mention of peritonitis  Post-operative Diagnosis: Same  Surgeon: [name], MD, FACS  Anesthesia: General with endotracheal tube  Findings: Acute appendicitis with very thick base, I needed to perform a cecetomy to have good viable margins of resection. Given the thickness I needed my hand to be able to have tactile feedback and evaluate an appropriate margin of resection  Estimated Blood Loss: 20cc         Specimens: partial cecum         Complications:  none  Procedure Details  The patient was seen again in the preop area. The options of surgery versus observation were reviewed with the patient and/or family. The risks of bleeding, infection, recurrence of symptoms, negative laparoscopy, potential for an open procedure, bowel injury, abscess or infection, were all reviewed as well. The patient was taken to Operating Room, identified as Stephanie Koch and the procedure verified as laparoscopic appendectomy. A Time Out was held and the above information confirmed.  The patient was placed in the supine position and general anesthesia was induced.  Antibiotic prophylaxis was administered and VT E prophylaxis was in place. A Foley catheter was placed by the nursing staff.   The abdomen was prepped and draped in a sterile fashion. An infraumbilical incision was made. A cutdown technique was used to enter the abdominal cavity. Two vicryl stitches were placed on the fascia and a Hasson trocar inserted. Pneumoperitoneum obtained. Two 5 mm ports were placed under direct visualization.   The appendix was identified and found to be acutely inflamed it was severely inflamed and it took me a while  to dissect the base of the appendix due to inflammatory response. I felt that the base was thick and that I could easily miss a mass with a laparoscopic approach. At this time I converted to hand assisted technique, enlarged my incision to allow a gelport ( 7cms incision) Gelport was placed.  The appendix was carefully dissected. The mesoappendix was divided with Harmonic scalpel. I felt that the tactile feedback was invaluable and worth it.  Given that the base was involved I needed to obtain a good 1 cm margin of healthy cecum around. Multiple 45 mm blue loads staples were used to complete a cecectomy. Cavity was  irrigated with  normal saline which was aspirated. Inspection  failed to identify any additional bleeding and there were no signs of bowel injury. Again the right lower quadrant was inspected there was no sign of bleeding or bowel injury therefore pneumoperitoneum was released, all ports were removed. The fascia was closed with 0 PDS using small bite technique.  Exparel was used to inject of both side of the abdominal wall as a TAP block. Staples used to close the skin and sterile dressing applied/.  The patient tolerated the procedure well, there were no complications. The sponge lap and needle count were correct at the end of the procedure.  The patient was taken to the recovery room in stable condition to be admitted for continued care.    [name], MD FACS

## 2022-12-15 NOTE — Progress Notes (Addendum)
BP 130/72   Pulse 68   Temp 98.3 F (36.8 C) (Oral)   Resp 16   Ht 5\' 4"  (1.626 m)   Wt 179 lb 4.8 oz (81.3 kg)   LMP 11/13/2010   SpO2 98%   BMI 30.78 kg/m    Subjective:    Patient ID: Stephanie Koch, female    DOB: 06/25/1966, 57 y.o.   MRN: [id number]  HPI: Stephanie Koch is a 57 y.o. female  Chief Complaint  Patient presents with   Back Pain    Low right front side near groin area and low back right started yesterday pt unsure   Right lower back pain/right lower quadrant pain: she says that the pain started yesterday.  She denies any urinary frequency, urgency and no dysuria.  Patient describes the pain as sharp and achy at times.  During examine patient came off the table during palpation of the right lower quadrant.  She denies any fever but has had some nausea. She has taken tylenol for pain and it takes the edge off.  Will get stat labs and ct scan.  Urine dip was positive for leukocytes and blood.  Will also send urine for culture.    Relevant past medical, surgical, family and social history reviewed and updated as indicated. Interim medical history since our last visit reviewed. Allergies and medications reviewed and updated.  Review of Systems  Constitutional: Negative for fever or weight change.  Respiratory: Negative for cough and shortness of breath.   Cardiovascular: Negative for chest pain or palpitations.  Gastrointestinal: positive for abdominal pain, no bowel changes.  Musculoskeletal: Negative for gait problem or joint swelling. Positive for right lower back pain Skin: Negative for rash.  Neurological: Negative for dizziness or headache.  No other specific complaints in a complete review of systems (except as listed in HPI above).      Objective:    BP 130/72   Pulse 68   Temp 98.3 F (36.8 C) (Oral)   Resp 16   Ht 5\' 4"  (1.626 m)   Wt 179 lb 4.8 oz (81.3 kg)   LMP 11/13/2010   SpO2 98%   BMI 30.78 kg/m   Wt Readings from Last 3  Encounters:  12/15/22 179 lb 4.8 oz (81.3 kg)  03/23/22 179 lb 1.6 oz (81.2 kg)  03/30/21 175 lb (79.4 kg)    Physical Exam  Constitutional: Patient appears well-developed and well-nourished. Obese  No distress.  HEENT: head atraumatic, normocephalic, pupils equal and reactive to light, neck supple Cardiovascular: Normal rate, regular rhythm and normal heart sounds.  No murmur heard. No BLE edema. Pulmonary/Chest: Effort normal and breath sounds normal. No respiratory distress. Abdominal: Soft.  Tenderness noted to RLQ, no CVA tenderness Psychiatric: Patient has a normal mood and affect. behavior is normal. Judgment and thought content normal.  Results for orders placed or performed in visit on 12/15/22  POCT urinalysis dipstick  Result Value Ref Range   Color, UA Yellow    Clarity, UA Cloudy    Glucose, UA Negative Negative   Bilirubin, UA Negative    Ketones, UA Negative    Spec Grav, UA 1.010 1.010 - 1.025   Blood, UA Moderate    pH, UA 5.0 5.0 - 8.0   Protein, UA Negative Negative   Urobilinogen, UA 0.2 0.2 or 1.0 E.U./dL   Nitrite, UA Negative    Leukocytes, UA Moderate (2+) (A) Negative   Appearance yellow    Odor Foul  Assessment & Plan:   Problem List Items Addressed This Visit   None Visit Diagnoses     Right lower quadrant abdominal pain    -  Primary   patient rebound tenderness noted, reports nausea and rlq abdominal pain started yesterday, will get stat labs, ct and send urine for culture   Relevant Orders   CBC with Differential/Platelet   Comprehensive metabolic panel   CT Abdomen Pelvis W Contrast   Urine Culture   Acute right-sided low back pain without sciatica       patient reports sharp right side lower back pain,  denies any urinary frequency urgency or dysuria, will send urine for cutlure, stat ct and labs   Relevant Orders   POCT urinalysis dipstick (Completed)   Urine Culture        Follow up plan: Return if symptoms worsen or fail  to improve.

## 2022-12-15 NOTE — Addendum Note (Signed)
Addended by: Serafina Royals F on: 12/15/2022 03:25 PM   Modules accepted: Orders

## 2022-12-15 NOTE — H&P (Signed)
Patient ID: Stephanie Koch, female   DOB: April 03, 1966, 57 y.o.   MRN: [id number]  HPI Stephanie Koch is a 57 y.o. female seen for abdominal pain.  She reports that the pain is started yesterday and is located in the right lower quadrant.  Pain is moderate in intensity and worsening with certain movements.  Pain is also sharp.  He does endorse decreased appetite as well as nausea.  Appropriate workup included CT scan of the abdomen pelvis that I personally reviewed showing evidence of acute appendicitis without abscess no perforation. Labs show hemoglobin of 11.3 that is baseline. No Prior abdominal operations.  She is able to perform more than 4 METS of activity without any shortness of breath or chest pain.   HPI  Past Medical History:  Diagnosis Date   Abnormal Pap smear of cervix 08/2013   ASCUS pap with HR HPV Neg. Needs repeat pap with cotesting 2017.   Allergy    Anxiety    Barrett's esophagus 07/2010   EGD with biopsy confirmed   Hyperlipidemia    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 09/12/2013   HPV negative; repeat April 2017 NIL, negative HPV    Past Surgical History:  Procedure Laterality Date   COLONOSCOPY WITH PROPOFOL N/A 03/06/2020   Procedure: COLONOSCOPY WITH PROPOFOL;  Surgeon: Jonathon Bellows, MD;  Location: [facility] ENDOSCOPY;  Service: Gastroenterology;  Laterality: N/A;   ESOPHAGOGASTRODUODENOSCOPY (EGD) WITH PROPOFOL N/A 03/06/2020   Procedure: ESOPHAGOGASTRODUODENOSCOPY (EGD) WITH PROPOFOL;  Surgeon: Jonathon Bellows, MD;  Location: [facility] ENDOSCOPY;  Service: Gastroenterology;  Laterality: N/A;    Family History  Problem Relation Age of Onset   Diabetes Mother    Hypertension Mother    Hyperlipidemia Mother    Stroke Father    Hypertension Father    Hyperlipidemia Father    Cancer Paternal Uncle        bowel   Diabetes Maternal Grandmother    Heart disease Maternal Grandmother    Congestive Heart Failure Maternal Grandmother    Cancer Maternal  Grandfather        lung   Stroke Paternal Grandfather    Emphysema Paternal Grandmother    COPD Neg Hx     Social History Social History   Tobacco Use   Smoking status: Never   Smokeless tobacco: Never  Vaping Use   Vaping Use: Never used  Substance Use Topics   Alcohol use: No   Drug use: No    No Known Allergies  Current Facility-Administered Medications  Medication Dose Route Frequency Provider Last Rate Last Admin   cefTRIAXone (ROCEPHIN) 2 g in sodium chloride 0.9 % 100 mL IVPB  2 g Intravenous Once Gaines, Thomas C, PA-C       [START ON 12/16/2022] Chlorhexidine Gluconate Cloth 2 % PADS 6 each  6 each Topical Q0600 Brindley Madarang F, MD       metroNIDAZOLE (FLAGYL) IVPB 500 mg  500 mg Intravenous Once Gaines, Thomas C, PA-C       sodium chloride 0.9 % bolus 1,000 mL  1,000 mL Intravenous Once Tya Haughey F, MD       Current Outpatient Medications  Medication Sig Dispense Refill   Calcium Carbonate-Vit D-Min (CALCIUM 1200 PO) Take by mouth.     cholecalciferol (VITAMIN D) 1000 units tablet Take 1,000 Units by mouth daily.     fluticasone (FLONASE) 50 MCG/ACT nasal spray Use 2 spray(s) in each nostril once daily 48 g 0   pantoprazole (  PROTONIX) 40 MG tablet Take 1 tablet (40 mg total) by mouth daily. 90 tablet 3   rosuvastatin (CRESTOR) 5 MG tablet Take 1 tablet (5 mg total) by mouth daily. 90 tablet 3   venlafaxine XR (EFFEXOR XR) 37.5 MG 24 hr capsule Take 1 capsule (37.5 mg total) by mouth daily with breakfast. 90 capsule 3     Review of Systems Full ROS  was asked and was negative except for the information on the HPI  Physical Exam Blood pressure (!) 160/80, pulse 61, temperature 97.8 F (36.6 C), temperature source Oral, resp. rate 16, height 5\' 4"  (1.626 m), weight 81.3 kg, last menstrual period 11/13/2010, SpO2 97 %. CONSTITUTIONAL: NAD. EYES: Pupils are equal, round, , Sclera are non-icteric. EARS, NOSE, MOUTH AND THROAT: The oropharynx is clear. The oral  mucosa is pink and moist. Hearing is intact to voice. LYMPH NODES:  Lymph nodes in the neck are normal. RESPIRATORY:  Lungs are clear. There is normal respiratory effort, with equal breath sounds bilaterally, and without pathologic use of accessory muscles. CARDIOVASCULAR: Heart is regular without murmurs, gallops, or rubs. GI: The abdomen is  soft, TP RLQ w rebound and Point Tenderness Mcburney's point.  There are no palpable masses. There is no hepatosplenomegaly. There are normal bowel sounds . GU: Rectal deferred.   MUSCULOSKELETAL: Normal muscle strength and tone. No cyanosis or edema.   SKIN: Turgor is good and there are no pathologic skin lesions or ulcers. NEUROLOGIC: Motor and sensation is grossly normal. Cranial nerves are grossly intact. PSYCH:  Oriented to person, place and time. Affect is normal.  Data Reviewed  I have personally reviewed the patient's imaging, laboratory findings and medical records.    Assessment/Plan 57 year old female presents with classic signs and symptoms consistent with appendicitis.  Discussed with the patient in detail about her disease process and options.  I do recommend laparoscopic appendectomy.  Will start broad-spectrum antibiotics IV fluids and we will do her surgery tonight pending or availability.  Procedure discussed with her in detail.  Risk, benefits and possible complications including but not limited to: Bleeding, infection injury to adjacent structures to include bowel, chronic pain and hernias.  She understands and wished to proceed.  Please note that I spent 75 minutes in this encounter including personally reviewing imaging studies, coordinating her care, counseling the patient, placing orders and performing appropriate documentation.   [name], MD FACS General Surgeon 12/15/2022, 7:43 PM

## 2022-12-15 NOTE — Anesthesia Procedure Notes (Signed)
Procedure Name: Intubation Date/Time: 12/15/2022 8:18 PM  Performed by: Rolla Plate, CRNAPre-anesthesia Checklist: Patient identified, Patient being monitored, Timeout performed, Emergency Drugs available and Suction available Patient Re-evaluated:Patient Re-evaluated prior to induction Oxygen Delivery Method: Circle system utilized Preoxygenation: Pre-oxygenation with 100% oxygen Induction Type: IV induction and Rapid sequence Laryngoscope Size: Mac and 3 Grade View: Grade I Tube type: Oral Tube size: 6.5 mm Number of attempts: 1 Airway Equipment and Method: Stylet and Video-laryngoscopy Placement Confirmation: ETT inserted through vocal cords under direct vision, positive ETCO2 and breath sounds checked- equal and bilateral Secured at: 20 cm Tube secured with: Tape Dental Injury: Teeth and Oropharynx as per pre-operative assessment

## 2022-12-15 NOTE — Anesthesia Preprocedure Evaluation (Signed)
Anesthesia Evaluation  Patient identified by MRN, date of birth, ID band Patient awake    Reviewed: Allergy & Precautions, NPO status , Patient's Chart, lab work & pertinent test results  History of Anesthesia Complications Negative for: history of anesthetic complications  Airway Mallampati: IV  TM Distance: >3 FB Neck ROM: full    Dental  (+) Dental Advidsory Given, Teeth Intact   Pulmonary neg pulmonary ROS, neg sleep apnea, neg COPD   Pulmonary exam normal        Cardiovascular (-) hypertension(-) angina (-) Past MI negative cardio ROS Normal cardiovascular exam     Neuro/Psych  PSYCHIATRIC DISORDERS Anxiety     negative neurological ROS     GI/Hepatic negative GI ROS, Neg liver ROS,,,  Endo/Other  negative endocrine ROS    Renal/GU      Musculoskeletal   Abdominal   Peds  Hematology negative hematology ROS (+)   Anesthesia Other Findings Past Medical History: 08/2013: Abnormal Pap smear of cervix     Comment:  ASCUS pap with HR HPV Neg. Needs repeat pap with               cotesting 2017. No date: Allergy No date: Anxiety 07/2010: Barrett's esophagus     Comment:  EGD with biopsy confirmed No date: Hyperlipidemia 09/12/2013: 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     Comment:  HPV negative; repeat April 2017 NIL, negative HPV  Past Surgical History: 03/06/2020: COLONOSCOPY WITH PROPOFOL; N/A     Comment:  Procedure: COLONOSCOPY WITH PROPOFOL;  Surgeon: Jonathon Bellows, MD;  Location: [facility] ENDOSCOPY;  Service:               Gastroenterology;  Laterality: N/A; 03/06/2020: ESOPHAGOGASTRODUODENOSCOPY (EGD) WITH PROPOFOL; N/A     Comment:  Procedure: ESOPHAGOGASTRODUODENOSCOPY (EGD) WITH               PROPOFOL;  Surgeon: Jonathon Bellows, MD;  Location: [facility]               ENDOSCOPY;  Service: Gastroenterology;  Laterality: N/A;  BMI    Body Mass Index: 30.77 kg/m       Reproductive/Obstetrics negative OB ROS                             Anesthesia Physical Anesthesia Plan  ASA: 2  Anesthesia Plan: General ETT   Post-op Pain Management:    Induction: Intravenous  PONV Risk Score and Plan: 4 or greater and Ondansetron, Dexamethasone and Midazolam  Airway Management Planned: Oral ETT  Additional Equipment:   Intra-op Plan:   Post-operative Plan: Extubation in OR  Informed Consent: I have reviewed the patients History and Physical, chart, labs and discussed the procedure including the risks, benefits and alternatives for the proposed anesthesia with the patient or authorized representative who has indicated his/her understanding and acceptance.     Dental Advisory Given  Plan Discussed with: Anesthesiologist, CRNA and Surgeon  Anesthesia Plan Comments: (Patient consented for risks of anesthesia including but not limited to:  - adverse reactions to medications - damage to eyes, teeth, lips or other oral mucosa - nerve damage due to positioning  - sore throat or hoarseness - Damage to heart, brain, nerves, lungs, other parts of body or loss of life  Patient voiced understanding.)       Anesthesia Quick  Evaluation

## 2022-12-15 NOTE — ED Triage Notes (Signed)
CT scan and blood work done today outpatient, patient has acute appendicitis.

## 2022-12-16 ENCOUNTER — Encounter: Payer: Self-pay | Admitting: Surgery

## 2022-12-16 LAB — BASIC METABOLIC PANEL
Anion gap: 8 (ref 5–15)
BUN: 10 mg/dL (ref 6–20)
CO2: 22 mmol/L (ref 22–32)
Calcium: 7.9 mg/dL — ABNORMAL LOW (ref 8.9–10.3)
Chloride: 107 mmol/L (ref 98–111)
Creatinine, Ser: 0.78 mg/dL (ref 0.44–1.00)
GFR, Estimated: >60 mL/min (ref >60)
Glucose, Bld: 186 mg/dL — ABNORMAL HIGH (ref 70–99)
Potassium: 3.9 mmol/L (ref 3.5–5.1)
Sodium: 137 mmol/L (ref 135–145)

## 2022-12-16 LAB — CBC
HCT: 33.4 % — ABNORMAL LOW (ref 36.0–46.0)
Hemoglobin: 11 g/dL — ABNORMAL LOW (ref 12.0–15.0)
MCH: 31.3 pg (ref 26.0–34.0)
MCHC: 32.9 g/dL (ref 30.0–36.0)
MCV: 94.9 fL (ref 80.0–100.0)
Platelets: 233 10*3/uL (ref 150–400)
RBC: 3.52 MIL/uL — ABNORMAL LOW (ref 3.87–5.11)
RDW: 12.6 % (ref 11.5–15.5)
WBC: 13 10*3/uL — ABNORMAL HIGH (ref 4.0–10.5)
nRBC: 0 % (ref 0.0–0.2)

## 2022-12-16 LAB — HIV ANTIBODY (ROUTINE TESTING W REFLEX): HIV Screen 4th Generation wRfx: NONREACTIVE

## 2022-12-16 MED ORDER — METRONIDAZOLE 500 MG/100ML IV SOLN
500.0000 mg | Freq: Once | INTRAVENOUS | Status: AC
  Administered 2022-12-16: 500 mg via INTRAVENOUS
  Filled 2022-12-16: qty 100

## 2022-12-16 MED ORDER — SODIUM CHLORIDE 0.9 % IV SOLN
2.0000 g | Freq: Once | INTRAVENOUS | Status: AC
  Administered 2022-12-16: 2 g via INTRAVENOUS
  Filled 2022-12-16: qty 2

## 2022-12-16 MED ORDER — HYDROCODONE-ACETAMINOPHEN 5-325 MG PO TABS: 1.0000 | ORAL_TABLET | ORAL | 0 refills | Status: DC | PRN

## 2022-12-16 NOTE — Plan of Care (Signed)
  Problem: Activity: Goal: Risk for activity intolerance will decrease Outcome: Progressing   Problem: Coping: Goal: Level of anxiety will decrease Outcome: Progressing   Problem: Pain Managment: Goal: General experience of comfort will improve Outcome: Progressing   Problem: Safety: Goal: Ability to remain free from injury will improve Outcome: Progressing   

## 2022-12-16 NOTE — TOC Initial Note (Signed)
Transition of Care [facility]) - Initial/Assessment Note    Patient Details  Name: Stephanie Koch MRN: [id number] Date of Birth: 25-Oct-1966  Transition of Care [facility]) CM/SW Contact:    Beverly Sessions, RN Phone Number: 12/16/2022, 8:57 AM  Clinical Narrative:                     Transition of Care [facility]) Screening Note   Patient Details  Name: Stephanie Koch Date of Birth: [birth year]   Transition of Care [facility]) CM/SW Contact:    Beverly Sessions, RN Phone Number: 12/16/2022, 8:58 AM    Transition of Care Department [facility]) has reviewed patient and no TOC needs have been identified at this time. We will continue to monitor patient advancement through interdisciplinary progression rounds. If new patient transition needs arise, please place a TOC consult.       Patient Goals and CMS Choice            Expected Discharge Plan and Services         Expected Discharge Date: 12/16/22                                    Prior Living Arrangements/Services                       Activities of Daily Living Home Assistive Devices/Equipment: None ADL Screening (condition at time of admission) Patient's cognitive ability adequate to safely complete daily activities?: Yes Is the patient deaf or have difficulty hearing?: No Does the patient have difficulty seeing, even when wearing glasses/contacts?: No Does the patient have difficulty concentrating, remembering, or making decisions?: No Patient able to express need for assistance with ADLs?: Yes Does the patient have difficulty dressing or bathing?: No Independently performs ADLs?: No Communication: Independent Dressing (OT): Independent Grooming: Independent Feeding: Independent Bathing: Independent Toileting: Independent In/Out Bed: Independent Walks in Home: Independent Does the patient have difficulty walking or climbing stairs?: No Weakness of Legs: None Weakness of Arms/Hands: None  Permission  Sought/Granted                  Emotional Assessment              Admission diagnosis:  Right lower quadrant abdominal pain [R10.31] Acute appendicitis with localized peritonitis, without perforation, abscess, or gangrene [K35.30] Appendicitis [K37] Patient Active Problem List   Diagnosis Date Noted   Right lower quadrant abdominal pain 12/15/2022   Acute appendicitis with localized peritonitis, without perforation, abscess, or gangrene 12/15/2022   Appendicitis 12/15/2022   Pre-diabetes 03/23/2022   Gastroesophageal reflux disease without esophagitis 03/23/2022   Seasonal allergic rhinitis 05/26/2015   Mixed hyperlipidemia 05/26/2015   Anxiety state 05/26/2015   Menopausal state 05/26/2015   Overweight (BMI 25.0-29.9) 05/26/2015   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 09/12/2013   PCP:  Delsa Grana, PA-C Pharmacy:   [facility] [address] [place] [address] Phone: [phone] Fax: [phone]     Social Determinants of Health (SDOH) Social History: SDOH Screenings   Food Insecurity: No Food Insecurity (12/16/2022)  Housing: Low Risk  (12/16/2022)  Transportation Needs: No Transportation Needs (12/16/2022)  Utilities: Not At Risk (12/16/2022)  Alcohol Screen: Low Risk  (01/20/2021)  Depression (PHQ2-9): Low Risk  (12/15/2022)  Financial Resource Strain: Low Risk  (03/23/2022)  Physical  Activity: Insufficiently Active (03/23/2022)  Social Connections: Moderately Isolated (03/23/2022)  Stress: No Stress Concern Present (03/23/2022)  Tobacco Use: Low Risk  (12/16/2022)   SDOH Interventions:     Readmission Risk Interventions     No data to display

## 2022-12-16 NOTE — Anesthesia Postprocedure Evaluation (Signed)
Anesthesia Post Note  Patient: Stephanie Koch  Procedure(s) Performed: LAPAROSCOPIC CECECTOMY  Patient location during evaluation: PACU Anesthesia Type: General Level of consciousness: awake and alert Pain management: pain level controlled Vital Signs Assessment: post-procedure vital signs reviewed and stable Respiratory status: spontaneous breathing, nonlabored ventilation, respiratory function stable and patient connected to nasal cannula oxygen Cardiovascular status: blood pressure returned to baseline and stable Postop Assessment: no apparent nausea or vomiting Anesthetic complications: no  No notable events documented.   Last Vitals:  Vitals:   12/15/22 2311 12/16/22 0403  BP: 119/71 125/75  Pulse: 61 72  Resp: 18 16  Temp: (!) 36.4 C   SpO2: 95% 100%    Last Pain:  Vitals:   12/15/22 2311  TempSrc: Oral  PainSc: Red Level

## 2022-12-16 NOTE — Discharge Instructions (Signed)
Laparoscopic Appendectomy, Adult, Care After What can I expect after the procedure? After a laparoscopic appendectomy, it is common to have: Little energy for normal activities. Mild pain in the area where the cuts from surgery (incisions) were made. Trouble pooping (constipation). This may be caused by: Pain medicine. A lack of activity. Gas pain that can spread to your shoulders. Follow these instructions at home: Your doctor may give you more instructions. If you have problems, contact your doctor. Medicines Take over-the-counter and prescription medicines only as told by your doctor. Do not stop taking antibiotics even if you start to feel better. Take pain medicines before your pain gets very bad. If told, take steps to prevent problems with pooping (constipation). You may need to: Drink enough fluid to keep your pee (urine) pale yellow. Take medicines. You will be told what medicines to take. Eat foods that are high in fiber. These include beans, whole grains, and fresh fruits and vegetables. Limit foods that are high in fat and sugar. These include fried or sweet foods. Ask your doctor if you should avoid driving or using machines while you are taking your medicine. Incision care  Follow instructions from your doctor about how to take care of your incisions. Make sure you: Wash your hands with soap and water for at least 20 seconds before and after you change your bandage. If you cannot use soap and water, use hand sanitizer. Change your bandage. Leave stitches, staples, or skin glue in place for at least 2 weeks. Leave tape strips alone unless you are told to take them off. You may trim the edges of the tape strips if they curl up. Check your incisions every day for signs of infection. Check for: Redness, swelling, or pain. Fluid or blood. Warmth. Pus or a bad smell. Bathing Do not take baths, swim, or use a hot tub. Ask your doctor about taking showers or sponge  baths. Keep your incisions clean and dry. Clean them as told by your doctor. To do this: Gently wash the incisions with soap and water. Rinse the incisions with water to get all the soap off. Pat the incisions dry with a clean towel. Do not rub the incisions. Activity  If you were given a sedative during your procedure, do not drive or use machines until your doctor says that it is safe. A sedative is a medicine that helps you relax. Rest as told by your doctor. Do not lift anything that is heavier than 10 lb (4.5 kg). Do not play contact sports until your doctor says that it is safe. Return to your normal activities when your doctor says that it is safe. General instructions If you were sent home with a drain, follow instructions from your doctor on how to care for it. Take deep breaths. This helps to keep your lungs from getting an infection (pneumonia). If you need to cough or sneeze, place a pillow or blanket on your belly before you do so. This will help you control the pain. Keep all follow-up visits. Contact a doctor if: You have any of these signs of infection in an incision: Redness, swelling, or pain. Fluid or blood. Warmth. Pus or a bad smell. Your incisions open after the stitches have been taken out. You lose your appetite. You feel as if you may vomit. You vomit. You get watery poop (diarrhea), or you cannot control your poop. You get a rash. Get help right away if: You have a fever. You have trouble breathing.   You have sharp pains in your chest. You have swelling or pain in your legs. You faint. These symptoms may be an emergency. Get help right away. Call 911. Do not wait to see if the symptoms will go away. Do not drive yourself to the hospital. Summary After the procedure, it is common to have low energy, mild pain, trouble pooping, and gas pain. Follow your doctor's instructions about caring for yourself after the procedure. Rest after the procedure. Return  to your normal activities as told by your doctor. Contact your doctor if you see signs of infection around your incisions. Get help right away if you have a fever, chest pain, or trouble breathing. This information is not intended to replace advice given to you by your health care provider. Make sure you discuss any questions you have with your health care provider. Document Revised: 09/09/2021 Document Reviewed: 09/09/2021 Elsevier Patient Education  2023 Elsevier Inc.     

## 2022-12-16 NOTE — Discharge Summary (Signed)
Patient ID: LIANNE CARRETO MRN: [id number] DOB/AGE: [birth year] 56 y.o.  Admit date: 12/15/2022 Discharge date: 12/16/2022   Discharge Diagnoses:  Principal Problem:   Appendicitis Active Problems:   Right lower quadrant abdominal pain   Acute appendicitis with localized peritonitis, without perforation, abscess, or gangrene   Procedures:HALS partial cecectomy  Hospital Course:  57 year old female admitted with findings consistent with acute enthesitis and  was taken promptly to the operating room for an uneventful laparoscopic partial cecectomy .  Patient was kept overnight.  The time of discharge the patient was ambulating,  pain was controlled.  Her vital signs were stable and she was afebrile.   physical exam at discharge showed a pt  in no acute distress.  Awake and alert.  Abdomen: Soft incisions healing well without infection or peritonitis.  Extremities well-perfused and no edema.  Condition of the patient the time of discharge was stable   Disposition: Discharge disposition: 01-Home or Self Care       Discharge Instructions     Call MD for:  difficulty breathing, headache or visual disturbances   Complete by: As directed    Call MD for:  extreme fatigue   Complete by: As directed    Call MD for:  hives   Complete by: As directed    Call MD for:  persistant dizziness or light-headedness   Complete by: As directed    Call MD for:  persistant nausea and vomiting   Complete by: As directed    Call MD for:  redness, tenderness, or signs of infection (pain, swelling, redness, odor or green/yellow discharge around incision site)   Complete by: As directed    Call MD for:  severe uncontrolled pain   Complete by: As directed    Call MD for:  temperature >100.4   Complete by: As directed    Diet - low sodium heart healthy   Complete by: As directed    Discharge instructions   Complete by: As directed    Shower 24 hrs, Pt needs to be out of work for 2 weeks given recent  surgery. May use ibuprofen 800mg  OTC ( 4 tabs) q 8 hrs prn pain x 1 week   Increase activity slowly   Complete by: As directed    Lifting restrictions   Complete by: As directed    20 lbs x 6 wks   Remove dressing in 48 hours   Complete by: As directed       Allergies as of 12/16/2022   No Known Allergies      Medication List     TAKE these medications    CALCIUM 1200 PO Take by mouth.   cholecalciferol 1000 units tablet Commonly known as: VITAMIN D Take 1,000 Units by mouth daily.   fluticasone 50 MCG/ACT nasal spray Commonly known as: FLONASE Use 2 spray(s) in each nostril once daily   HYDROcodone-acetaminophen 5-325 MG tablet Commonly known as: NORCO/VICODIN Take 1-2 tablets by mouth every 4 (four) hours as needed for moderate pain.   pantoprazole 40 MG tablet Commonly known as: PROTONIX Take 1 tablet (40 mg total) by mouth daily.   rosuvastatin 5 MG tablet Commonly known as: CRESTOR Take 1 tablet (5 mg total) by mouth daily.   venlafaxine XR 37.5 MG 24 hr capsule Commonly known as: Effexor XR Take 1 capsule (37.5 mg total) by mouth daily with breakfast.        Follow-up Information     Tylene Fantasia, PA-C. Go  on 12/27/2022.   Specialty: Physician Assistant Why: appt. 1/16 at 2:30pm Contact information: [address] [place] [phone]                  [name], MD FACS

## 2022-12-17 LAB — URINE CULTURE
MICRO NUMBER:: 14388909
SPECIMEN QUALITY:: ADEQUATE

## 2022-12-19 LAB — SURGICAL PATHOLOGY

## 2022-12-26 ENCOUNTER — Telehealth: Payer: Self-pay | Admitting: *Deleted

## 2022-12-26 NOTE — Telephone Encounter (Signed)
Called patient and was unable to leave voicemail. Please let patient know that her FMLA is up front and ready to be picked up.

## 2022-12-27 ENCOUNTER — Ambulatory Visit (INDEPENDENT_AMBULATORY_CARE_PROVIDER_SITE_OTHER): Payer: BC Managed Care – PPO | Admitting: Physician Assistant

## 2022-12-27 ENCOUNTER — Encounter: Payer: Self-pay | Admitting: Physician Assistant

## 2022-12-27 VITALS — BP 113/77 | HR 61 | Temp 98.2°F | Ht 64.0 in | Wt 175.0 lb

## 2022-12-27 DIAGNOSIS — Z09 Encounter for follow-up examination after completed treatment for conditions other than malignant neoplasm: Secondary | ICD-10-CM

## 2022-12-27 DIAGNOSIS — K353 Acute appendicitis with localized peritonitis, without perforation or gangrene: Secondary | ICD-10-CM

## 2022-12-27 NOTE — Progress Notes (Signed)
[facility] POST-OP OFFICE VISIT  12/27/2022  HPI: Stephanie Koch is a 57 y.o. female 12 days s/p hand assisted laparoscopic cecectomy for acute appendicitis with significant inflammatory response with Dr Dahlia Byes   Overall doing well Umbilical soreness; improving; worse with sitting up No fever, chills, nausea, emesis Tolerating PO; normal bowel function Incisions are healing well; some irritation from staples  No other complaints   Vital signs: BP 113/77   Pulse 61   Temp 98.2 F (36.8 C)   Ht 5\' 4"  (1.626 m)   Wt 175 lb (79.4 kg)   LMP 11/13/2010   SpO2 97%   BMI 30.04 kg/m    Physical Exam: Constitutional: Well appearing female, NAD Abdomen: Soft, mild umbilical soreness, non-distended, no rebound/guarding Skin: Laparoscopic incisions are healing well with staples (removed), no erythema or drainage   Assessment/Plan: This is a 57 y.o. female 12 days s/p hand assisted laparoscopic cecectomy for acute appendicitis with significant inflammatory response with Dr Dahlia Byes   - Pain control prn  - Reviewed wound care recommendation; Staples removed; steri-strips placed  - Reviewed lifting restrictions; 6 weeks total  - Reviewed surgical pathology; Appendicitis   - She will follow up in ~4 weeks; She understands to call with questions/concerns in the interim   -- Edison Simon, PA-C [facility] 12/27/2022, 2:44 PM M-F: 7am - 4pm

## 2022-12-27 NOTE — Patient Instructions (Addendum)
We have placed steri strips over your wounds. These will fall off on their own in about a week. You may shower as usual. No submerging in water until all of your wounds are healed.   Follow up here in 1 month.    GENERAL POST-OPERATIVE PATIENT INSTRUCTIONS   WOUND CARE INSTRUCTIONS:  Try to keep the wound dry and avoid ointments on the wound unless directed to do so.  If the wound becomes bright red and painful or starts to drain infected material that is not clear, please contact your physician immediately.  If the wound is mildly pink and has a thick firm ridge underneath it, this is normal, and is referred to as a healing ridge.  This will resolve over the next 4-6 weeks.  BATHING: You may shower if you have been informed of this by your surgeon. However, Please do not submerge in a tub, hot tub, or pool until incisions are completely sealed or have been told by your surgeon that you may do so.  DIET:  You may eat any foods that you can tolerate.  It is a good idea to eat a high fiber diet and take in plenty of fluids to prevent constipation.  If you do become constipated you may want to take a mild laxative or take ducolax tablets on a daily basis until your bowel habits are regular.  Constipation can be very uncomfortable, along with straining, after recent surgery.  ACTIVITY: You may want to hug a pillow when coughing and sneezing to add additional support to the surgical area, if you had abdominal or chest surgery, which will decrease pain during these times.  You are encouraged to walk and engage in light activity for the next two weeks.  You should not lift more than 20 pounds for 6 weeks total after surgery as it could put you at increased risk for complications.  Twenty pounds is roughly equivalent to a plastic bag of groceries. At that time- Listen to your body when lifting, if you have pain when lifting, stop and then try again in a few days. Soreness after doing exercises or activities  of daily living is normal as you get back in to your normal routine.  MEDICATIONS:  Try to take narcotic medications and anti-inflammatory medications, such as tylenol, ibuprofen, naprosyn, etc., with food.  This will minimize stomach upset from the medication.  Should you develop nausea and vomiting from the pain medication, or develop a rash, please discontinue the medication and contact your physician.  You should not drive, make important decisions, or operate machinery when taking narcotic pain medication.  SUNBLOCK Use sun block to incision area over the next year if this area will be exposed to sun. This helps decrease scarring and will allow you avoid a permanent darkened area over your incision.  QUESTIONS:  Please feel free to call our office if you have any questions, and we will be glad to assist you. [phone]

## 2022-12-28 ENCOUNTER — Telehealth: Payer: Self-pay | Admitting: *Deleted

## 2022-12-28 NOTE — Telephone Encounter (Signed)
Patient would like to come pick up a return to work note- she wants a return to work on 01/03/23 with restrictions.   Patient had surgery on 12/15/22 Dr Dahlia Byes Laparoscopic Cecectomy

## 2023-01-04 ENCOUNTER — Telehealth: Payer: Self-pay | Admitting: *Deleted

## 2023-01-04 NOTE — Telephone Encounter (Signed)
Faxed return to work form to [place] at [phone]

## 2023-01-24 ENCOUNTER — Ambulatory Visit (INDEPENDENT_AMBULATORY_CARE_PROVIDER_SITE_OTHER): Payer: BC Managed Care – PPO | Admitting: Physician Assistant

## 2023-01-24 ENCOUNTER — Encounter: Payer: Self-pay | Admitting: Physician Assistant

## 2023-01-24 VITALS — BP 128/73 | HR 69 | Temp 98.3°F | Ht 64.0 in | Wt 176.0 lb

## 2023-01-24 DIAGNOSIS — Z09 Encounter for follow-up examination after completed treatment for conditions other than malignant neoplasm: Secondary | ICD-10-CM

## 2023-01-24 DIAGNOSIS — K353 Acute appendicitis with localized peritonitis, without perforation or gangrene: Secondary | ICD-10-CM

## 2023-01-24 NOTE — Patient Instructions (Signed)

## 2023-01-24 NOTE — Progress Notes (Signed)
[facility] POST-OP OFFICE VISIT  01/24/2023  HPI: Stephanie Koch is a 57 y.o. female ~6 weeks s/p hand assisted laparoscopic cecectomy for acute appendicitis with significant inflammatory response with Dr Dahlia Byes    She is doing well Incisional soreness to the superior portion; mild; only with reaching Not needing analgesics No fever, chills, nausea, emesis Eating well; normal bowel function Ambulating well No other complaints   Vital signs: BP 128/73   Pulse 69   Temp 98.3 F (36.8 C)   Ht 5' 4"$  (1.626 m)   Wt 176 lb (79.8 kg)   LMP 11/13/2010   SpO2 97%   BMI 30.21 kg/m    Physical Exam: Constitutional: Well appearing female, NAD Abdomen: Soft, non-tender, non-distended, no rebound/guarding Skin: Laparoscopic incisions are healing well, no erythema or drainage   Assessment/Plan: This is a 57 y.o. female ~6 weeks s/p hand assisted laparoscopic cecectomy for acute appendicitis with significant inflammatory response with Dr Dahlia Byes     - Nothing further from surgical perspective  - Pain control prn; OTC medications should be sufficient if needed  - She has complete all restrictions; work note given   - She can follow up on as needed basis; She understands to call with questions/concerns  -- Edison Simon, PA-C [facility] 01/24/2023, 1:40 PM M-F: 7am - 4pm

## 2023-01-30 ENCOUNTER — Ambulatory Visit: Payer: BC Managed Care – PPO | Admitting: Family Medicine

## 2023-01-30 ENCOUNTER — Encounter: Payer: Self-pay | Admitting: Family Medicine

## 2023-01-30 VITALS — BP 130/84 | HR 83 | Temp 98.2°F | Resp 16 | Ht 64.0 in | Wt 177.7 lb

## 2023-01-30 DIAGNOSIS — M545 Low back pain, unspecified: Secondary | ICD-10-CM | POA: Diagnosis not present

## 2023-01-30 DIAGNOSIS — K219 Gastro-esophageal reflux disease without esophagitis: Secondary | ICD-10-CM | POA: Diagnosis not present

## 2023-01-30 MED ORDER — MELOXICAM 15 MG PO TABS: 15.0000 mg | ORAL_TABLET | Freq: Every day | ORAL | 0 refills | Status: DC

## 2023-01-30 MED ORDER — PANTOPRAZOLE SODIUM 40 MG PO TBEC: 40.0000 mg | DELAYED_RELEASE_TABLET | Freq: Two times a day (BID) | ORAL | 0 refills | Status: DC

## 2023-01-30 MED ORDER — BACLOFEN 5 MG PO TABS: 5.0000 mg | ORAL_TABLET | Freq: Three times a day (TID) | ORAL | 0 refills | Status: DC | PRN

## 2023-01-30 NOTE — Progress Notes (Signed)
Patient ID: Stephanie Koch, female    DOB: 1966-03-16, 57 y.o.   MRN: [id number]  PCP: Delsa Grana, PA-C  Chief Complaint  Patient presents with   Back Pain    Lower mid back started sometimes this am    Subjective:   Stephanie Koch is a 57 y.o. female, presents to clinic with CC of the following:  HPI  Back pain onset this am low back pain started when getting ready this am  Its catching across her back seems worse not with bending over but when she stands up She's taken tylenol x 2 today and it didn't help No prior back pain or issues She denies radiation of pain She did just have acute appendicitis and has been recovering not doing much and just go cleared to return to work so she wonders if its related to that.  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 09/12/2013      Current Outpatient Medications:    Calcium Carbonate-Vit D-Min (CALCIUM 1200 PO), Take by mouth., Disp: , Rfl:    cholecalciferol (VITAMIN D) 1000 units tablet, Take 1,000 Units by mouth daily., Disp: , Rfl:    fluticasone (FLONASE) 50 MCG/ACT nasal spray, Use 2 spray(s) in each nostril once daily, Disp: 48 g, Rfl: 0   pantoprazole (PROTONIX) 40 MG tablet, Take 1 tablet (40 mg total) by mouth daily., Disp: 90 tablet, Rfl: 3   rosuvastatin (CRESTOR) 5 MG tablet, Take 1 tablet (5 mg total) by mouth daily., Disp: 90 tablet, Rfl: 3   venlafaxine XR (EFFEXOR XR) 37.5 MG 24 hr capsule, Take 1 capsule (37.5 mg total) by mouth daily with breakfast., Disp: 90 capsule, Rfl: 3   No Known Allergies   Social History   Tobacco Use   Smoking status: Never    Passive exposure:  Never   Smokeless tobacco: Never  Vaping Use   Vaping Use: Never used  Substance Use Topics   Alcohol use: No   Drug use: No      Chart Review Today: I personally reviewed active problem list, medication list, allergies, family history, social history, health maintenance, notes from last encounter, lab results, imaging with the patient/caregiver today.   Review of Systems  Constitutional: Negative.   HENT: Negative.    Eyes: Negative.   Respiratory: Negative.    Cardiovascular: Negative.   Gastrointestinal: Negative.   Endocrine: Negative.   Genitourinary: Negative.   Musculoskeletal: Negative.   Skin: Negative.   Allergic/Immunologic: Negative.   Neurological: Negative.   Hematological: Negative.   Psychiatric/Behavioral: Negative.    All other systems reviewed and are negative.      Objective:   Vitals:   01/30/23 1509  BP: 130/84  Pulse: 83  Resp: 16  Temp: 98.2 F (36.8 C)  TempSrc: Oral  SpO2: 96%  Weight: 177 lb 11.2 oz (80.6 kg)  Height: 5' 4"$  (1.626 m)    Body mass index is 30.5 kg/m.  Physical Exam Vitals and nursing note reviewed.  Constitutional:      Appearance: She is well-developed.  HENT:     Head: Normocephalic and atraumatic.     Nose: Nose normal.  Eyes:  General:        Right eye: No discharge.        Left eye: No discharge.     Conjunctiva/sclera: Conjunctivae normal.  Neck:     Trachea: No tracheal deviation.  Cardiovascular:     Rate and Rhythm: Normal rate and regular rhythm.     Pulses: Normal pulses.     Heart sounds: Normal heart sounds.  Pulmonary:     Effort: Pulmonary effort is normal. No respiratory distress.     Breath sounds: Normal breath sounds. No stridor.  Abdominal:     General: Bowel sounds are normal.     Palpations: Abdomen is soft.     Tenderness: There is no right CVA tenderness or left CVA tenderness.  Musculoskeletal:     Cervical back: Normal. No bony tenderness.     Thoracic back: No spasms,  tenderness or bony tenderness. Normal range of motion.     Lumbar back: No tenderness or bony tenderness. Decreased range of motion. Negative right straight leg raise test and negative left straight leg raise test.     Comments: Some evident pain with bending over and standing up and with getting out of chair and onto exam table No midline tenderness from cervical to lumbar spine   Skin:    General: Skin is warm and dry.     Findings: No rash.  Neurological:     Mental Status: She is alert.     Motor: No abnormal muscle tone.     Coordination: Coordination normal.  Psychiatric:        Behavior: Behavior normal.      Results for orders placed or performed during the hospital encounter of [id number]  Basic metabolic panel  Result Value Ref Range   Sodium 137 135 - 145 mmol/L   Potassium 3.9 3.5 - 5.1 mmol/L   Chloride 107 98 - 111 mmol/L   CO2 22 22 - 32 mmol/L   Glucose, Bld 186 (H) 70 - 99 mg/dL   BUN 10 6 - 20 mg/dL   Creatinine, Ser 0.78 0.44 - 1.00 mg/dL   Calcium 7.9 (L) 8.9 - 10.3 mg/dL   GFR, Estimated >60 >60 mL/min   Anion gap 8 5 - 15  CBC  Result Value Ref Range   WBC 13.0 (H) 4.0 - 10.5 K/uL   RBC 3.52 (L) 3.87 - 5.11 MIL/uL   Hemoglobin 11.0 (L) 12.0 - 15.0 g/dL   HCT 33.4 (L) 36.0 - 46.0 %   MCV 94.9 80.0 - 100.0 fL   MCH 31.3 26.0 - 34.0 pg   MCHC 32.9 30.0 - 36.0 g/dL   RDW 12.6 11.5 - 15.5 %   Platelets 233 150 - 400 K/uL   nRBC 0.0 0.0 - 0.2 %  HIV Antibody (routine testing w rflx)  Result Value Ref Range   HIV Screen 4th Generation wRfx Non Reactive Non Reactive  Surgical pathology  Result Value Ref Range   SURGICAL PATHOLOGY      SURGICAL PATHOLOGY CASE: [id number] PATIENT: Lillie Columbia Surgical Pathology Report     Specimen Submitted: A. Appendix  Clinical History: Acute appendicitis.      DIAGNOSIS:  A.  APPENDIX; APPENDECTOMY: - ACUTE APPENDICITIS WITH PERIAPPENDICITIS AND SEROSAL ADHESIONS. - NEGATIVE FOR  MALIGNANCY.   GROSS DESCRIPTION: A. Labeled: Cecum Received: Fresh Collection time: 9:29 PM on 12/15/2022 Placed into formalin time: 10:07 PM on 12/15/2022 Size: 7.2 cm long by 1.4 cm in diameter with an attached 4.5 x  2.5 x 1.9 cm portion of cecum and a 7.5 x 5.2 x 1.9 cm portion of mesoappendix External surface: The serosa is pink-tan, smooth, and glistening with scattered areas of adhesions. Perforation: None grossly appreciated. Fecalith: None grossly appreciated. Description: The proximal margin is inked blue.  The mucosa is pink-tan with a patent lumen.  The lumen becomes dilated towards the appendiceal orifice and the wall thickness ranges from 0.2 to 0.4 cm.   Block summary: 1 - one half bisected appendiceal tip 2 - 5 - one half bisected cecal resection margin 6 - 7 - representative cross-sections  RB 12/16/2022  Final Diagnosis performed by Tomasa Blase, MD.   Electronically signed 12/19/2022 12:53:25PM The electronic signature indicates that the named Attending Pathologist has evaluated the specimen Technical component performed at [facility], [address], [place], [place] [address] Lab: [phone] Dir: Rush Farmer, MD, MMM  Professional component performed at [facility], [facility], [place], [place], [place] [address] Lab: [phone] Dir: Kathi Simpers, MD        Assessment & Plan:     ICD-10-CM   1. Acute bilateral low back pain without sciatica  M54.50 meloxicam (MOBIC) 15 MG tablet    Baclofen 5 MG TABS   onset today, neg SLR, no other red flags, rest, heat, NSAIDs with increased PPI for GI protection, f/up if not improving or wanting PT referral    2. Gastroesophageal reflux disease without esophagitis  K21.9 pantoprazole (PROTONIX) 40 MG tablet      Return for 2-4 weeks if not improving.     Delsa Grana, PA-C 01/30/23 3:15 PM

## 2023-01-30 NOTE — Patient Instructions (Addendum)
Lumbar Strain A lumbar strain, which is sometimes called a low-back strain, is a stretch or tear in a muscle or tendons in the lower back (lumbar spine). Tendons are the strong cords of tissue that attach muscle to bone. This type of injury occurs when muscles or tendons are torn or are stretched beyond their limits. Lumbar strains can range from mild to severe. Mild strains may involve stretching a muscle or tendon without tearing it. These may heal in 1-2 weeks. More severe strains involve tearing of muscle fibers or tendons. These will cause more pain and may take 6-8 weeks to heal. What are the causes? This condition may be caused by: Trauma, such as a fall or a hit to the body. Twisting or overstretching the back. This may result from doing activities that take a lot of energy, such as lifting heavy objects. What increases the risk? A lumbar strain is more common in: Athletes. People with obesity. People who do repeated lifting, bending, or other movements that involve their back. What are the signs or symptoms? Symptoms of this condition may include: Sharp or dull pain in the lower back that does not go away. The pain may spread to the buttocks. Stiffness or limited range of motion. Sudden muscle tightening (spasms). How is this diagnosed? This condition may be diagnosed based on: Your symptoms. Your medical history. A physical exam. Imaging tests, such as: X-rays. MRI. How is this treated? Treatment for this condition may include: Rest. Applying heat and cold to the affected area. Over-the-counter medicines to help with pain and inflammation, such as NSAIDs. Prescription medicine for pain or to relax the muscles. These may be needed for a short time. Physical therapy exercises to improve movement and strength. Follow these instructions at home: Managing pain, stiffness, and swelling     If told, put ice on the injured area during the first 24 hours after your injury. Put  ice in a plastic bag. Place a towel between your skin and the bag. Leave the ice on for 20 minutes, 2-3 times a day. If told, apply heat to the affected area as often as told by your health care provider. Use the heat source that your health care provider recommends, such as a moist heat pack or a heating pad. Place a towel between your skin and the heat source. Leave the heat on for 20-30 minutes. If your skin turns bright red, remove the ice or heat right away to prevent skin damage. The risk of damage is higher if you cannot feel pain, heat, or cold. Activity Rest and return to your normal activities as told by your health care provider. Ask your health care provider what activities are safe for you. Do exercises as told by your health care provider. General instructions Take over-the-counter and prescription medicines only as told by your health care provider. Ask your health care provider if the medicine prescribed to you: Requires you to avoid driving or using machinery. Can cause constipation. You may need to take these actions to prevent or treat constipation: Drink enough fluid to keep your urine pale yellow. Take over-the-counter or prescription medicines. Eat foods that are high in fiber, such as beans, whole grains, and fresh fruits and vegetables. Limit foods that are high in fat and processed sugars, such as fried or sweet foods. Do not use any products that contain nicotine or tobacco. These products include cigarettes, chewing tobacco, and vaping devices, such as e-cigarettes. If you need help quitting, ask your  health care provider. How is this prevented? To prevent a future low-back injury: Always warm up properly before physical activity or sports. Cool down and stretch after being active. Use correct form when playing sports and lifting heavy objects. Bend your knees before you lift heavy objects. Use good posture when sitting and standing. Stay physically fit and keep a  healthy weight. Do at least 150 minutes of moderate intensity exercise each week, such as brisk walking or water aerobics. Do strength exercises at least 2 times each week. Contact a health care provider if: Your back pain does not improve after several weeks of treatment. Your symptoms get worse. You have a fever. Get help right away if: Your back pain is severe. You are unable to stand or walk. You develop pain in your legs. You have weakness in your buttocks or legs. You have trouble controlling when you urinate or when you have a bowel movement. You have frequent, painful, or bloody urination. This information is not intended to replace advice given to you by your health care provider. Make sure you discuss any questions you have with your health care provider. Document Revised: 06/21/2022 Document Reviewed: 06/21/2022 Elsevier Patient Education  [place] therapy can help ease sore, stiff, injured, and tight muscles and joints. Heat relaxes your muscles. This may help ease your pain and muscle spasms. What are the risks? If you have any of the following conditions, do not use heat therapy unless your doctor says it is okay. These conditions include: New bruises. Open wounds. Any of these in the area being treated: Healing wounds. Infected skin. Scarred skin. Problems with how blood moves through your body (circulation). Loss of feeling (numbness) in the part of your body that is being treated. Unusual swelling of the part of the body that is being treated. Blood clots. Diabetes. Heart disease. Cancer. Not being able to communicate pain. This may include young children and people who have problems with their brain function (dementia). How to use heat therapy There are different kinds of heat therapy. These include: Moist heat pack. Hot water bottle. Electric heating pad. Heated gel pack. Heated wrap. Warm water bath. Your doctor will tell  you how to use heat therapy. In general, you should: Place a towel between your skin and the heat source. Leave the heat on for 20-30 minutes. Your skin may turn pink. Take off the heat if your skin turns bright red. This is very important. If you cannot feel pain, heat, or cold, you have a greater risk of getting burned. Your doctor may also tell you to take a warm water bath. To do this: Put a non-slip pad in the bathtub to prevent a fall. Fill the bathtub with warm water. Check the water temperature. Soak in the water for 15-20 minutes, or as told by your doctor. Be careful when you stand up after the bath. You may feel dizzy. Pat yourself dry after the bath. Do not rub your skin to dry it. General recommendations for heat therapy Be careful not to burn your skin when using heat therapy. High heat or using heat for a long time can cause burns. Do not sleep while using heat therapy. Only use heat therapy while you are awake. Check your skin during heat therapy. Do not use heat therapy if you have a new injury, especially if you have swelling on the injured area. Do not use heat therapy on areas of your skin that are  already irritated, such as with a rash or sunburn. Do not use heat therapy if your skin turns bright red. Contact a doctor if: You have blisters, redness, swelling, or loss of feeling in the area where you use heat therapy. You have new pain. You have pain that gets worse. Summary Heat therapy is the use of heat to help ease sore, stiff, injured, and tight muscles and joints. There are different types of heat therapy. Your doctor will tell you which one to use. Only use heat therapy while you are awake. Watch your skin to make sure you do not get burned while using heat therapy. This information is not intended to replace advice given to you by your health care provider. Make sure you discuss any questions you have with your health care provider. Document Revised: 09/30/2020  Document Reviewed: 09/30/2020 Elsevier Patient Education  [employer].

## 2023-03-27 ENCOUNTER — Ambulatory Visit (INDEPENDENT_AMBULATORY_CARE_PROVIDER_SITE_OTHER): Payer: BC Managed Care – PPO | Admitting: Family Medicine

## 2023-03-27 ENCOUNTER — Other Ambulatory Visit (HOSPITAL_COMMUNITY)
Admission: RE | Admit: 2023-03-27 | Discharge: 2023-03-27 | Disposition: A | Discharge status: Home / Self Care | Payer: BC Managed Care – PPO | Source: Ambulatory Visit | Attending: Family Medicine | Admitting: Family Medicine

## 2023-03-27 ENCOUNTER — Encounter: Payer: Self-pay | Admitting: Family Medicine

## 2023-03-27 VITALS — BP 136/74 | HR 69 | Temp 98.0°F | Resp 16 | Ht 63.75 in | Wt 177.0 lb

## 2023-03-27 DIAGNOSIS — J301 Allergic rhinitis due to pollen: Secondary | ICD-10-CM

## 2023-03-27 DIAGNOSIS — Z0000 Encounter for general adult medical examination without abnormal findings: Secondary | ICD-10-CM | POA: Diagnosis present

## 2023-03-27 DIAGNOSIS — N83201 Unspecified ovarian cyst, right side: Secondary | ICD-10-CM

## 2023-03-27 DIAGNOSIS — Z124 Encounter for screening for malignant neoplasm of cervix: Secondary | ICD-10-CM | POA: Insufficient documentation

## 2023-03-27 DIAGNOSIS — R7303 Prediabetes: Secondary | ICD-10-CM | POA: Diagnosis not present

## 2023-03-27 DIAGNOSIS — F411 Generalized anxiety disorder: Secondary | ICD-10-CM

## 2023-03-27 DIAGNOSIS — E782 Mixed hyperlipidemia: Secondary | ICD-10-CM

## 2023-03-27 DIAGNOSIS — Z780 Asymptomatic menopausal state: Secondary | ICD-10-CM

## 2023-03-27 DIAGNOSIS — Z1231 Encounter for screening mammogram for malignant neoplasm of breast: Secondary | ICD-10-CM | POA: Diagnosis not present

## 2023-03-27 MED ORDER — VENLAFAXINE HCL ER 37.5 MG PO CP24: 37.5000 mg | ORAL_CAPSULE | Freq: Every day | ORAL | 3 refills | Status: DC

## 2023-03-27 MED ORDER — ROSUVASTATIN CALCIUM 5 MG PO TABS: 5.0000 mg | ORAL_TABLET | Freq: Every day | ORAL | 3 refills | Status: DC

## 2023-03-27 MED ORDER — FLUTICASONE PROPIONATE 50 MCG/ACT NA SUSP: 2.0000 | Freq: Every day | NASAL | 2 refills | Status: DC

## 2023-03-27 NOTE — Patient Instructions (Addendum)
[facility] at [facility] [address], [place], [place] [address] Scheduling phone #: [phone] Please call to schedule mammogram and bone density (to get a baseline)  Health Maintenance  Topic Date Due   Mammogram  Never done   Pap Smear  12/29/2022   COVID-19 Vaccine (3 - 2023-24 season) 04/12/2023*   Flu Shot  07/13/2023   DTaP/Tdap/Td vaccine (3 - Td or Tdap) 06/27/2027   Colon Cancer Screening  03/06/2030   Hepatitis C Screening: USPSTF Recommendation to screen - Ages 18-79 yo.  Completed   HIV Screening  Completed   Zoster (Shingles) Vaccine  Completed   HPV Vaccine  Aged Out  *Topic was postponed. The date shown is not the original due date.     Preventive Care 56-55 Years Old, Female Preventive care refers to lifestyle choices and visits with your health care provider that can promote health and wellness. Preventive care visits are also called wellness exams. What can I expect for my preventive care visit? Counseling Your health care provider may ask you questions about your: Medical history, including: Past medical problems. Family medical history. Pregnancy history. Current health, including: Menstrual cycle. Method of birth control. Emotional well-being. Home life and relationship well-being. Sexual activity and sexual health. Lifestyle, including: Alcohol, nicotine or tobacco, and drug use. Access to firearms. Diet, exercise, and sleep habits. Work and work Astron[employer]. Sunscreen use. Safety issues such as seatbelt and bike helmet use. Physical exam Your health care provider will check your: Height and weight. These may be used to calculate your BMI (body mass index). BMI is a measurement that tells if you are at a healthy weight. Waist circumference. This measures the distance around your waistline. This measurement also tells if you are at a healthy weight and may help predict your risk of certain diseases, such as type 2  diabetes and high blood pressure. Heart rate and blood pressure. Body temperature. Skin for abnormal spots. What immunizations do I need?  Vaccines are usually given at various ages, according to a schedule. Your health care provider will recommend vaccines for you based on your age, medical history, and lifestyle or other factors, such as travel or where you work. What tests do I need? Screening Your health care provider may recommend screening tests for certain conditions. This may include: Lipid and cholesterol levels. Diabetes screening. This is done by checking your blood sugar (glucose) after you have not eaten for a while (fasting). Pelvic exam and Pap test. Hepatitis B test. Hepatitis C test. HIV (human immunodeficiency virus) test. STI (sexually transmitted infection) testing, if you are at risk. Lung cancer screening. Colorectal cancer screening. Mammogram. Talk with your health care provider about when you should start having regular mammograms. This may depend on whether you have a family history of breast cancer. BRCA-related cancer screening. This may be done if you have a family history of breast, ovarian, tubal, or peritoneal cancers. Bone density scan. This is done to screen for osteoporosis. Talk with your health care provider about your test results, treatment options, and if necessary, the need for more tests. Follow these instructions at home: Eating and drinking  Eat a diet that includes fresh fruits and vegetables, whole grains, lean protein, and low-fat dairy products. Take vitamin and mineral supplements as recommended by your health care provider. Do not drink alcohol if: Your health care provider tells you not to drink. You are pregnant, may be pregnant, or are planning to become pregnant. If  you drink alcohol: Limit how much you have to 0-1 drink a day. Know how much alcohol is in your drink. In the [place] one drink equals one 12 oz bottle of beer (355 mL),  one 5 oz glass of wine (148 mL), or one 1 oz glass of hard liquor (44 mL). Lifestyle Brush your teeth every morning and night with fluoride toothpaste. Floss one time each day. Exercise for at least 30 minutes 5 or more days each week. Do not use any products that contain nicotine or tobacco. These products include cigarettes, chewing tobacco, and vaping devices, such as e-cigarettes. If you need help quitting, ask your health care provider. Do not use drugs. If you are sexually active, practice safe sex. Use a condom or other form of protection to prevent STIs. If you do not wish to become pregnant, use a form of birth control. If you plan to become pregnant, see your health care provider for a prepregnancy visit. Take aspirin only as told by your health care provider. Make sure that you understand how much to take and what form to take. Work with your health care provider to find out whether it is safe and beneficial for you to take aspirin daily. Find healthy ways to manage stress, such as: Meditation, yoga, or listening to music. Journaling. Talking to a trusted person. Spending time with friends and family. Minimize exposure to UV radiation to reduce your risk of skin cancer. Safety Always wear your seat belt while driving or riding in a vehicle. Do not drive: If you have been drinking alcohol. Do not ride with someone who has been drinking. When you are tired or distracted. While texting. If you have been using any mind-altering substances or drugs. Wear a helmet and other protective equipment during sports activities. If you have firearms in your house, make sure you follow all gun safety procedures. Seek help if you have been physically or sexually abused. What's next? Visit your health care provider once a year for an annual wellness visit. Ask your health care provider how often you should have your eyes and teeth checked. Stay up to date on all vaccines. This information is  not intended to replace advice given to you by your health care provider. Make sure you discuss any questions you have with your health care provider. Document Revised: 05/26/2021 Document Reviewed: 05/26/2021 Elsevier Patient Education  2023 [employer].

## 2023-03-27 NOTE — Progress Notes (Signed)
Patient: Stephanie Koch, Female    DOB: 03/24/1966, 57 y.o.   MRN: [id number] Danelle Berry, PA-C Visit Date: 03/27/2023  Today's Provider: Danelle Berry, PA-C   Chief Complaint  Patient presents with   Annual Exam   Subjective:   Annual physical exam:  Stephanie Koch is a 57 y.o. female who presents today for complete physical exam:  Exercise/Activity: inactive Diet/nutrition: works on healthy diet Sleep: pretty well  SDOH Screenings   Food Insecurity: No Food Insecurity (03/27/2023)  Housing: Low Risk  (03/27/2023)  Transportation Needs: No Transportation Needs (03/27/2023)  Utilities: Not At Risk (03/27/2023)  Alcohol Screen: Low Risk  (03/27/2023)  Depression (PHQ2-9): Low Risk  (03/27/2023)  Financial Resource Strain: Low Risk  (03/27/2023)  Physical Activity: Inactive (03/27/2023)  Social Connections: Moderately Isolated (03/27/2023)  Stress: No Stress Concern Present (03/27/2023)  Tobacco Use: Low Risk  (03/27/2023)   USPSTF grade A and B recommendations - reviewed and addressed today  Depression:  Phq 9 completed today by patient, was reviewed by me with patient in the room PHQ score is neg, pt feels good    03/27/2023    8:48 AM 01/30/2023    3:09 PM 12/15/2022    2:44 PM 03/23/2022    9:15 AM  PHQ 2/9 Scores  PHQ - 2 Score 0 0 0 0  PHQ- 9 Score 0 0 0 0      03/27/2023    8:48 AM 01/30/2023    3:09 PM 12/15/2022    2:44 PM 03/23/2022    9:15 AM 03/30/2021    8:38 AM  Depression screen PHQ 2/9  Decreased Interest 0 0 0 0 0  Down, Depressed, Hopeless 0 0 0 0 0  PHQ - 2 Score 0 0 0 0 0  Altered sleeping 0 0 0 0   Tired, decreased energy 0 0 0 0   Change in appetite 0 0 0 0   Feeling bad or failure about yourself  0 0 0 0   Trouble concentrating 0 0 0 0   Moving slowly or fidgety/restless 0 0 0 0   Suicidal thoughts 0 0 0 0   PHQ-9 Score 0 0 0 0   Difficult doing work/chores Not difficult at all Not difficult at all Not difficult at all Not difficult at all      Alcohol screening: Flowsheet Row Office Visit from 03/27/2023 in [facility]  AUDIT-C Score 0       Immunizations and Health Maintenance: Health Maintenance  Topic Date Due   MAMMOGRAM  Never done   PAP SMEAR-Modifier  12/29/2022   COVID-19 Vaccine (3 - 2023-24 season) 04/12/2023 (Originally 08/12/2022)   INFLUENZA VACCINE  07/13/2023   DTaP/Tdap/Td (3 - Td or Tdap) 06/27/2027   COLONOSCOPY (Pts 45-54yrs Insurance coverage will need to be confirmed)  03/06/2030   Hepatitis C Screening  Completed   HIV Screening  Completed   Zoster Vaccines- Shingrix  Completed   HPV VACCINES  Aged Out     Hep C Screening: done previously  STD testing and prevention (HIV/chl/gon/syphilis):  see above, no additional testing desired by pt today  Intimate partner violence: safe   Sexual History/Pain during Intercourse: Married, no concerns or sx   Menstrual History/LMP/Abnormal Bleeding: postmenopausal, no AUB Patient's last menstrual period was 11/13/2010.  Incontinence Symptoms: rarely has sx  Breast cancer: due Last Mammogram: *see HM list above BRCA gene screening: none  Cervical cancer screening: due - pap  last done 2021 Pt denies family hx of cancers - breast, ovarian, uterine, colon:     Osteoporosis:   Discussion on osteoporosis per age, including high calcium and vitamin D supplementation, weight bearing exercises Pt is taking womans multivitamin -  supplementing with daily calcium/Vit D. Stopped calcium and vit d due to constipation Not done -  Bone scan/dexa Roughly experienced menopause in early 40's  (more than 10 years ago)  Skin cancer:  Hx of skin CA -  NO Discussed atypical lesions   Colorectal cancer:   Colonoscopy is UTD, due in 2031 Discussed concerning signs and sx of CRC, pt denies sx  Lung cancer:   Low Dose CT Chest recommended if Age 48-80 years, 20 pack-year currently smoking OR have quit w/in 15years. Patient does not  qualify.    Social History   Tobacco Use   Smoking status: Never    Passive exposure: Never   Smokeless tobacco: Never  Vaping Use   Vaping Use: Never used  Substance Use Topics   Alcohol use: No   Drug use: No     Flowsheet Row Office Visit from 03/27/2023 in [facility]  AUDIT-C Score 0       Family History  Problem Relation Age of Onset   Diabetes Mother    Hypertension Mother    Hyperlipidemia Mother    Stroke Father    Hypertension Father    Hyperlipidemia Father    Cancer Paternal Uncle        bowel   Diabetes Maternal Grandmother    Heart disease Maternal Grandmother    Congestive Heart Failure Maternal Grandmother    Cancer Maternal Grandfather        lung   Stroke Paternal Grandfather    Emphysema Paternal Grandmother    COPD Neg Hx      Blood pressure/Hypertension: BP Readings from Last 3 Encounters:  03/27/23 136/74  01/30/23 130/84  01/24/23 128/73    Weight/Obesity: Wt Readings from Last 3 Encounters:  03/27/23 177 lb (80.3 kg)  01/30/23 177 lb 11.2 oz (80.6 kg)  01/24/23 176 lb (79.8 kg)   BMI Readings from Last 3 Encounters:  03/27/23 30.62 kg/m  01/30/23 30.50 kg/m  01/24/23 30.21 kg/m     Lipids:  Lab Results  Component Value Date   CHOL 138 03/25/2022   CHOL 168 03/30/2021   CHOL 263 (H) 01/20/2021   Lab Results  Component Value Date   HDL 49 (L) 03/25/2022   HDL 61 03/30/2021   HDL 61 01/20/2021   Lab Results  Component Value Date   LDLCALC 70 03/25/2022   LDLCALC 86 03/30/2021   LDLCALC 178 (H) 01/20/2021   Lab Results  Component Value Date   TRIG 106 03/25/2022   TRIG 110 03/30/2021   TRIG 114 01/20/2021   Lab Results  Component Value Date   CHOLHDL 2.8 03/25/2022   CHOLHDL 2.8 03/30/2021   CHOLHDL 4.3 01/20/2021   No results found for: "LDLDIRECT" Based on the results of lipid panel his/her cardiovascular risk factor ( using Poole Cohort )  in the next 10 years is: The  10-year ASCVD risk score (Arnett DK, et al., 2019) is: 1.7%   Values used to calculate the score:     Age: 45 years     Sex: Female     Is Non-Hispanic African American: No     Diabetic: No     Tobacco smoker: No     Systolic Blood  Pressure: 136 mmHg     Is BP treated: No     HDL Cholesterol: 49 mg/dL     Total Cholesterol: 138 mg/dL  Glucose:  Glucose, Bld  Date Value Ref Range Status  12/16/2022 186 (H) 70 - 99 mg/dL Final    Comment:    Glucose reference range applies only to samples taken after fasting for at least 8 hours.  12/15/2022 92 70 - 99 mg/dL Final    Comment:    Glucose reference range applies only to samples taken after fasting for at least 8 hours.  03/25/2022 95 65 - 99 mg/dL Final    Comment:    .            Fasting reference interval .     Advanced Care Planning:  A voluntary discussion about advance care planning including the explanation and discussion of advance directives.   Discussed health care proxy and Living will, and the patient was able to identify a health care proxy as husband.   Patient does not have a living will at present time.   Social History       Social History   Socioeconomic History   Marital status: Married    Spouse name: Benny   Number of children: 1   Years of education: 13   Highest education level: Some college, no degree  Occupational History   Not on file  Tobacco Use   Smoking status: Never    Passive exposure: Never   Smokeless tobacco: Never  Vaping Use   Vaping Use: Never used  Substance and Sexual Activity   Alcohol use: No   Drug use: No   Sexual activity: Yes    Birth control/protection: Post-menopausal  Other Topics Concern   Not on file  Social History Narrative   Not on file   Social Determinants of Health   Financial Resource Strain: Low Risk  (03/27/2023)   Overall Financial Resource Strain (CARDIA)    Difficulty of Paying Living Expenses: Not hard at all  Food Insecurity: No Food  Insecurity (03/27/2023)   Hunger Vital Sign    Worried About Running Out of Food in the Last Year: Never true    Ran Out of Food in the Last Year: Never true  Transportation Needs: No Transportation Needs (03/27/2023)   PRAPARE - Administrator, Civil Service (Medical): No    Lack of Transportation (Non-Medical): No  Physical Activity: Inactive (03/27/2023)   Exercise Vital Sign    Days of Exercise per Week: 0 days    Minutes of Exercise per Session: 0 min  Stress: No Stress Concern Present (03/27/2023)   [employer] of Occupational Health - Occupational Stress Questionnaire    Feeling of Stress : Not at all  Social Connections: Moderately Isolated (03/27/2023)   Social Connection and Isolation Panel [NHANES]    Frequency of Communication with Friends and Family: More than three times a week    Frequency of Social Gatherings with Friends and Family: More than three times a week    Attends Religious Services: Never    Database administrator or Organizations: No    Attends Banker Meetings: Never    Marital Status: Married    Family History        Family History  Problem Relation Age of Onset   Diabetes Mother    Hypertension Mother    Hyperlipidemia Mother    Stroke Father    Hypertension Father  Hyperlipidemia Father    Cancer Paternal Uncle        bowel   Diabetes Maternal Grandmother    Heart disease Maternal Grandmother    Congestive Heart Failure Maternal Grandmother    Cancer Maternal Grandfather        lung   Stroke Paternal Grandfather    Emphysema Paternal Grandmother    COPD Neg Hx     Patient Active Problem List   Diagnosis Date Noted   Pre-diabetes 03/23/2022   Gastroesophageal reflux disease without esophagitis 03/23/2022   Seasonal allergic rhinitis 05/26/2015   Mixed hyperlipidemia 05/26/2015   Anxiety state 05/26/2015   Menopausal state 05/26/2015   Overweight (BMI 25.0-29.9) 05/26/2015   Pap smear abnormality of cervix  with ASCUS favoring benign 09/12/2013    Past Surgical History:  Procedure Laterality Date   APPENDECTOMY  12/15/2022   COLONOSCOPY WITH PROPOFOL N/A 03/06/2020   Procedure: COLONOSCOPY WITH PROPOFOL;  Surgeon: [name], MD;  Location: [facility] ENDOSCOPY;  Service: Gastroenterology;  Laterality: N/A;   ESOPHAGOGASTRODUODENOSCOPY (EGD) WITH PROPOFOL N/A 03/06/2020   Procedure: ESOPHAGOGASTRODUODENOSCOPY (EGD) WITH PROPOFOL;  Surgeon: [name], MD;  Location: [facility] ENDOSCOPY;  Service: Gastroenterology;  Laterality: N/A;   LAPAROSCOPIC APPENDECTOMY N/A 12/15/2022   Procedure: LAPAROSCOPIC CECECTOMY;  Surgeon: Leafy Ro, MD;  Location: [facility] ORS;  Service: General;  Laterality: N/A;     Current Outpatient Medications:    Calcium Carbonate-Vit D-Min (CALCIUM 1200 PO), Take by mouth., Disp: , Rfl:    cholecalciferol (VITAMIN D) 1000 units tablet, Take 1,000 Units by mouth daily., Disp: , Rfl:    pantoprazole (PROTONIX) 40 MG tablet, Take 1 tablet (40 mg total) by mouth 2 (two) times daily., Disp: 60 tablet, Rfl: 0   fluticasone (FLONASE) 50 MCG/ACT nasal spray, Place 2 sprays into both nostrils daily. Use 2 spray(s) in each nostril once daily, Disp: 48 g, Rfl: 2   rosuvastatin (CRESTOR) 5 MG tablet, Take 1 tablet (5 mg total) by mouth daily., Disp: 90 tablet, Rfl: 3   venlafaxine XR (EFFEXOR XR) 37.5 MG 24 hr capsule, Take 1 capsule (37.5 mg total) by mouth daily with breakfast., Disp: 90 capsule, Rfl: 3  No Known Allergies  Patient Care Team: Danelle Berry, PA-C as PCP - General (Family Medicine) Lada, Janit Bern, MD as PCP - Family Medicine (Family Medicine) Oh, Ezzard Standing, MD (Inactive) as Consulting Physician (Gastroenterology)   Chart Review: I personally reviewed active problem list, medication list, allergies, family history, social history, health maintenance, notes from last encounter, lab results, imaging with the patient/caregiver today.   Review of Systems  Constitutional:  Negative.   HENT: Negative.    Eyes: Negative.   Respiratory: Negative.    Cardiovascular: Negative.   Gastrointestinal: Negative.   Endocrine: Negative.   Genitourinary: Negative.   Musculoskeletal: Negative.   Skin: Negative.   Allergic/Immunologic: Negative.   Neurological: Negative.   Hematological: Negative.   Psychiatric/Behavioral: Negative.    All other systems reviewed and are negative.         Objective:   Vitals:  Vitals:   03/27/23 0859  BP: 136/74  Pulse: 69  Resp: 16  Temp: 98 F (36.7 C)  TempSrc: Oral  SpO2: 97%  Weight: 177 lb (80.3 kg)  Height: 5' 3.75" (1.619 m)    Body mass index is 30.62 kg/m.  Physical Exam Vitals and nursing note reviewed. Exam conducted with a chaperone present.  Constitutional:      General: She is not in acute  distress.    Appearance: Normal appearance. She is well-developed, well-groomed and overweight. She is not ill-appearing, toxic-appearing or diaphoretic.  HENT:     Head: Normocephalic and atraumatic.     Right Ear: External ear normal. There is impacted cerumen.     Left Ear: External ear normal. There is impacted cerumen.     Nose: Congestion and rhinorrhea present.     Mouth/Throat:     Pharynx: Oropharynx is clear. Uvula midline. No oropharyngeal exudate or posterior oropharyngeal erythema.  Eyes:     General: Lids are normal. No scleral icterus.       Right eye: No discharge.        Left eye: No discharge.     Conjunctiva/sclera: Conjunctivae normal.  Neck:     Trachea: Phonation normal. No tracheal deviation.  Cardiovascular:     Rate and Rhythm: Normal rate and regular rhythm.     Pulses: Normal pulses.          Radial pulses are 2+ on the right side and 2+ on the left side.       Posterior tibial pulses are 2+ on the right side and 2+ on the left side.     Heart sounds: Normal heart sounds. No murmur heard.    No friction rub. No gallop.  Pulmonary:     Effort: Pulmonary effort is normal. No  respiratory distress.     Breath sounds: Normal breath sounds. No stridor. No wheezing, rhonchi or rales.  Chest:     Chest wall: No tenderness.  Abdominal:     General: Bowel sounds are normal. There is no distension.     Palpations: Abdomen is soft.     Tenderness: There is no abdominal tenderness. There is no guarding or rebound.  Genitourinary:    General: Normal vulva.     Vagina: Normal.     Cervix: No cervical motion tenderness, discharge, friability, lesion, erythema, cervical bleeding or eversion.     Adnexa: Right adnexa normal and left adnexa normal.       Right: No mass, tenderness or fullness.         Left: No mass, tenderness or fullness.       Comments: PAP obtained Musculoskeletal:        General: No deformity. Normal range of motion.     Cervical back: Normal range of motion and neck supple.  Lymphadenopathy:     Cervical: No cervical adenopathy.  Skin:    General: Skin is warm and dry.     Capillary Refill: Capillary refill takes less than 2 seconds.     Coloration: Skin is not pale.     Findings: No rash.  Neurological:     Mental Status: She is alert. Mental status is at baseline.     Motor: No abnormal muscle tone.     Gait: Gait normal.  Psychiatric:        Mood and Affect: Mood normal.        Speech: Speech normal.        Behavior: Behavior normal. Behavior is cooperative.       Fall Risk:    03/27/2023    8:48 AM 01/30/2023    3:09 PM 12/15/2022    2:44 PM 03/23/2022    9:14 AM 03/30/2021    8:38 AM  Fall Risk   Falls in the past year? 0 0 0 0 0  Number falls in past yr: 0 0 0 0 0  Injury with  Fall? 0 0 0 0 0  Risk for fall due to : No Fall Risks No Fall Risks No Fall Risks    Follow up Falls prevention discussed;Education provided;Falls evaluation completed Falls prevention discussed;Education provided;Falls evaluation completed Falls prevention discussed;Education provided;Falls evaluation completed      Functional Status Survey: Is the  patient deaf or have difficulty hearing?: No Does the patient have difficulty seeing, even when wearing glasses/contacts?: No Does the patient have difficulty concentrating, remembering, or making decisions?: No Does the patient have difficulty walking or climbing stairs?: No Does the patient have difficulty dressing or bathing?: No Does the patient have difficulty doing errands alone such as visiting a doctor's office or shopping?: No   Assessment & Plan:    CPE completed today  USPSTF grade A and B recommendations reviewed with patient; age-appropriate recommendations, preventive care, screening tests, etc discussed and encouraged; healthy living encouraged; see AVS for patient education given to patient  Discussed importance of 150 minutes of physical activity weekly, AHA exercise recommendations given to pt in AVS/handout  Discussed importance of healthy diet:  eating lean meats and proteins, avoiding trans fats and saturated fats, avoid simple sugars and excessive carbs in diet, eat 6 servings of fruit/vegetables daily and drink plenty of water and avoid sweet beverages.    Recommended pt to do annual eye exam and routine dental exams/cleanings  Depression, alcohol, fall screening completed as documented above and per flowsheets  Advance Care planning information and packet discussed and offered today, encouraged pt to discuss with family members/spouse/partner/friends and complete Advanced directive packet and bring copy to office   Reviewed Health Maintenance: Health Maintenance  Topic Date Due   MAMMOGRAM  Never done   PAP SMEAR-Modifier  12/29/2022   COVID-19 Vaccine (3 - 2023-24 season) 04/12/2023 (Originally 08/12/2022)   INFLUENZA VACCINE  07/13/2023   DTaP/Tdap/Td (3 - Td or Tdap) 06/27/2027   COLONOSCOPY (Pts 45-38yrs Insurance coverage will need to be confirmed)  03/06/2030   Hepatitis C Screening  Completed   HIV Screening  Completed   Zoster Vaccines- Shingrix   Completed   HPV VACCINES  Aged Out    Immunizations: Immunization History  Administered Date(s) Administered   Influenza,inj,Quad PF,6+ Mos 08/15/2019, 09/24/2020   Influenza-Unspecified 10/12/2022   Moderna Sars-Covid-2 Vaccination 02/08/2020, 03/14/2020   PPD Test 06/26/2017   Tdap 07/12/2010, 06/26/2017   Zoster Recombinat (Shingrix) 08/12/2021, 09/13/2021   Vaccines:  HPV: n/a Shingrix: completed Pneumonia:  n/a for age and med hx, educated and discussed with patient. Flu:  educated and discussed with patient.     ICD-10-CM   1. Annual physical exam  Z00.00 MM 3D SCREENING MAMMOGRAM BILATERAL BREAST    Cytology - PAP    COMPLETE METABOLIC PANEL WITH GFR    CBC with Differential/Platelet    Lipid panel    DG Bone Density    2. Encounter for screening mammogram for malignant neoplasm of breast  Z12.31 MM 3D SCREENING MAMMOGRAM BILATERAL BREAST    3. Pre-diabetes  R73.03 COMPLETE METABOLIC PANEL WITH GFR    Hemoglobin A1c   monitoring labs    4. Screening for malignant neoplasm of cervix  Z12.4 Cytology - PAP    5. Hypocalcemia  E83.51 COMPLETE METABOLIC PANEL WITH GFR    VITAMIN D 25 Hydroxy (Vit-D Deficiency, Fractures)   recheck labs with Vit D - very abnormal with labs in hospital    6. Mixed hyperlipidemia  E78.2 COMPLETE METABOLIC PANEL WITH GFR    Lipid  panel    Hemoglobin A1c    rosuvastatin (CRESTOR) 5 MG tablet   on crestor 5    7. Postmenopausal estrogen deficiency  Z78.0 DG Bone Density   baseline dexa recommended    8. Seasonal allergic rhinitis due to pollen  J30.1 fluticasone (FLONASE) 50 MCG/ACT nasal spray   severe sx over the past couple weeks, she has doubled up on antihistamines, can try adding claritin D, discussed addition of singulair    9. Anxiety state  F41.1 venlafaxine XR (EFFEXOR XR) 37.5 MG 24 hr capsule   mood good, refill on meds    10. Right ovarian cyst  N83.201 US PELVIC COMPLETE WITH TRANSVAGINAL   simple 5.7 cm cyst  recommended [place] f/up - ordered today - CT was in Jan with acute appendicitis          Danelle Berry, PA-C 03/27/23 9:55 AM  [facility]

## 2023-03-28 LAB — HEMOGLOBIN A1C
Hgb A1c MFr Bld: 6.1 % of total Hgb — ABNORMAL HIGH (ref <5.7)
Mean Plasma Glucose: 128 mg/dL
eAG (mmol/L): 7.1 mmol/L

## 2023-03-28 LAB — COMPLETE METABOLIC PANEL WITH GFR
AG Ratio: 1.5 (calc) (ref 1.0–2.5)
ALT: 15 U/L (ref 6–29)
AST: 17 U/L (ref 10–35)
Albumin: 4.4 g/dL (ref 3.6–5.1)
Alkaline phosphatase (APISO): 46 U/L (ref 37–153)
BUN: 16 mg/dL (ref 7–25)
CO2: 28 mmol/L (ref 20–32)
Calcium: 9.5 mg/dL (ref 8.6–10.4)
Chloride: 104 mmol/L (ref 98–110)
Creat: 0.91 mg/dL (ref 0.50–1.03)
Globulin: 3 g/dL (calc) (ref 1.9–3.7)
Glucose, Bld: 98 mg/dL (ref 65–99)
Potassium: 4.2 mmol/L (ref 3.5–5.3)
Sodium: 142 mmol/L (ref 135–146)
Total Bilirubin: 0.6 mg/dL (ref 0.2–1.2)
Total Protein: 7.4 g/dL (ref 6.1–8.1)
eGFR: 75 mL/min/{1.73_m2} (ref >=60)

## 2023-03-28 LAB — CBC WITH DIFFERENTIAL/PLATELET
Absolute Monocytes: 480 cells/uL (ref 200–950)
Basophils Absolute: 30 cells/uL (ref 0–200)
Basophils Relative: 0.5 %
Eosinophils Absolute: 138 cells/uL (ref 15–500)
Eosinophils Relative: 2.3 %
HCT: 37.4 % (ref 35.0–45.0)
Hemoglobin: 12.5 g/dL (ref 11.7–15.5)
Lymphs Abs: 1842 cells/uL (ref 850–3900)
MCH: 31.2 pg (ref 27.0–33.0)
MCHC: 33.4 g/dL (ref 32.0–36.0)
MCV: 93.3 fL (ref 80.0–100.0)
MPV: 10.9 fL (ref 7.5–12.5)
Monocytes Relative: 8 %
Neutro Abs: 3510 cells/uL (ref 1500–7800)
Neutrophils Relative %: 58.5 %
Platelets: 299 10*3/uL (ref 140–400)
RBC: 4.01 10*6/uL (ref 3.80–5.10)
RDW: 12.8 % (ref 11.0–15.0)
Total Lymphocyte: 30.7 %
WBC: 6 10*3/uL (ref 3.8–10.8)

## 2023-03-28 LAB — VITAMIN D 25 HYDROXY (VIT D DEFICIENCY, FRACTURES): Vit D, 25-Hydroxy: 37 ng/mL (ref 30–100)

## 2023-03-28 LAB — LIPID PANEL
Cholesterol: 165 mg/dL (ref <200)
HDL: 61 mg/dL (ref >=50)
LDL Cholesterol (Calc): 83 mg/dL (calc)
Non-HDL Cholesterol (Calc): 104 mg/dL (calc) (ref <130)
Total CHOL/HDL Ratio: 2.7 (calc) (ref <5.0)
Triglycerides: 113 mg/dL (ref <150)

## 2023-03-29 LAB — CYTOLOGY - PAP
Comment: NEGATIVE
Diagnosis: NEGATIVE
High risk HPV: NEGATIVE

## 2023-04-03 ENCOUNTER — Ambulatory Visit
Admission: RE | Admit: 2023-04-03 | Discharge: 2023-04-03 | Disposition: A | Discharge status: Home / Self Care | Payer: BC Managed Care – PPO | Source: Ambulatory Visit | Attending: Family Medicine | Admitting: Family Medicine

## 2023-04-03 DIAGNOSIS — N83201 Unspecified ovarian cyst, right side: Secondary | ICD-10-CM | POA: Insufficient documentation

## 2023-04-29 ENCOUNTER — Other Ambulatory Visit: Payer: Self-pay | Admitting: Internal Medicine

## 2023-04-29 DIAGNOSIS — K219 Gastro-esophageal reflux disease without esophagitis: Secondary | ICD-10-CM

## 2023-05-01 NOTE — Telephone Encounter (Signed)
Requested Prescriptions  Pending Prescriptions Disp Refills   pantoprazole (PROTONIX) 40 MG tablet [Pharmacy Med Name: Pantoprazole Sodium 40 MG Oral Tablet Delayed Release] 90 tablet 0    Sig: Take 1 tablet by mouth once daily     Gastroenterology: Proton Pump Inhibitors Passed - 04/29/2023  8:42 AM      Passed - Valid encounter within last 12 months    Recent Outpatient Visits           1 month ago Annual physical exam   [facility] Danelle Berry, PA-C   3 months ago Acute bilateral low back pain without sciatica   [facility] Danelle Berry, PA-C   4 months ago Right lower quadrant abdominal pain   [facility] Berniece Salines, FNP   1 year ago Mixed hyperlipidemia   [facility] Margarita Mail, DO   2 years ago Hyperlipidemia LDL goal <130   [facility] Alba Cory, MD       Future Appointments             In 4 months Danelle Berry, PA-C [facility]

## 2023-05-02 ENCOUNTER — Other Ambulatory Visit: Payer: Self-pay | Admitting: Family Medicine

## 2023-05-02 DIAGNOSIS — K219 Gastro-esophageal reflux disease without esophagitis: Secondary | ICD-10-CM

## 2023-07-24 ENCOUNTER — Other Ambulatory Visit: Payer: Self-pay | Admitting: Family Medicine

## 2023-07-24 DIAGNOSIS — K219 Gastro-esophageal reflux disease without esophagitis: Secondary | ICD-10-CM

## 2023-09-26 ENCOUNTER — Ambulatory Visit: Payer: BC Managed Care – PPO | Admitting: Physician Assistant

## 2023-09-26 ENCOUNTER — Encounter: Payer: Self-pay | Admitting: Physician Assistant

## 2023-09-26 VITALS — BP 124/82 | HR 75 | Temp 98.0°F | Resp 16 | Ht 63.75 in | Wt 171.3 lb

## 2023-09-26 DIAGNOSIS — R7303 Prediabetes: Secondary | ICD-10-CM

## 2023-09-26 DIAGNOSIS — F411 Generalized anxiety disorder: Secondary | ICD-10-CM | POA: Diagnosis not present

## 2023-09-26 DIAGNOSIS — Z23 Encounter for immunization: Secondary | ICD-10-CM | POA: Diagnosis not present

## 2023-09-26 DIAGNOSIS — E782 Mixed hyperlipidemia: Secondary | ICD-10-CM

## 2023-09-26 DIAGNOSIS — K219 Gastro-esophageal reflux disease without esophagitis: Secondary | ICD-10-CM

## 2023-09-26 DIAGNOSIS — E663 Overweight: Secondary | ICD-10-CM

## 2023-09-26 NOTE — Progress Notes (Unsigned)
Established Patient Office Visit  Name: Stephanie Koch   MRN: [id number]    DOB: 1966/06/04   Date:09/27/2023  Today's Provider: Jacquelin Hawking, MHS, PA-C Introduced myself to the patient as a PA-C and provided education on APPs in clinical practice.         Subjective  Chief Complaint  Chief Complaint  Patient presents with   Hyperlipidemia   Gastroesophageal Reflux    HPI   HYPERLIPIDEMIA Hyperlipidemia status: good compliance Satisfied with current treatment?  yes Side effects:  no Medication compliance: good compliance Past cholesterol meds: rosuvastatin (crestor) Supplements: none Aspirin:  no The 10-year ASCVD risk score (Arnett DK, et al., 2019) is: 1.5%   Values used to calculate the score:     Age: 57 years     Sex: Female     Is Non-Hispanic African American: No     Diabetic: No     Tobacco smoker: No     Systolic Blood Pressure: 124 mmHg     Is BP treated: No     HDL Cholesterol: 65 mg/dL     Total Cholesterol: 167 mg/dL Chest pain:  no Coronary artery disease:  no Family history CAD:   unsure - her dad had strokes  Family history early CAD:  no  GERD She reports this has been okay She does report occasional flares with trigger foods but TUMS seems to help with managing  She is still taking Protonix as directed  Mood She reports it has been "good"  She is taking Venlafaxine - she feels like this is a good dose for her  She denies side effects or concerns with taking it  Reports she is able to handle stressors without much issue as before        09/26/2023    3:32 PM 03/27/2023    8:48 AM 01/30/2023    3:09 PM 12/15/2022    2:44 PM 03/23/2022    9:15 AM  Depression screen PHQ 2/9  Decreased Interest 0 0 0 0 0  Down, Depressed, Hopeless 0 0 0 0 0  PHQ - 2 Score 0 0 0 0 0  Altered sleeping 0 0 0 0 0  Tired, decreased energy 0 0 0 0 0  Change in appetite 0 0 0 0 0  Feeling bad or failure about yourself  0 0 0 0 0  Trouble  concentrating 0 0 0 0 0  Moving slowly or fidgety/restless 0 0 0 0 0  Suicidal thoughts 0 0 0 0 0  PHQ-9 Score 0 0 0 0 0  Difficult doing work/chores Not difficult at all Not difficult at all Not difficult at all Not difficult at all Not difficult at all      09/26/2023    3:32 PM 12/09/2015    1:08 PM  GAD 7 : Generalized Anxiety Score  Nervous, Anxious, on Edge 0 1  Control/stop worrying 0 1  Worry too much - different things 0 0  Trouble relaxing 0 0  Restless 0 0  Easily annoyed or irritable 0 0  Afraid - awful might happen 0 1  Total GAD 7 Score 0 3  Anxiety Difficulty Not difficult at all Not difficult at all         Patient Active Problem List   Diagnosis Date Noted   Pre-diabetes 03/23/2022   Gastroesophageal reflux disease without esophagitis 03/23/2022   Seasonal allergic rhinitis 05/26/2015   Mixed hyperlipidemia 05/26/2015  Anxiety state 05/26/2015   Menopausal state 05/26/2015   Overweight (BMI 25.0-29.9) 05/26/2015   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 09/12/2013    Past Surgical History:  Procedure Laterality Date   APPENDECTOMY  12/15/2022   COLONOSCOPY WITH PROPOFOL N/A 03/06/2020   Procedure: COLONOSCOPY WITH PROPOFOL;  Surgeon: [name], MD;  Location: [facility] ENDOSCOPY;  Service: Gastroenterology;  Laterality: N/A;   ESOPHAGOGASTRODUODENOSCOPY (EGD) WITH PROPOFOL N/A 03/06/2020   Procedure: ESOPHAGOGASTRODUODENOSCOPY (EGD) WITH PROPOFOL;  Surgeon: [name], MD;  Location: [facility] ENDOSCOPY;  Service: Gastroenterology;  Laterality: N/A;   LAPAROSCOPIC APPENDECTOMY N/A 12/15/2022   Procedure: LAPAROSCOPIC CECECTOMY;  Surgeon: Leafy Ro, MD;  Location: [facility] ORS;  Service: General;  Laterality: N/A;    Family History  Problem Relation Age of Onset   Diabetes Mother    Hypertension Mother    Hyperlipidemia Mother    Stroke Father    Hypertension Father    Hyperlipidemia Father    Cancer Paternal Uncle        bowel    Diabetes Maternal Grandmother    Heart disease Maternal Grandmother    Congestive Heart Failure Maternal Grandmother    Cancer Maternal Grandfather        lung   Stroke Paternal Grandfather    Emphysema Paternal Grandmother    COPD Neg Hx     Social History   Tobacco Use   Smoking status: Never    Passive exposure: Never   Smokeless tobacco: Never  Substance Use Topics   Alcohol use: No     Current Outpatient Medications:    Calcium Carbonate-Vit D-Min (CALCIUM 1200 PO), Take by mouth., Disp: , Rfl:    cholecalciferol (VITAMIN D) 1000 units tablet, Take 1,000 Units by mouth daily., Disp: , Rfl:    fluticasone (FLONASE) 50 MCG/ACT nasal spray, Place 2 sprays into both nostrils daily. Use 2 spray(s) in each nostril once daily, Disp: 48 g, Rfl: 2   pantoprazole (PROTONIX) 40 MG tablet, Take 1 tablet by mouth once daily, Disp: 90 tablet, Rfl: 0   rosuvastatin (CRESTOR) 5 MG tablet, Take 1 tablet (5 mg total) by mouth daily., Disp: 90 tablet, Rfl: 3   venlafaxine XR (EFFEXOR XR) 37.5 MG 24 hr capsule, Take 1 capsule (37.5 mg total) by mouth daily with breakfast., Disp: 90 capsule, Rfl: 3  No Known Allergies  I personally reviewed active problem list, medication list, allergies, health maintenance, notes from last encounter, lab results with the patient/caregiver today.   Review of Systems  Constitutional:  Negative for chills, fever and weight loss.  Eyes:  Negative for blurred vision, double vision and photophobia.  Respiratory:  Negative for shortness of breath and wheezing.   Cardiovascular:  Negative for chest pain, palpitations and leg swelling.  Gastrointestinal:  Positive for heartburn. Negative for blood in stool, constipation, diarrhea, nausea and vomiting.  Neurological:  Positive for headaches (intermittent). Negative for dizziness, tingling and tremors.  Psychiatric/Behavioral:  Negative for depression. The patient is not nervous/anxious.        Objective  Vitals:   09/26/23 1524  BP: 124/82  Pulse: 75  Resp: 16  Temp: 98 F (36.7 C)  TempSrc: Oral  SpO2: 97%  Weight: 171 lb 4.8 oz (77.7 kg)  Height: 5' 3.75" (1.619 m)    Body mass index is 29.63 kg/m.  Physical Exam Vitals reviewed.  Constitutional:      General: She is awake.     Appearance: Normal appearance. She is  well-developed and well-groomed.  HENT:     Head: Normocephalic and atraumatic.  Cardiovascular:     Rate and Rhythm: Normal rate and regular rhythm.     Pulses: Normal pulses.          Radial pulses are 2+ on the right side and 2+ on the left side.     Heart sounds: Normal heart sounds. No murmur heard.    No friction rub. No gallop.  Pulmonary:     Effort: Pulmonary effort is normal.     Breath sounds: Normal breath sounds. No decreased air movement. No decreased breath sounds, wheezing, rhonchi or rales.  Musculoskeletal:        General: Normal range of motion.     Cervical back: Normal range of motion and neck supple.  Skin:    General: Skin is warm and dry.  Neurological:     General: No focal deficit present.     Mental Status: She is alert and oriented to person, place, and time. Mental status is at baseline.  Psychiatric:        Mood and Affect: Mood normal.        Behavior: Behavior normal. Behavior is cooperative.        Thought Content: Thought content normal.        Judgment: Judgment normal.      Recent Results (from the past 2160 hour(s))  Lipid Profile     Status: None   Collection Time: 09/26/23  3:55 PM  Result Value Ref Range   Cholesterol 167 <200 mg/dL   HDL 65 > OR = 50 mg/dL   Triglycerides 161 <096 mg/dL   LDL Cholesterol (Calc) 82 mg/dL (calc)    Comment: Reference range: <100 . Desirable range <100 mg/dL for primary prevention;   <70 mg/dL for patients with CHD or diabetic patients  with > or = 2 CHD risk factors. Marland Kitchen LDL-C is now calculated using the Martin-Hopkins  calculation, which is a  validated novel method providing  better accuracy than the Friedewald equation in the  estimation of LDL-C.  Horald Pollen et al. Lenox Ahr. 0454;098(11): 2061-2068  (http://education.[facility].com/faq/FAQ164)    Total CHOL/HDL Ratio 2.6 <5.0 (calc)   Non-HDL Cholesterol (Calc) 102 <130 mg/dL (calc)    Comment: For patients with diabetes plus 1 major ASCVD risk  factor, treating to a non-HDL-C goal of <100 mg/dL  (LDL-C of <91 mg/dL) is considered a therapeutic  option.   COMPLETE METABOLIC PANEL WITH GFR     Status: None   Collection Time: 09/26/23  3:55 PM  Result Value Ref Range   Glucose, Bld 84 65 - 99 mg/dL    Comment: .            Fasting reference interval .    BUN 15 7 - 25 mg/dL   Creat 4.78 2.95 - 6.21 mg/dL   eGFR 70 > OR = 60 HY/QMV/7.84O9   BUN/Creatinine Ratio SEE NOTE: 6 - 22 (calc)    Comment:    Not Reported: BUN and Creatinine are within    reference range. .    Sodium 141 135 - 146 mmol/L   Potassium 4.2 3.5 - 5.3 mmol/L   Chloride 104 98 - 110 mmol/L   CO2 30 20 - 32 mmol/L   Calcium 9.7 8.6 - 10.4 mg/dL   Total Protein 7.3 6.1 - 8.1 g/dL   Albumin 4.3 3.6 - 5.1 g/dL   Globulin 3.0 1.9 - 3.7 g/dL (calc)   AG  Ratio 1.4 1.0 - 2.5 (calc)   Total Bilirubin 0.7 0.2 - 1.2 mg/dL   Alkaline phosphatase (APISO) 44 37 - 153 U/L   AST 21 10 - 35 U/L   ALT 22 6 - 29 U/L  CBC w/Diff/Platelet     Status: None   Collection Time: 09/26/23  3:55 PM  Result Value Ref Range   WBC 7.5 3.8 - 10.8 Thousand/uL   RBC 3.83 3.80 - 5.10 Million/uL   Hemoglobin 12.3 11.7 - 15.5 g/dL   HCT 16.1 09.6 - 04.5 %   MCV 96.3 80.0 - 100.0 fL   MCH 32.1 27.0 - 33.0 pg   MCHC 33.3 32.0 - 36.0 g/dL    Comment: For adults, a slight decrease in the calculated MCHC value (in the range of 30 to 32 g/dL) is most likely not clinically significant; however, it should be interpreted with caution in correlation with other red cell parameters and the patient's clinical condition.    RDW  12.1 11.0 - 15.0 %   Platelets 269 140 - 400 Thousand/uL   MPV 11.2 7.5 - 12.5 fL   Neutro Abs 4,335 1,500 - 7,800 cells/uL   Absolute Lymphocytes 2,333 850 - 3,900 cells/uL   Absolute Monocytes 668 200 - 950 cells/uL   Eosinophils Absolute 113 15 - 500 cells/uL   Basophils Absolute 53 0 - 200 cells/uL   Neutrophils Relative % 57.8 %   Total Lymphocyte 31.1 %   Monocytes Relative 8.9 %   Eosinophils Relative 1.5 %   Basophils Relative 0.7 %  HgB A1c     Status: Abnormal   Collection Time: 09/26/23  3:55 PM  Result Value Ref Range   Hgb A1c MFr Bld 6.0 (H) <5.7 % of total Hgb    Comment: For someone without known diabetes, a hemoglobin  A1c value between 5.7% and 6.4% is consistent with prediabetes and should be confirmed with a  follow-up test. . For someone with known diabetes, a value <7% indicates that their diabetes is well controlled. A1c targets should be individualized based on duration of diabetes, age, comorbid conditions, and other considerations. . This assay result is consistent with an increased risk of diabetes. . Currently, no consensus exists regarding use of hemoglobin A1c for diagnosis of diabetes for children. .    Mean Plasma Glucose 126 mg/dL   eAG (mmol/L) 7.0 mmol/L  TSH     Status: None   Collection Time: 09/26/23  3:55 PM  Result Value Ref Range   TSH 1.69 0.40 - 4.50 mIU/L  T4     Status: None   Collection Time: 09/26/23  3:55 PM  Result Value Ref Range   T4, Total 7.1 5.1 - 11.9 mcg/dL     WUJ8/1:    19/14/7829    3:32 PM 03/27/2023    8:48 AM 01/30/2023    3:09 PM 12/15/2022    2:44 PM 03/23/2022    9:15 AM  Depression screen PHQ 2/9  Decreased Interest 0 0 0 0 0  Down, Depressed, Hopeless 0 0 0 0 0  PHQ - 2 Score 0 0 0 0 0  Altered sleeping 0 0 0 0 0  Tired, decreased energy 0 0 0 0 0  Change in appetite 0 0 0 0 0  Feeling bad or failure about yourself  0 0 0 0 0  Trouble concentrating 0 0 0 0 0  Moving slowly or  fidgety/restless 0 0 0 0 0  Suicidal thoughts 0  0 0 0 0  PHQ-9 Score 0 0 0 0 0  Difficult doing work/chores Not difficult at all Not difficult at all Not difficult at all Not difficult at all Not difficult at all      Fall Risk:    09/26/2023    3:23 PM 03/27/2023    8:48 AM 01/30/2023    3:09 PM 12/15/2022    2:44 PM 03/23/2022    9:14 AM  Fall Risk   Falls in the past year? 0 0 0 0 0  Number falls in past yr: 0 0 0 0 0  Injury with Fall? 0 0 0 0 0  Risk for fall due to : No Fall Risks No Fall Risks No Fall Risks No Fall Risks   Follow up Falls prevention discussed;Education provided;Falls evaluation completed Falls prevention discussed;Education provided;Falls evaluation completed Falls prevention discussed;Education provided;Falls evaluation completed Falls prevention discussed;Education provided;Falls evaluation completed       Functional Status Survey: Is the patient deaf or have difficulty hearing?: No Does the patient have difficulty seeing, even when wearing glasses/contacts?: No Does the patient have difficulty concentrating, remembering, or making decisions?: No Does the patient have difficulty walking or climbing stairs?: No Does the patient have difficulty dressing or bathing?: No Does the patient have difficulty doing errands alone such as visiting a doctor's office or shopping?: No    Assessment & Plan  Problem List Items Addressed This Visit       Digestive   Gastroesophageal reflux disease without esophagitis    Chronic, ongoing Appears stable with current regimen comprised of pantoprazole 40 mg p.o. daily, continue current regimen Follow-up as needed for progressing or persistent symptoms        Other   Mixed hyperlipidemia    Chronic, historic condition She is currently taking rosuvastatin 5 mg p.o. daily and appears to be tolerating well Recheck lipid panel today, results to dictate further management Continue current regimen for now Follow-up in 6  months or sooner if concerns arise      Relevant Orders   Lipid Profile (Completed)   Anxiety state    Chronic, historic condition Reviewed GAD-7 and PHQ-9 with her today-both minimal in severity She is currently taking venlafaxine 37.5 mg and appears to be tolerating well Continue current regimen Follow-up as needed for progressing or persistent symptoms or in 6 months      Overweight (BMI 25.0-29.9)    Chronic, ongoing Recommend dietary changes as well as increased exercise to assist with weight loss with goal of about 1 pound loss per week if tolerable Follow-up as needed      Relevant Orders   COMPLETE METABOLIC PANEL WITH GFR (Completed)   CBC w/Diff/Platelet (Completed)   TSH (Completed)   T4 (Completed)   Pre-diabetes - Primary    Chronic, ongoing Recheck A1c today, results to dictate further management      Relevant Orders   HgB A1c (Completed)   Other Visit Diagnoses     Need for influenza vaccination       Relevant Orders   Flu vaccine trivalent PF, 6mos and older(Flulaval,Afluria,Fluarix,Fluzone) (Completed)        No follow-ups on file.   I, Argusta Mcgann E Isael Stille, PA-C, have reviewed all documentation for this visit. The documentation on 09/27/23 for the exam, diagnosis, procedures, and orders are all accurate and complete.   Jacquelin Hawking, MHS, PA-C [facility]

## 2023-09-27 LAB — CBC WITH DIFFERENTIAL/PLATELET
Absolute Lymphocytes: 2333 {cells}/uL (ref 850–3900)
Absolute Monocytes: 668 {cells}/uL (ref 200–950)
Basophils Absolute: 53 {cells}/uL (ref 0–200)
Basophils Relative: 0.7 %
Eosinophils Absolute: 113 {cells}/uL (ref 15–500)
Eosinophils Relative: 1.5 %
HCT: 36.9 % (ref 35.0–45.0)
Hemoglobin: 12.3 g/dL (ref 11.7–15.5)
MCH: 32.1 pg (ref 27.0–33.0)
MCHC: 33.3 g/dL (ref 32.0–36.0)
MCV: 96.3 fL (ref 80.0–100.0)
MPV: 11.2 fL (ref 7.5–12.5)
Monocytes Relative: 8.9 %
Neutro Abs: 4335 {cells}/uL (ref 1500–7800)
Neutrophils Relative %: 57.8 %
Platelets: 269 10*3/uL (ref 140–400)
RBC: 3.83 10*6/uL (ref 3.80–5.10)
RDW: 12.1 % (ref 11.0–15.0)
Total Lymphocyte: 31.1 %
WBC: 7.5 10*3/uL (ref 3.8–10.8)

## 2023-09-27 LAB — HEMOGLOBIN A1C
Hgb A1c MFr Bld: 6 %{Hb} — ABNORMAL HIGH (ref <5.7)
Mean Plasma Glucose: 126 mg/dL
eAG (mmol/L): 7 mmol/L

## 2023-09-27 LAB — COMPLETE METABOLIC PANEL WITH GFR
AG Ratio: 1.4 (calc) (ref 1.0–2.5)
ALT: 22 U/L (ref 6–29)
AST: 21 U/L (ref 10–35)
Albumin: 4.3 g/dL (ref 3.6–5.1)
Alkaline phosphatase (APISO): 44 U/L (ref 37–153)
BUN: 15 mg/dL (ref 7–25)
CO2: 30 mmol/L (ref 20–32)
Calcium: 9.7 mg/dL (ref 8.6–10.4)
Chloride: 104 mmol/L (ref 98–110)
Creat: 0.95 mg/dL (ref 0.50–1.03)
Globulin: 3 g/dL (ref 1.9–3.7)
Glucose, Bld: 84 mg/dL (ref 65–99)
Potassium: 4.2 mmol/L (ref 3.5–5.3)
Sodium: 141 mmol/L (ref 135–146)
Total Bilirubin: 0.7 mg/dL (ref 0.2–1.2)
Total Protein: 7.3 g/dL (ref 6.1–8.1)
eGFR: 70 mL/min/{1.73_m2} (ref >=60)

## 2023-09-27 LAB — LIPID PANEL
Cholesterol: 167 mg/dL (ref <200)
HDL: 65 mg/dL (ref >=50)
LDL Cholesterol (Calc): 82 mg/dL
Non-HDL Cholesterol (Calc): 102 mg/dL (ref <130)
Total CHOL/HDL Ratio: 2.6 (calc) (ref <5.0)
Triglycerides: 105 mg/dL (ref <150)

## 2023-09-27 LAB — TSH: TSH: 1.69 m[IU]/L (ref 0.40–4.50)

## 2023-09-27 LAB — T4: T4, Total: 7.1 ug/dL (ref 5.1–11.9)

## 2023-09-27 NOTE — Assessment & Plan Note (Signed)
Chronic, ongoing Recheck A1c today, results to dictate further management

## 2023-09-27 NOTE — Assessment & Plan Note (Signed)
Chronic, historic condition Reviewed GAD-7 and PHQ-9 with her today-both minimal in severity She is currently taking venlafaxine 37.5 mg and appears to be tolerating well Continue current regimen Follow-up as needed for progressing or persistent symptoms or in 6 months

## 2023-09-27 NOTE — Assessment & Plan Note (Signed)
Chronic, historic condition She is currently taking rosuvastatin 5 mg p.o. daily and appears to be tolerating well Recheck lipid panel today, results to dictate further management Continue current regimen for now Follow-up in 6 months or sooner if concerns arise

## 2023-09-27 NOTE — Assessment & Plan Note (Signed)
Chronic, ongoing Appears stable with current regimen comprised of pantoprazole 40 mg p.o. daily, continue current regimen Follow-up as needed for progressing or persistent symptoms

## 2023-09-27 NOTE — Progress Notes (Signed)
Your labs are back Your A1c is staying stable at 6.0.  This is still in prediabetic range but medications are not indicated at this time Your cholesterol looks great, stable from last time.  Please continue your medications Your electrolytes, liver and kidney function all appear within normal limits Your CBC is overall normal, no signs of anemia Your thyroid testing was normal Please continue your current medication regimen as directed

## 2023-09-27 NOTE — Assessment & Plan Note (Signed)
Chronic, ongoing Recommend dietary changes as well as increased exercise to assist with weight loss with goal of about 1 pound loss per week if tolerable Follow-up as needed

## 2023-10-19 ENCOUNTER — Other Ambulatory Visit: Payer: Self-pay | Admitting: Family Medicine

## 2023-10-19 DIAGNOSIS — K219 Gastro-esophageal reflux disease without esophagitis: Secondary | ICD-10-CM

## 2024-01-15 ENCOUNTER — Other Ambulatory Visit: Payer: Self-pay | Admitting: Family Medicine

## 2024-01-15 DIAGNOSIS — K219 Gastro-esophageal reflux disease without esophagitis: Secondary | ICD-10-CM

## 2024-03-27 ENCOUNTER — Encounter: Payer: Self-pay | Admitting: Family Medicine

## 2024-03-27 ENCOUNTER — Ambulatory Visit: Payer: Self-pay | Admitting: Family Medicine

## 2024-03-27 VITALS — BP 126/74 | HR 83 | Resp 16 | Ht 63.0 in | Wt 177.0 lb

## 2024-03-27 DIAGNOSIS — F411 Generalized anxiety disorder: Secondary | ICD-10-CM | POA: Diagnosis not present

## 2024-03-27 DIAGNOSIS — Z0000 Encounter for general adult medical examination without abnormal findings: Secondary | ICD-10-CM

## 2024-03-27 DIAGNOSIS — Z1231 Encounter for screening mammogram for malignant neoplasm of breast: Secondary | ICD-10-CM | POA: Diagnosis not present

## 2024-03-27 DIAGNOSIS — K219 Gastro-esophageal reflux disease without esophagitis: Secondary | ICD-10-CM

## 2024-03-27 DIAGNOSIS — R2241 Localized swelling, mass and lump, right lower limb: Secondary | ICD-10-CM

## 2024-03-27 DIAGNOSIS — E782 Mixed hyperlipidemia: Secondary | ICD-10-CM | POA: Diagnosis not present

## 2024-03-27 DIAGNOSIS — Z780 Asymptomatic menopausal state: Secondary | ICD-10-CM

## 2024-03-27 MED ORDER — PANTOPRAZOLE SODIUM 40 MG PO TBEC: 40.0000 mg | DELAYED_RELEASE_TABLET | Freq: Every day | ORAL | 3 refills | Status: AC

## 2024-03-27 MED ORDER — VENLAFAXINE HCL ER 37.5 MG PO CP24: 37.5000 mg | ORAL_CAPSULE | Freq: Every day | ORAL | 3 refills | Status: AC

## 2024-03-27 MED ORDER — ROSUVASTATIN CALCIUM 5 MG PO TABS: 5.0000 mg | ORAL_TABLET | Freq: Every day | ORAL | 3 refills | Status: AC

## 2024-03-27 NOTE — Progress Notes (Signed)
 Patient: Stephanie Koch, Female    DOB: December 26, 1965, 58 y.o.   MRN: [id number] Danelle Berry, PA-C Visit Date: 03/27/2024  Today's Provider: Danelle Berry, PA-C   Chief Complaint  Patient presents with   Medical Management of Chronic Issues   Subjective:   Annual physical exam:  Stephanie Koch is a 58 y.o. female who presents today for complete physical exam:  Exercise/Activity:  active job and with watching granddaughter after work but not a lot of specidic exercise Diet/nutrition:no change to diet Sleep: no concers  SDOH Screenings   Food Insecurity: No Food Insecurity (03/24/2024)  Housing: Low Risk  (03/24/2024)  Transportation Needs: No Transportation Needs (03/24/2024)  Utilities: Not At Risk (03/27/2024)  Alcohol Screen: Low Risk  (03/27/2023)  Depression (PHQ2-9): Low Risk  (03/27/2024)  Financial Resource Strain: Low Risk  (03/24/2024)  Physical Activity: Inactive (03/24/2024)  Social Connections: Moderately Isolated (03/24/2024)  Stress: No Stress Concern Present (03/24/2024)  Tobacco Use: Low Risk  (03/27/2024)  Health Literacy: Adequate Health Literacy (03/27/2024)     Pt wished discuss acute complaints - abnormal toenail, bumps on feet starting to irritate her, allergies and pressure behind eyes, HA today  Needed refills of daily meds sent to new pharmacy- do routine f/up on chronic conditions today in addition to CPE. Advised pt of separate visit billing/coding  USPSTF grade A and B recommendations - reviewed and addressed today  Depression:  Phq 9 completed today by patient, was reviewed by me with patient in the room PHQ score is neg, pt feels good    03/27/2024    8:40 AM 09/26/2023    3:32 PM 03/27/2023    8:48 AM 01/30/2023    3:09 PM  PHQ 2/9 Scores  PHQ - 2 Score 0 0 0 0  PHQ- 9 Score  0 0 0      03/27/2024    8:40 AM 09/26/2023    3:32 PM 03/27/2023    8:48 AM 01/30/2023    3:09 PM 12/15/2022    2:44 PM  Depression screen PHQ 2/9  Decreased Interest  0 0 0 0 0  Down, Depressed, Hopeless 0 0 0 0 0  PHQ - 2 Score 0 0 0 0 0  Altered sleeping  0 0 0 0  Tired, decreased energy  0 0 0 0  Change in appetite  0 0 0 0  Feeling bad or failure about yourself   0 0 0 0  Trouble concentrating  0 0 0 0  Moving slowly or fidgety/restless  0 0 0 0  Suicidal thoughts  0 0 0 0  PHQ-9 Score  0 0 0 0  Difficult doing work/chores  Not difficult at all Not difficult at all Not difficult at all Not difficult at all    Alcohol screening: Flowsheet Row Office Visit from 03/27/2023 in [facility]  AUDIT-C Score 0       Immunizations and Health Maintenance: Health Maintenance  Topic Date Due   MAMMOGRAM  Never done   COVID-19 Vaccine (3 - 2024-25 season) 08/13/2023   INFLUENZA VACCINE  07/12/2024   DTaP/Tdap/Td (3 - Td or Tdap) 06/27/2027   Cervical Cancer Screening (HPV/Pap Cotest)  03/26/2028   Colonoscopy  03/06/2030   Hepatitis C Screening  Completed   HIV Screening  Completed   Zoster Vaccines- Shingrix  Completed   HPV VACCINES  Aged Out   Meningococcal B Vaccine  Aged Out     Hep C  Screening: done  STD testing and prevention (HIV/chl/gon/syphilis):  see above, no additional testing desired by pt today  Intimate partner violence:none, denies  Sexual History/Pain during Intercourse: Married  Menstrual History/LMP/Abnormal Bleeding: none, denies Patient's last menstrual period was 11/13/2010.  Incontinence Symptoms: denies  Breast cancer: due- ordered Last Mammogram: *see HM list above BRCA gene screening: n/a  Cervical cancer screening: UTD  Osteoporosis:   Discussion on osteoporosis per age, including high calcium and vitamin D supplementation, weight bearing exercises Pt is ? supplementing with daily calcium/Vit D. Due for Bone scan/dexa Roughly experienced menopause at age early to mid 93's  Skin cancer:  Hx of skin CA -  NO Discussed atypical lesions   Colorectal cancer:   Colonoscopy is  UTD   Discussed concerning signs and sx of CRC, pt denies melena, hematochezia, change in BM pattern or caliber   Lung cancer:   Low Dose CT Chest recommended if Age 89-80 years, 20 pack-year currently smoking OR have quit w/in 15years. Patient does not qualify.    Social History   Tobacco Use   Smoking status: Never    Passive exposure: Never   Smokeless tobacco: Never  Vaping Use   Vaping status: Never Used  Substance Use Topics   Alcohol use: No   Drug use: No     Flowsheet Row Office Visit from 03/27/2023 in [facility]  AUDIT-C Score 0       Family History  Problem Relation Age of Onset   Diabetes Mother    Hypertension Mother    Hyperlipidemia Mother    Stroke Father    Hypertension Father    Hyperlipidemia Father    Cancer Paternal Uncle        bowel   Diabetes Maternal Grandmother    Heart disease Maternal Grandmother    Congestive Heart Failure Maternal Grandmother    Cancer Maternal Grandfather        lung   Stroke Paternal Grandfather    Emphysema Paternal Grandmother    COPD Neg Hx      Blood pressure/Hypertension: BP Readings from Last 3 Encounters:  03/27/24 126/74  09/26/23 124/82  03/27/23 136/74    Weight/Obesity: Wt Readings from Last 3 Encounters:  03/27/24 177 lb (80.3 kg)  09/26/23 171 lb 4.8 oz (77.7 kg)  03/27/23 177 lb (80.3 kg)   BMI Readings from Last 3 Encounters:  03/27/24 31.35 kg/m  09/26/23 29.63 kg/m  03/27/23 30.62 kg/m     Lipids:  Lab Results  Component Value Date   CHOL 167 09/26/2023   CHOL 165 03/27/2023   CHOL 138 03/25/2022   Lab Results  Component Value Date   HDL 65 09/26/2023   HDL 61 03/27/2023   HDL 49 (L) 03/25/2022   Lab Results  Component Value Date   LDLCALC 82 09/26/2023   LDLCALC 83 03/27/2023   LDLCALC 70 03/25/2022   Lab Results  Component Value Date   TRIG 105 09/26/2023   TRIG 113 03/27/2023   TRIG 106 03/25/2022   Lab Results  Component Value  Date   CHOLHDL 2.6 09/26/2023   CHOLHDL 2.7 03/27/2023   CHOLHDL 2.8 03/25/2022   No results found for: "LDLDIRECT" Based on the results of lipid panel his/her cardiovascular risk factor ( using Poole Cohort )  in the next 10 years is: The 10-year ASCVD risk score (Arnett DK, et al., 2019) is: 1.5%   Values used to calculate the score:     Age:  57 years     Sex: Female     Is Non-Hispanic African American: No     Diabetic: No     Tobacco smoker: No     Systolic Blood Pressure: 126 mmHg     Is BP treated: No     HDL Cholesterol: 65 mg/dL     Total Cholesterol: 167 mg/dL  Glucose:  Glucose, Bld  Date Value Ref Range Status  09/26/2023 84 65 - 99 mg/dL Final    Comment:    .            Fasting reference interval .   03/27/2023 98 65 - 99 mg/dL Final    Comment:    .            Fasting reference interval .   12/16/2022 186 (H) 70 - 99 mg/dL Final    Comment:    Glucose reference range applies only to samples taken after fasting for at least 8 hours.    Advanced Care Planning:  A voluntary discussion about advance care planning including the explanation and discussion of advance directives.   Discussed health care proxy and Living will, and the patient was able to identify a health care proxy as spouse.     Social History       Social History   Socioeconomic History   Marital status: Married    Spouse name: Benny   Number of children: 1   Years of education: 13   Highest education level: Associate degree: academic program  Occupational History   Not on file  Tobacco Use   Smoking status: Never    Passive exposure: Never   Smokeless tobacco: Never  Vaping Use   Vaping status: Never Used  Substance and Sexual Activity   Alcohol use: No   Drug use: No   Sexual activity: Yes    Birth control/protection: Post-menopausal  Other Topics Concern   Not on file  Social History Narrative   Not on file   Social Drivers of Health   Financial Resource Strain: Low  Risk  (03/24/2024)   Overall Financial Resource Strain (CARDIA)    Difficulty of Paying Living Expenses: Not hard at all  Food Insecurity: No Food Insecurity (03/24/2024)   Hunger Vital Sign    Worried About Running Out of Food in the Last Year: Never true    Ran Out of Food in the Last Year: Never true  Transportation Needs: No Transportation Needs (03/24/2024)   PRAPARE - Administrator, Civil Service (Medical): No    Lack of Transportation (Non-Medical): No  Physical Activity: Inactive (03/24/2024)   Exercise Vital Sign    Days of Exercise per Week: 0 days    Minutes of Exercise per Session: 0 min  Stress: No Stress Concern Present (03/24/2024)   [employer] of Occupational Health - Occupational Stress Questionnaire    Feeling of Stress : Not at all  Social Connections: Moderately Isolated (03/24/2024)   Social Connection and Isolation Panel [NHANES]    Frequency of Communication with Friends and Family: More than three times a week    Frequency of Social Gatherings with Friends and Family: More than three times a week    Attends Religious Services: Never    Database administrator or Organizations: No    Attends Banker Meetings: Never    Marital Status: Married    Family History        Family History  Problem Relation  Age of Onset   Diabetes Mother    Hypertension Mother    Hyperlipidemia Mother    Stroke Father    Hypertension Father    Hyperlipidemia Father    Cancer Paternal Uncle        bowel   Diabetes Maternal Grandmother    Heart disease Maternal Grandmother    Congestive Heart Failure Maternal Grandmother    Cancer Maternal Grandfather        lung   Stroke Paternal Grandfather    Emphysema Paternal Grandmother    COPD Neg Hx     Patient Active Problem List   Diagnosis Date Noted   Pre-diabetes 03/23/2022   Gastroesophageal reflux disease without esophagitis 03/23/2022   Seasonal allergic rhinitis 05/26/2015   Mixed  hyperlipidemia 05/26/2015   Anxiety state 05/26/2015   Menopausal state 05/26/2015   Overweight (BMI 25.0-29.9) 05/26/2015   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 09/12/2013    Past Surgical History:  Procedure Laterality Date   APPENDECTOMY  12/15/2022   COLONOSCOPY WITH PROPOFOL N/A 03/06/2020   Procedure: COLONOSCOPY WITH PROPOFOL;  Surgeon: [name], MD;  Location: [facility] ENDOSCOPY;  Service: Gastroenterology;  Laterality: N/A;   ESOPHAGOGASTRODUODENOSCOPY (EGD) WITH PROPOFOL N/A 03/06/2020   Procedure: ESOPHAGOGASTRODUODENOSCOPY (EGD) WITH PROPOFOL;  Surgeon: [name], MD;  Location: [facility] ENDOSCOPY;  Service: Gastroenterology;  Laterality: N/A;   LAPAROSCOPIC APPENDECTOMY N/A 12/15/2022   Procedure: LAPAROSCOPIC CECECTOMY;  Surgeon: Leafy Ro, MD;  Location: [facility] ORS;  Service: General;  Laterality: N/A;     Current Outpatient Medications:    Calcium Carbonate-Vit D-Min (CALCIUM 1200 PO), Take by mouth., Disp: , Rfl:    cholecalciferol (VITAMIN D) 1000 units tablet, Take 1,000 Units by mouth daily., Disp: , Rfl:    fluticasone (FLONASE) 50 MCG/ACT nasal spray, Place 2 sprays into both nostrils daily. Use 2 spray(s) in each nostril once daily, Disp: 48 g, Rfl: 2   pantoprazole (PROTONIX) 40 MG tablet, Take 1 tablet (40 mg total) by mouth daily., Disp: 90 tablet, Rfl: 3   rosuvastatin (CRESTOR) 5 MG tablet, Take 1 tablet (5 mg total) by mouth daily., Disp: 90 tablet, Rfl: 3   venlafaxine XR (EFFEXOR XR) 37.5 MG 24 hr capsule, Take 1 capsule (37.5 mg total) by mouth daily with breakfast., Disp: 90 capsule, Rfl: 3  No Known Allergies  Patient Care Team: Danelle Berry, PA-C as PCP - General (Family Medicine) Lada, Janit Bern, MD as PCP - Family Medicine (Family Medicine) Oh, Ezzard Standing, MD (Inactive) as Consulting Physician (Gastroenterology)   Chart Review: I personally reviewed active problem list, medication list, allergies, family history, social history,  health maintenance, notes from last encounter, lab results, imaging with the patient/caregiver today.   Review of Systems  Constitutional: Negative.   HENT: Negative.    Eyes: Negative.   Respiratory: Negative.    Cardiovascular: Negative.   Gastrointestinal: Negative.   Endocrine: Negative.   Genitourinary: Negative.   Musculoskeletal: Negative.   Skin: Negative.   Allergic/Immunologic: Negative.   Neurological: Negative.   Hematological: Negative.   Psychiatric/Behavioral: Negative.    All other systems reviewed and are negative.         Objective:   Vitals:  Vitals:   03/27/24 0832  BP: 126/74  Pulse: 83  Resp: 16  SpO2: 97%  Weight: 177 lb (80.3 kg)  Height: 5\' 3"  (1.6 m)    Body mass index is 31.35 kg/m.  Physical Exam Vitals and nursing note reviewed.  Constitutional:      General: She is not in acute distress.    Appearance: Normal appearance. She is well-developed and overweight. She is not ill-appearing, toxic-appearing or diaphoretic.  HENT:     Head: Normocephalic and atraumatic.     Right Ear: Ear canal and external ear normal. There is impacted cerumen.     Left Ear: Ear canal and external ear normal. There is impacted cerumen.     Nose: Congestion and rhinorrhea present. Rhinorrhea is clear.     Right Turbinates: Enlarged.     Left Turbinates: Enlarged.     Right Sinus: No maxillary sinus tenderness or frontal sinus tenderness.     Left Sinus: No maxillary sinus tenderness or frontal sinus tenderness.     Mouth/Throat:     Mouth: Mucous membranes are moist.     Pharynx: Oropharynx is clear. No oropharyngeal exudate or posterior oropharyngeal erythema.  Eyes:     General: Lids are normal. No scleral icterus.       Right eye: No discharge.        Left eye: No discharge.     Conjunctiva/sclera: Conjunctivae normal.  Neck:     Trachea: Phonation normal. No tracheal deviation.  Cardiovascular:     Rate and Rhythm: Normal rate and regular rhythm.      Pulses: Normal pulses.          Radial pulses are 2+ on the right side and 2+ on the left side.       Posterior tibial pulses are 2+ on the right side and 2+ on the left side.     Heart sounds: Normal heart sounds. No murmur heard.    No friction rub. No gallop.  Pulmonary:     Effort: Pulmonary effort is normal. No respiratory distress.     Breath sounds: Normal breath sounds. No stridor. No wheezing, rhonchi or rales.  Chest:     Chest wall: No tenderness.  Abdominal:     General: Bowel sounds are normal. There is no distension.     Palpations: Abdomen is soft.     Tenderness: There is no abdominal tenderness. There is no guarding or rebound.  Musculoskeletal:     Cervical back: Normal range of motion.     Right lower leg: No edema.     Left lower leg: No edema.     Comments: Right foot firm nodule/bump to top of foot, no fluctuance, no erythema  Lymphadenopathy:     Cervical: No cervical adenopathy.  Skin:    General: Skin is warm and dry.     Capillary Refill: Capillary refill takes less than 2 seconds.     Coloration: Skin is not jaundiced or pale.     Findings: No rash.  Neurological:     Mental Status: She is alert and oriented to person, place, and time.     Motor: No abnormal muscle tone.     Gait: Gait normal.  Psychiatric:        Mood and Affect: Mood normal.        Speech: Speech normal.        Behavior: Behavior normal. Behavior is cooperative.       Fall Risk:    03/27/2024    8:41 AM 09/26/2023    3:23 PM 03/27/2023    8:48 AM 01/30/2023    3:09 PM 12/15/2022    2:44 PM  Fall Risk   Falls in the past year? 0 0 0 0 0  Number falls in past yr: 0 0 0 0 0  Injury with Fall? 0 0 0 0 0  Risk for fall due to : No Fall Risks No Fall Risks No Fall Risks No Fall Risks No Fall Risks  Follow up Falls prevention discussed Falls prevention discussed;Education provided;Falls evaluation completed Falls prevention discussed;Education provided;Falls evaluation  completed Falls prevention discussed;Education provided;Falls evaluation completed Falls prevention discussed;Education provided;Falls evaluation completed    Functional Status Survey:     Assessment & Plan:    CPE completed today  USPSTF grade A and B recommendations reviewed with patient; age-appropriate recommendations, preventive care, screening tests, etc discussed and encouraged; healthy living encouraged; see AVS for patient education given to patient  Discussed importance of 150 minutes of physical activity weekly, AHA exercise recommendations given to pt in AVS/handout  Discussed importance of healthy diet:  eating lean meats and proteins, avoiding trans fats and saturated fats, avoid simple sugars and excessive carbs in diet, eat 6 servings of fruit/vegetables daily and drink plenty of water and avoid sweet beverages.    Recommended pt to do annual eye exam and routine dental exams/cleanings  Depression, alcohol, fall screening completed as documented above and per flowsheets  Advance Care planning information and packet discussed and offered today, encouraged pt to discuss with family members/spouse/partner/friends and complete Advanced directive packet and bring copy to office   Reviewed Health Maintenance: Health Maintenance  Topic Date Due   MAMMOGRAM  Never done   COVID-19 Vaccine (3 - 2024-25 season) 08/13/2023   INFLUENZA VACCINE  07/12/2024   DTaP/Tdap/Td (3 - Td or Tdap) 06/27/2027   Cervical Cancer Screening (HPV/Pap Cotest)  03/26/2028   Colonoscopy  03/06/2030   Hepatitis C Screening  Completed   HIV Screening  Completed   Zoster Vaccines- Shingrix  Completed   HPV VACCINES  Aged Out   Meningococcal B Vaccine  Aged Out    Immunizations: Immunization History  Administered Date(s) Administered   Influenza, Seasonal, Injecte, Preservative Fre 09/26/2023   Influenza,inj,Quad PF,6+ Mos 08/15/2019, 09/24/2020   Influenza-Unspecified 10/12/2022   Moderna  Sars-Covid-2 Vaccination 02/08/2020, 03/14/2020   PPD Test 06/26/2017   Tdap 07/12/2010, 06/26/2017   Zoster Recombinant(Shingrix) 08/12/2021, 09/13/2021       ICD-10-CM   1. Adult general medical exam  Z00.00 CBC with Differential/Platelet    Comprehensive metabolic panel with GFR    Hemoglobin A1c    Lipid panel    2. Breast cancer screening by mammogram  Z12.31 MM 3D SCREENING MAMMOGRAM BILATERAL BREAST    3. Anxiety state  F41.1 venlafaxine XR (EFFEXOR XR) 37.5 MG 24 hr capsule   mood good, refill on meds    4. Mixed hyperlipidemia  E78.2 rosuvastatin (CRESTOR) 5 MG tablet   on crestor 5, tolerating, good compliance, labs done in oct and adequate response after starting statin, refills ordered, f/up 6-12 months    5. Gastroesophageal reflux disease without esophagitis  K21.9 pantoprazole (PROTONIX) 40 MG tablet   chronic with hiatal hernia and past concer for Barretts she is est with GI, has done 2 EGDs, sx controlled with PPI daily    6. Postmenopausal estrogen deficiency  Z78.0 DG Bone Density    7. Nodule of skin of right foot  R22.41    dorsal firm nodule/mass, no tenderness or redness today - offered podiatry consult      Return in about 6 months (around 09/26/2024) for Routine follow-up.     Adeline Hone, PA-C 03/27/24 9:19 AM  [facility]

## 2024-03-27 NOTE — Patient Instructions (Signed)
 Call to schedule your mammogram -  [facility] [address], [place], [place] [address] Scheduling phone #: [phone]  Health Maintenance  Topic Date Due   Mammogram  Never done   COVID-19 Vaccine (3 - 2024-25 season) 08/13/2023   Flu Shot  07/12/2024   DTaP/Tdap/Td vaccine (3 - Td or Tdap) 06/27/2027   Pap with HPV screening  03/26/2028   Colon Cancer Screening  03/06/2030   Hepatitis C Screening  Completed   HIV Screening  Completed   Zoster (Shingles) Vaccine  Completed   HPV Vaccine  Aged Out   Meningitis B Vaccine  Aged Out

## 2024-03-28 ENCOUNTER — Encounter: Payer: Self-pay | Admitting: Family Medicine

## 2024-03-28 LAB — HEMOGLOBIN A1C
Hgb A1c MFr Bld: 6 % — ABNORMAL HIGH (ref <5.7)
Mean Plasma Glucose: 126 mg/dL
eAG (mmol/L): 7 mmol/L

## 2024-03-28 LAB — CBC WITH DIFFERENTIAL/PLATELET
Absolute Lymphocytes: 1836 {cells}/uL (ref 850–3900)
Absolute Monocytes: 606 {cells}/uL (ref 200–950)
Basophils Absolute: 30 {cells}/uL (ref 0–200)
Basophils Relative: 0.5 %
Eosinophils Absolute: 120 {cells}/uL (ref 15–500)
Eosinophils Relative: 2 %
HCT: 37.5 % (ref 35.0–45.0)
Hemoglobin: 12.8 g/dL (ref 11.7–15.5)
MCH: 32 pg (ref 27.0–33.0)
MCHC: 34.1 g/dL (ref 32.0–36.0)
MCV: 93.8 fL (ref 80.0–100.0)
MPV: 11.3 fL (ref 7.5–12.5)
Monocytes Relative: 10.1 %
Neutro Abs: 3408 {cells}/uL (ref 1500–7800)
Neutrophils Relative %: 56.8 %
Platelets: 252 10*3/uL (ref 140–400)
RBC: 4 10*6/uL (ref 3.80–5.10)
RDW: 12.4 % (ref 11.0–15.0)
Total Lymphocyte: 30.6 %
WBC: 6 10*3/uL (ref 3.8–10.8)

## 2024-03-28 LAB — LIPID PANEL
Cholesterol: 159 mg/dL (ref <200)
HDL: 58 mg/dL (ref >=50)
LDL Cholesterol (Calc): 82 mg/dL
Non-HDL Cholesterol (Calc): 101 mg/dL (ref <130)
Total CHOL/HDL Ratio: 2.7 (calc) (ref <5.0)
Triglycerides: 96 mg/dL (ref <150)

## 2024-03-28 LAB — COMPREHENSIVE METABOLIC PANEL WITH GFR
AG Ratio: 1.7 (calc) (ref 1.0–2.5)
ALT: 20 U/L (ref 6–29)
AST: 18 U/L (ref 10–35)
Albumin: 4.5 g/dL (ref 3.6–5.1)
Alkaline phosphatase (APISO): 40 U/L (ref 37–153)
BUN: 17 mg/dL (ref 7–25)
CO2: 28 mmol/L (ref 20–32)
Calcium: 9.3 mg/dL (ref 8.6–10.4)
Chloride: 104 mmol/L (ref 98–110)
Creat: 0.79 mg/dL (ref 0.50–1.03)
Globulin: 2.7 g/dL (ref 1.9–3.7)
Glucose, Bld: 93 mg/dL (ref 65–99)
Potassium: 4 mmol/L (ref 3.5–5.3)
Sodium: 141 mmol/L (ref 135–146)
Total Bilirubin: 1 mg/dL (ref 0.2–1.2)
Total Protein: 7.2 g/dL (ref 6.1–8.1)
eGFR: 88 mL/min/{1.73_m2} (ref >=60)

## 2024-04-02 ENCOUNTER — Encounter: Payer: Self-pay | Admitting: Family Medicine

## 2024-04-04 MED ORDER — TERBINAFINE HCL 250 MG PO TABS: 250.0000 mg | ORAL_TABLET | Freq: Every day | ORAL | 1 refills | Status: DC

## 2024-04-04 MED ORDER — CICLOPIROX 8 % EX SOLN: Freq: Every day | CUTANEOUS | 0 refills | Status: AC

## 2024-05-14 ENCOUNTER — Other Ambulatory Visit: Payer: Self-pay | Admitting: Family Medicine

## 2024-05-14 DIAGNOSIS — J301 Allergic rhinitis due to pollen: Secondary | ICD-10-CM

## 2024-05-14 NOTE — Telephone Encounter (Signed)
 Requested Prescriptions  Pending Prescriptions Disp Refills   fluticasone  (FLONASE ) 50 MCG/ACT nasal spray [Pharmacy Med Name: FLUTICASONE  PROP 50 MCG SPRAY] 48 mL 0    Sig: USE 2 SPRAYS IN EACH NOSTRIL ONCE DAILY     Ear, Nose, and Throat: Nasal Preparations - Corticosteroids Passed - 05/14/2024  4:53 PM      Passed - Valid encounter within last 12 months    Recent Outpatient Visits           1 month ago Adult general medical exam   [facility] Adeline Hone, PA-C

## 2024-06-05 ENCOUNTER — Ambulatory Visit: Admitting: Family Medicine

## 2024-06-07 ENCOUNTER — Ambulatory Visit: Admitting: Family Medicine

## 2024-06-11 ENCOUNTER — Ambulatory Visit: Admitting: Family Medicine

## 2024-06-11 ENCOUNTER — Encounter: Payer: Self-pay | Admitting: Family Medicine

## 2024-06-11 VITALS — BP 122/68 | HR 76 | Resp 16 | Ht 63.0 in | Wt 178.0 lb

## 2024-06-11 DIAGNOSIS — B351 Tinea unguium: Secondary | ICD-10-CM | POA: Diagnosis not present

## 2024-06-11 DIAGNOSIS — R2241 Localized swelling, mass and lump, right lower limb: Secondary | ICD-10-CM

## 2024-06-11 NOTE — Progress Notes (Signed)
 Patient ID: Stephanie Koch, female    DOB: Apr 03, 1966, 58 y.o.   MRN: [id number]  PCP: Leavy Mole, PA-C  Chief Complaint  Patient presents with   Follow-up    Reassess right toe nodule. No pain.    Subjective:   Stephanie Koch is a 58 y.o. female, presents to clinic with CC of the following:  HPI   Right great toe nail discomfort is better and some of the thickness is getting a little better but nail is still too thick for her to cut and right 2nd toe looks like its starting to have some fungal disease She's taken lamisil  250 mg daily for 9+ weeks  Right nodule on doral right foot  It is uncomfortable when she works and wears shoes and has slowly gotten bigger over time Also wants to see podaitry for that   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 09/12/2013      Current Outpatient Medications:    Calcium  Carbonate-Vit D-Min (CALCIUM  1200 PO), Take by mouth., Disp: , Rfl:    cholecalciferol (VITAMIN D ) 1000 units tablet, Take 1,000 Units by mouth daily., Disp: , Rfl:    ciclopirox  (PENLAC ) 8 % solution, Apply topically at bedtime. Apply over nail and surrounding skin. Apply daily over previous coat. After seven (7) days, may remove with alcohol and continue cycle., Disp: 6.6 mL, Rfl: 0   fluticasone  (FLONASE ) 50 MCG/ACT nasal spray, USE 2 SPRAYS IN EACH NOSTRIL ONCE DAILY, Disp: 48 mL, Rfl: 0   pantoprazole  (PROTONIX ) 40 MG tablet, Take 1 tablet (40 mg total) by mouth daily., Disp: 90 tablet, Rfl: 3   rosuvastatin  (CRESTOR ) 5 MG tablet, Take 1 tablet (5 mg total) by mouth daily., Disp: 90 tablet, Rfl: 3   terbinafine  (LAMISIL ) 250 MG  tablet, Take 1 tablet (250 mg total) by mouth daily., Disp: 45 tablet, Rfl: 1   venlafaxine  XR (EFFEXOR  XR) 37.5 MG 24 hr capsule, Take 1 capsule (37.5 mg total) by mouth daily with breakfast., Disp: 90 capsule, Rfl: 3   No Known Allergies   Social History   Tobacco Use   Smoking status: Never    Passive exposure: Never   Smokeless tobacco: Never  Vaping Use   Vaping status: Never Used  Substance Use Topics   Alcohol use: No   Drug use: No      Chart Review Today: I personally reviewed active problem list, medication list, allergies, family history, social history, health maintenance, notes from last encounter, lab results, imaging with the patient/caregiver today.   Review of Systems  All other systems reviewed and are negative.      Objective:   Vitals:   06/11/24 1335  BP: 122/68  Pulse: 76  Resp: 16  SpO2: 98%  Weight: 178 lb (80.7 kg)  Height: 5' 3 (1.6 m)    Body mass index is 31.53 kg/m.  Physical Exam Vitals and nursing note reviewed.  Constitutional:      General: She is not in acute distress.    Appearance: Normal appearance. She is well-developed. She is not ill-appearing, toxic-appearing or diaphoretic.  HENT:     Head: Normocephalic and atraumatic.  Right Ear: External ear normal.     Left Ear: External ear normal.     Nose: Nose normal.   Eyes:     General: No scleral icterus.       Right eye: No discharge.        Left eye: No discharge.     Conjunctiva/sclera: Conjunctivae normal.   Neck:     Trachea: No tracheal deviation.   Cardiovascular:     Rate and Rhythm: Normal rate.  Pulmonary:     Effort: Pulmonary effort is normal. No respiratory distress.     Breath sounds: No stridor.  Feet:     Right foot:     Skin integrity: Skin integrity normal. No erythema or warmth.     Toenail Condition: Right toenails are abnormally thick. Fungal disease present.    Comments: Dorsal right mid foot roughly 2 cm firm nodule   Skin:     General: Skin is warm and dry.     Findings: No rash.   Neurological:     Mental Status: She is alert.     Motor: No abnormal muscle tone.     Coordination: Coordination normal.     Gait: Gait normal.   Psychiatric:        Mood and Affect: Mood normal.        Behavior: Behavior normal.      Results for orders placed or performed in visit on 03/27/24  CBC with Differential/Platelet   Collection Time: 03/27/24  9:18 AM  Result Value Ref Range   WBC 6.0 3.8 - 10.8 Thousand/uL   RBC 4.00 3.80 - 5.10 Million/uL   Hemoglobin 12.8 11.7 - 15.5 g/dL   HCT 62.4 64.9 - 54.9 %   MCV 93.8 80.0 - 100.0 fL   MCH 32.0 27.0 - 33.0 pg   MCHC 34.1 32.0 - 36.0 g/dL   RDW 87.5 88.9 - 84.9 %   Platelets 252 140 - 400 Thousand/uL   MPV 11.3 7.5 - 12.5 fL   Neutro Abs 3,408 1,500 - 7,800 cells/uL   Absolute Lymphocytes 1,836 850 - 3,900 cells/uL   Absolute Monocytes 606 200 - 950 cells/uL   Eosinophils Absolute 120 15 - 500 cells/uL   Basophils Absolute 30 0 - 200 cells/uL   Neutrophils Relative % 56.8 %   Total Lymphocyte 30.6 %   Monocytes Relative 10.1 %   Eosinophils Relative 2.0 %   Basophils Relative 0.5 %  Comprehensive metabolic panel with GFR   Collection Time: 03/27/24  9:18 AM  Result Value Ref Range   Glucose, Bld 93 65 - 99 mg/dL   BUN 17 7 - 25 mg/dL   Creat 9.20 9.49 - 8.96 mg/dL   eGFR 88 > OR = 60 fO/fpw/8.26f7   BUN/Creatinine Ratio SEE NOTE: 6 - 22 (calc)   Sodium 141 135 - 146 mmol/L   Potassium 4.0 3.5 - 5.3 mmol/L   Chloride 104 98 - 110 mmol/L   CO2 28 20 - 32 mmol/L   Calcium  9.3 8.6 - 10.4 mg/dL   Total Protein 7.2 6.1 - 8.1 g/dL   Albumin 4.5 3.6 - 5.1 g/dL   Globulin 2.7 1.9 - 3.7 g/dL (calc)   AG Ratio 1.7 1.0 - 2.5 (calc)   Total Bilirubin 1.0 0.2 - 1.2 mg/dL   Alkaline phosphatase (APISO) 40 37 - 153 U/L   AST 18 10 - 35 U/L   ALT 20 6 - 29 U/L  Hemoglobin A1c   Collection  Time: 03/27/24  9:18 AM  Result Value Ref Range   Hgb A1c MFr Bld 6.0 (H)  <5.7 %   Mean Plasma Glucose 126 mg/dL   eAG (mmol/L) 7.0 mmol/L  Lipid panel   Collection Time: 03/27/24  9:18 AM  Result Value Ref Range   Cholesterol 159 <200 mg/dL   HDL 58 > OR = 50 mg/dL   Triglycerides 96 <849 mg/dL   LDL Cholesterol (Calc) 82 mg/dL (calc)   Total CHOL/HDL Ratio 2.7 <5.0 (calc)   Non-HDL Cholesterol (Calc) 101 <130 mg/dL (calc)       Assessment & Plan:     ICD-10-CM   1. Fungal infection of nail  B35.1 Ambulatory referral to Podiatry    CANCELED: Ambulatory referral to Podiatry    CANCELED: Ambulatory referral to Podiatry    2. Nodule of skin of right foot  R22.41 Ambulatory referral to Podiatry    CANCELED: Ambulatory referral to Podiatry     Continue penlac  and oral lamisil  until seeing podiatry, she is about week 9 of lamisil  with some improvement   Keep next CPE with PCP here next April   Michelene Cower, PA-C 06/11/24 2:09 PM

## 2024-06-25 ENCOUNTER — Ambulatory Visit: Admitting: Podiatry

## 2024-06-25 ENCOUNTER — Ambulatory Visit (INDEPENDENT_AMBULATORY_CARE_PROVIDER_SITE_OTHER)

## 2024-06-25 VITALS — Ht 63.0 in | Wt 178.0 lb

## 2024-06-25 DIAGNOSIS — M898X7 Other specified disorders of bone, ankle and foot: Secondary | ICD-10-CM

## 2024-06-25 DIAGNOSIS — M7751 Other enthesopathy of right foot: Secondary | ICD-10-CM

## 2024-06-25 DIAGNOSIS — Z79899 Other long term (current) drug therapy: Secondary | ICD-10-CM | POA: Diagnosis not present

## 2024-06-25 MED ORDER — TERBINAFINE HCL 250 MG PO TABS: 250.0000 mg | ORAL_TABLET | Freq: Every day | ORAL | 0 refills | Status: AC

## 2024-06-25 NOTE — Progress Notes (Unsigned)
 Chief Complaint  Patient presents with   Nail Problem    Right great toenail fungus was seen by PCP for this issue was put on Lamisil  and given cream to put on it, had not help the issue.   Foot Pain    Pt is here due to right foot pain she has a knot on the top of her right foot has been there for 4-5 months, the last month has gotten bigger and hurts more, states certain shoes rubs against it.    HPI: 58 y.o. female presenting today as a new patient for 2 separate complaints.  First the patient states that she has had pain and tenderness to the dorsum of the right foot for about the last 4 to 5 months.  Gradual onset.  She has tried different shoe gear modifications but it continues to be painful and rub against all of her shoes.  She is presenting for management and evaluation  Also has a history of fungal nail infection.  She has recently been placed on oral Lamisil  and provided ciclopirox  topical antifungal.  She has only noticed minimal improvement over the last few months.  Past Medical History:  Diagnosis Date   Abnormal Pap smear of cervix 08/2013   ASCUS pap with HR HPV Neg. Needs repeat pap with cotesting 2017.   Acute appendicitis with localized peritonitis, without perforation, abscess, or gangrene 12/15/2022   Allergy    Anxiety    Appendicitis 12/15/2022   Barrett's esophagus 07/2010   EGD with biopsy confirmed   GERD (gastroesophageal reflux disease)    Hyperlipidemia    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 09/12/2013   HPV negative; repeat April 2017 NIL, negative HPV   Right lower quadrant abdominal pain 12/15/2022    Past Surgical History:  Procedure Laterality Date   APPENDECTOMY  12/15/2022   COLONOSCOPY WITH PROPOFOL  N/A 03/06/2020   Procedure: COLONOSCOPY WITH PROPOFOL ;  Surgeon: Therisa Bi, MD;  Location: [facility] ENDOSCOPY;  Service: Gastroenterology;  Laterality: N/A;   ESOPHAGOGASTRODUODENOSCOPY (EGD) WITH PROPOFOL  N/A 03/06/2020    Procedure: ESOPHAGOGASTRODUODENOSCOPY (EGD) WITH PROPOFOL ;  Surgeon: Therisa Bi, MD;  Location: [facility] ENDOSCOPY;  Service: Gastroenterology;  Laterality: N/A;   LAPAROSCOPIC APPENDECTOMY N/A 12/15/2022   Procedure: LAPAROSCOPIC CECECTOMY;  Surgeon: Jordis Laneta FALCON, MD;  Location: [facility] ORS;  Service: General;  Laterality: N/A;    No Known Allergies     RT foot 06/25/2024   Physical Exam: General: The patient is alert and oriented x3 in no acute distress.  Dermatology: Skin is warm, dry and supple bilateral lower extremities.  Hyperkeratotic dystrophic nails noted 1-5 bilateral  Vascular: Palpable pedal pulses bilaterally. Capillary refill within normal limits.  No appreciable edema.  No erythema.  Neurological: Grossly intact via light touch  Musculoskeletal Exam: Palpable hard osseous lesion noted to the dorsum of the right midfoot with associated tenderness to palpation.  Please see above noted photo  Radiographic Exam RT foot 06/25/2024:  Normal osseous mineralization. Joint spaces preserved.  No fractures or osseous irregularities noted.  Prominence noted to the dorsum of the midtarsal joint right foot.  Assessment/Plan of Care: 1.  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  Fungal nail infection  -Patient evaluated.  X-rays reviewed -Given the painful symptomatic exostosis noted to the dorsum of the right foot I do recommend partial excision of the foot bones/exostectomy to alleviate pressure from any shoes that rub the foot.  Unfortunately it is affecting her  on a daily basis and she has tried multiple different shoe gear modifications and different shoes without any relief of symptoms.  It is painful on a daily basis.  Risk benefits advantages and disadvantages of the procedure were explained in length in detail to the patient.  No guarantees were expressed or implied.  Operative recovery course was also explained.  The patient would like to pursue surgery  at this time. -Authorization for surgery was initiated at this time.  Surgery will consist of exostectomy right foot  -Today we discussed the pathology and etiology of fungal nail infection.  Discussed different treatment modalities and the relative efficacies as well as risks and benefits associated to each modality.  After discussion with the patient she would like to continue to pursue the oral Lamisil .  Order placed for hepatic function panel.  Discontinue if abnormal -In regards to the chronic toenail infection, refill prescription for Lamisil  250 mg #90 daily. -Patient is requesting a pedicure.  She may discontinue the ciclopirox  topical antifungal medication with -Return to clinic 1 week postop       Thresa EMERSON Sar, DPM [facility]  Dr. Thresa EMERSON Sar, DPM    [address] [place], [place] [address]                Office [phone]  Fax [phone]      RT exostectomy sx  Toenail fungus - refill lamisil . DC ciclopirox  bc she wants pedicure  *Emergency planning/management officer

## 2024-06-26 ENCOUNTER — Other Ambulatory Visit: Payer: Self-pay | Admitting: Family Medicine

## 2024-06-27 ENCOUNTER — Telehealth: Payer: Self-pay | Admitting: Podiatry

## 2024-06-27 NOTE — Telephone Encounter (Signed)
 Received surgical consent forms  Lvm for pt to call to schedule her surgery

## 2024-06-27 NOTE — Telephone Encounter (Signed)
 Requested Prescriptions  Refused Prescriptions Disp Refills   terbinafine  (LAMISIL ) 250 MG tablet [Pharmacy Med Name: TERBINAFINE  HCL 250 MG TABLET] 45 tablet 1    Sig: TAKE 1 TABLET BY MOUTH EVERY DAY     Off-Protocol Failed - 06/27/2024 12:28 PM      Failed - Medication not assigned to a protocol, review manually.      Passed - Valid encounter within last 12 months    Recent Outpatient Visits           2 weeks ago Fungal infection of nail   [facility] Leavy Mole, PA-C   3 months ago Adult general medical exam   [facility] Leavy Mole, PA-C

## 2024-06-27 NOTE — Telephone Encounter (Signed)
 Pt called back and is scheduled for surgery on 8/14 and is not on any blood thinners or glp 1 medications. Pharmacy is correct in chart

## 2024-07-02 ENCOUNTER — Telehealth: Payer: Self-pay | Admitting: Podiatry

## 2024-07-02 NOTE — Telephone Encounter (Signed)
 Pt left message stating she needed to reschedule her surgery that was scheduled for 8/14.   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   I notified Montie at surgery center as well.

## 2024-07-03 LAB — HEPATIC FUNCTION PANEL
ALT: 17 IU/L (ref 0–32)
AST: 18 IU/L (ref 0–40)
Albumin: 4.4 g/dL (ref 3.8–4.9)
Alkaline Phosphatase: 49 IU/L (ref 44–121)
Bilirubin Total: 0.5 mg/dL (ref 0.0–1.2)
Bilirubin, Direct: 0.18 mg/dL (ref 0.00–0.40)
Total Protein: 7 g/dL (ref 6.0–8.5)

## 2024-07-17 ENCOUNTER — Telehealth: Payer: Self-pay | Admitting: Podiatry

## 2024-07-17 NOTE — Telephone Encounter (Signed)
 DOS- 07/25/2024  TARSAL EXOSTECTOMY RT- 71895  AETNA EFFECTIVE DATE- 12/13/2023  DEDUCTIBLE- $1500 REMAINING- $1500 OOP- $5900 REMAINING- $4506.45 COINSURANCE- 50%/$0 COPAY  PER [employer] WEBSITE, NO PRIOR AUTHORIZATION IS REQUIRED FOR CPT CODE 71895. DOCUMENTATION ATTACHED TO SURGERY CONSENT PACKET.

## 2024-08-02 ENCOUNTER — Encounter: Admitting: Podiatry

## 2024-08-09 ENCOUNTER — Encounter: Admitting: Podiatry

## 2024-08-10 ENCOUNTER — Other Ambulatory Visit: Payer: Self-pay | Admitting: Family Medicine

## 2024-08-10 DIAGNOSIS — J301 Allergic rhinitis due to pollen: Secondary | ICD-10-CM

## 2024-08-12 NOTE — Telephone Encounter (Signed)
 Requested Prescriptions  Pending Prescriptions Disp Refills   fluticasone  (FLONASE ) 50 MCG/ACT nasal spray [Pharmacy Med Name: FLUTICASONE  PROP 50 MCG SPRAY] 48 mL 0    Sig: USE 2 SPRAYS IN EACH NOSTRIL ONCE DAILY     Ear, Nose, and Throat: Nasal Preparations - Corticosteroids Passed - 08/12/2024 11:43 AM      Passed - Valid encounter within last 12 months    Recent Outpatient Visits           2 months ago Fungal infection of nail   [facility] Leavy Mole, PA-C   4 months ago Adult general medical exam   [facility] Leavy Mole, PA-C

## 2024-08-23 ENCOUNTER — Encounter: Admitting: Podiatry

## 2024-09-27 ENCOUNTER — Ambulatory Visit: Admitting: Family Medicine

## 2024-10-03 ENCOUNTER — Encounter: Payer: Self-pay | Admitting: Nurse Practitioner

## 2024-10-03 ENCOUNTER — Ambulatory Visit: Admitting: Nurse Practitioner

## 2024-10-03 VITALS — BP 128/80 | HR 79 | Resp 16 | Ht 63.0 in | Wt 182.7 lb

## 2024-10-03 DIAGNOSIS — K219 Gastro-esophageal reflux disease without esophagitis: Secondary | ICD-10-CM

## 2024-10-03 DIAGNOSIS — E782 Mixed hyperlipidemia: Secondary | ICD-10-CM | POA: Diagnosis not present

## 2024-10-03 DIAGNOSIS — Z23 Encounter for immunization: Secondary | ICD-10-CM | POA: Diagnosis not present

## 2024-10-03 DIAGNOSIS — R7303 Prediabetes: Secondary | ICD-10-CM | POA: Diagnosis not present

## 2024-10-03 DIAGNOSIS — J301 Allergic rhinitis due to pollen: Secondary | ICD-10-CM | POA: Diagnosis not present

## 2024-10-03 DIAGNOSIS — Z1231 Encounter for screening mammogram for malignant neoplasm of breast: Secondary | ICD-10-CM

## 2024-10-03 DIAGNOSIS — Z13820 Encounter for screening for osteoporosis: Secondary | ICD-10-CM

## 2024-10-03 DIAGNOSIS — F411 Generalized anxiety disorder: Secondary | ICD-10-CM

## 2024-10-03 NOTE — Assessment & Plan Note (Signed)
 Hemoglobin A1c has remained at 6.0 since 2024. Continue working on limiting sugar in diet and increasing water intake. Discussed rechecking in 6 months.

## 2024-10-03 NOTE — Progress Notes (Signed)
 BP 128/80   Pulse 79   Resp 16   Ht 5' 3 (1.6 m)   Wt 182 lb 11.2 oz (82.9 kg)   LMP 11/13/2010   SpO2 98%   BMI 32.36 kg/m    Subjective:    Patient ID: Stephanie Koch, female    DOB: 10/04/1966, 58 y.o.   MRN: [id number]  HPI: [name] is a 58 y.o. female with a history of seasonal allergic rhinitis, GERD, mixed hyperlipidemia, anxiety, and prediabetes, who presents today for a 6 month chronic medical management follow-up.    Allergic Rhinitis: Taking Flonase  daily for symptoms and reports improvement when using it.   GERD: She has been taking pantoprazole  40 mg for GERD symptoms. She is working on limiting trigger foods, such as acidic and spicy foods.   Hyperlipidemia: She is taking rosuvastatin  5 mg daily and tolerating medication well. Lipid panel 6 months ago was normal. She has been working on limiting saturated fats in diet and trying to exercise more throughout the week.   Prediabetes: Hemoglobin A1c 6 months ago was 6.0 as well as one year ago. Plan to recheck A1c at next visit in 6 months.   Anxiety:  Taking venlafaxine  37.5 mg daily and reports improvement in anxiety symptoms. Denies needing any refills. Endorses feeling in a much better place on the medication.   Bone Spur: Reports bone spur on right foot and has surgery with Podiatry in December.   Health Maintenance: Takes a vitamin D  supplement daily along with calcium  daily. Requesting pneumococcal vaccine today. Received influenza vaccine at work recently.       10/03/2024    3:09 PM 03/27/2024    8:40 AM 09/26/2023    3:32 PM  Depression screen PHQ 2/9  Decreased Interest 0 0 0  Down, Depressed, Hopeless 0 0 0  PHQ - 2 Score 0 0 0  Altered sleeping   0  Tired, decreased energy   0  Change in appetite   0  Feeling bad or failure about yourself    0  Trouble concentrating   0  Moving slowly or fidgety/restless   0  Suicidal thoughts   0  PHQ-9 Score   0  Difficult doing work/chores    Not difficult at all       09/26/2023    3:32 PM 12/09/2015    1:08 PM  GAD 7 : Generalized Anxiety Score  Nervous, Anxious, on Edge 0 1  Control/stop worrying 0 1  Worry too much - different things 0 0  Trouble relaxing 0 0  Restless 0 0  Easily annoyed or irritable 0 0  Afraid - awful might happen 0 1  Total GAD 7 Score 0 3  Anxiety Difficulty Not difficult at all Not difficult at all     Relevant past medical, surgical, family and social history reviewed and updated as indicated. Interim medical history since our last visit reviewed. Allergies and medications reviewed and updated.  Review of Systems  Ten systems reviewed and is negative except as mentioned in HPI       Objective:     BP 128/80   Pulse 79   Resp 16   Ht 5' 3 (1.6 m)   Wt 182 lb 11.2 oz (82.9 kg)   LMP 11/13/2010   SpO2 98%   BMI 32.36 kg/m    Wt Readings from Last 3 Encounters:  10/03/24 182 lb 11.2 oz (82.9 kg)  06/25/24 178 lb (80.7  kg)  06/11/24 178 lb (80.7 kg)    Physical Exam Constitutional:      Appearance: Normal appearance.  HENT:     Head: Normocephalic and atraumatic.  Cardiovascular:     Rate and Rhythm: Normal rate and regular rhythm.     Pulses: Normal pulses.     Heart sounds: Normal heart sounds.  Pulmonary:     Effort: Pulmonary effort is normal.     Breath sounds: Normal breath sounds.  Musculoskeletal:     Cervical back: Normal range of motion and neck supple.  Skin:    General: Skin is warm and dry.  Neurological:     General: No focal deficit present.     Mental Status: She is alert and oriented to person, place, and time.  Psychiatric:        Mood and Affect: Mood normal.        Behavior: Behavior normal.        Thought Content: Thought content normal.        Judgment: Judgment normal.      Results for orders placed or performed in visit on 06/25/24  Hepatic Function Panel   Collection Time: 07/02/24 10:30 AM  Result Value Ref Range   Total Protein  7.0 6.0 - 8.5 g/dL   Albumin 4.4 3.8 - 4.9 g/dL   Bilirubin Total 0.5 0.0 - 1.2 mg/dL   Bilirubin, Direct 9.81 0.00 - 0.40 mg/dL   Alkaline Phosphatase 49 44 - 121 IU/L   AST 18 0 - 40 IU/L   ALT 17 0 - 32 IU/L          Assessment & Plan:   Problem List Items Addressed This Visit       Respiratory   Seasonal allergic rhinitis   Continue taking Flonase  for symptoms.         Digestive   Gastroesophageal reflux disease without esophagitis   Continue taking Protonix  40 mg for GERD. Advised to avoid food triggers.         Other   Mixed hyperlipidemia   Continue taking rosuvastatin  5 mg and working on limiting saturated fats in diet. Lipid panel 6 months ago was normal. Plan to recheck in 6 months.       Anxiety state   Continue taking Effexor  37.5 mg daily.       Pre-diabetes - Primary   Hemoglobin A1c has remained at 6.0 since 2024. Continue working on limiting sugar in diet and increasing water intake. Discussed rechecking in 6 months.       Other Visit Diagnoses       Hypocalcemia       Contnue taking calcium  supplement daily     Encounter for screening mammogram for malignant neoplasm of breast       Mammogram ordered   Relevant Orders   MM 3D SCREENING MAMMOGRAM BILATERAL BREAST     Immunization due       Pneumococcal vaccine given today   Relevant Orders   Pneumococcal conjugate vaccine 20-valent (Prevnar 20) (Completed)     Osteoporosis screening       DEXA scan ordered   Relevant Orders   HM DEXA SCAN (Completed)       Continue with medications as prescribed. Denies needing refills on medications today. Mammogram ordered and DEXA scan. Provided patient with the phone number to schedule Mammogram. Continue with working on lifestyle modifications including diet and exercise. Plan to recheck labs at 6 month follow-up. Follow-up with Podiatry regarding  surgery for bone spur.          Follow up plan: Return in about 6 months (around 04/03/2025) for  follow-up with labs.    I have reviewed this encounter including the documentation in this note and/or discussed this patient with the provider, Aislinn Womack, SNP, I am certifying that I agree with the content of this note as supervising/preceptor nurse practitioner.  Mliss Spray, FNP-C [facility] 10/03/2024, 3:42 PM

## 2024-10-03 NOTE — Assessment & Plan Note (Signed)
 Continue taking Flonase  for symptoms.

## 2024-10-03 NOTE — Assessment & Plan Note (Signed)
 Continue taking rosuvastatin  5 mg and working on limiting saturated fats in diet. Lipid panel 6 months ago was normal. Plan to recheck in 6 months.

## 2024-10-03 NOTE — Assessment & Plan Note (Signed)
 Continue taking Protonix  40 mg for GERD. Advised to avoid food triggers.

## 2024-10-03 NOTE — Assessment & Plan Note (Signed)
 Continue taking Effexor  37.5 mg daily.

## 2024-11-01 ENCOUNTER — Telehealth: Payer: Self-pay | Admitting: Podiatry

## 2024-11-01 NOTE — Telephone Encounter (Signed)
 DOS- 11/28/2024  TARSAL EXOSTECTOMY RT- 71895  AETNA EFFECTIVE DATE- 12/13/2023  DEDUCTIBLE- $1500 REMAINING- $1500 OOP- $5900 REMAINING- $5335.50 COINSURANCE- 30%  PER [employer] PORTAL, NO PRIOR AUTH IS REQUIRED FOR CPT CODE 71895. DOCUMENTATION ATTACHED TO SURGERY CONSENT PACKET.

## 2024-12-03 ENCOUNTER — Encounter: Admitting: Podiatry

## 2024-12-13 ENCOUNTER — Encounter: Admitting: Podiatry

## 2024-12-24 ENCOUNTER — Encounter: Admitting: Podiatry

## 2025-03-18 ENCOUNTER — Ambulatory Visit: Admitting: Podiatry

## 2025-04-11 ENCOUNTER — Ambulatory Visit: Admitting: Family Medicine
